# Patient Record
Sex: Female | Born: 1990 | Race: White | Hispanic: No | Marital: Married | Smoking: Never smoker
Health system: Southern US, Community
[De-identification: ages and names within clinical notes are randomized; demographics above are authoritative.]

## PROBLEM LIST (undated history)

## (undated) ENCOUNTER — Emergency Department (HOSPITAL_COMMUNITY): Admission: EM | Discharge status: Absent without leave | Payer: 59

## (undated) DIAGNOSIS — D649 Anemia, unspecified: Secondary | ICD-10-CM

## (undated) DIAGNOSIS — Z9889 Other specified postprocedural states: Secondary | ICD-10-CM

## (undated) DIAGNOSIS — R112 Nausea with vomiting, unspecified: Secondary | ICD-10-CM

## (undated) DIAGNOSIS — N809 Endometriosis, unspecified: Secondary | ICD-10-CM

## (undated) DIAGNOSIS — K219 Gastro-esophageal reflux disease without esophagitis: Secondary | ICD-10-CM

## (undated) DIAGNOSIS — I10 Essential (primary) hypertension: Secondary | ICD-10-CM

## (undated) DIAGNOSIS — R7303 Prediabetes: Secondary | ICD-10-CM

## (undated) DIAGNOSIS — E119 Type 2 diabetes mellitus without complications: Secondary | ICD-10-CM

## (undated) DIAGNOSIS — F419 Anxiety disorder, unspecified: Secondary | ICD-10-CM

## (undated) DIAGNOSIS — J189 Pneumonia, unspecified organism: Secondary | ICD-10-CM

## (undated) DIAGNOSIS — F909 Attention-deficit hyperactivity disorder, unspecified type: Secondary | ICD-10-CM

## (undated) DIAGNOSIS — R000 Tachycardia, unspecified: Secondary | ICD-10-CM

## (undated) DIAGNOSIS — T8859XA Other complications of anesthesia, initial encounter: Secondary | ICD-10-CM

## (undated) DIAGNOSIS — Z87442 Personal history of urinary calculi: Secondary | ICD-10-CM

## (undated) DIAGNOSIS — J45909 Unspecified asthma, uncomplicated: Secondary | ICD-10-CM

## (undated) HISTORY — PX: TYMPANOSTOMY TUBE PLACEMENT: SHX32

## (undated) HISTORY — DX: Essential (primary) hypertension: I10

## (undated) HISTORY — PX: CHOLECYSTECTOMY: SHX55

---
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
## 2011-08-09 DIAGNOSIS — F32A Depression, unspecified: Secondary | ICD-10-CM | POA: Insufficient documentation

## 2017-09-26 DIAGNOSIS — J45901 Unspecified asthma with (acute) exacerbation: Secondary | ICD-10-CM

## 2017-09-26 DIAGNOSIS — J9601 Acute respiratory failure with hypoxia: Secondary | ICD-10-CM

## 2017-09-27 DIAGNOSIS — F419 Anxiety disorder, unspecified: Secondary | ICD-10-CM

## 2017-09-27 DIAGNOSIS — J209 Acute bronchitis, unspecified: Secondary | ICD-10-CM

## 2018-01-10 ENCOUNTER — Emergency Department (HOSPITAL_COMMUNITY)
Admission: EM | Admit: 2018-01-10 | Discharge: 2018-01-10 | Disposition: A | Discharge status: Home / Self Care | Payer: Self-pay | Attending: Emergency Medicine | Admitting: Emergency Medicine

## 2018-01-10 ENCOUNTER — Other Ambulatory Visit: Payer: Self-pay

## 2018-01-10 ENCOUNTER — Encounter (HOSPITAL_COMMUNITY): Payer: Self-pay | Admitting: *Deleted

## 2018-01-10 ENCOUNTER — Emergency Department (HOSPITAL_COMMUNITY): Payer: Self-pay

## 2018-01-10 DIAGNOSIS — N200 Calculus of kidney: Secondary | ICD-10-CM | POA: Insufficient documentation

## 2018-01-10 DIAGNOSIS — R062 Wheezing: Secondary | ICD-10-CM | POA: Insufficient documentation

## 2018-01-10 HISTORY — DX: Unspecified asthma, uncomplicated: J45.909

## 2018-01-10 HISTORY — DX: Tachycardia, unspecified: R00.0

## 2018-01-10 HISTORY — DX: Endometriosis, unspecified: N80.9

## 2018-01-10 LAB — URINALYSIS, ROUTINE W REFLEX MICROSCOPIC
Bilirubin Urine: NEGATIVE
Glucose, UA: NEGATIVE mg/dL
Ketones, ur: NEGATIVE mg/dL
Nitrite: NEGATIVE
PH: 5 (ref 5.0–8.0)
Protein, ur: NEGATIVE mg/dL
SPECIFIC GRAVITY, URINE: 1.03 (ref 1.005–1.030)

## 2018-01-10 LAB — POC URINE PREG, ED: PREG TEST UR: NEGATIVE

## 2018-01-10 MED ORDER — PREDNISONE 20 MG PO TABS
60.0000 mg | ORAL_TABLET | Freq: Once | ORAL | Status: AC
  Administered 2018-01-10: 60 mg via ORAL
  Filled 2018-01-10: qty 3

## 2018-01-10 MED ORDER — ALBUTEROL SULFATE HFA 108 (90 BASE) MCG/ACT IN AERS
2.0000 | INHALATION_SPRAY | Freq: Once | RESPIRATORY_TRACT | Status: AC
  Administered 2018-01-10: 2 via RESPIRATORY_TRACT
  Filled 2018-01-10: qty 6.7

## 2018-01-10 MED ORDER — KETOROLAC TROMETHAMINE 30 MG/ML IJ SOLN
15.0000 mg | Freq: Once | INTRAMUSCULAR | Status: AC
  Administered 2018-01-10: 15 mg via INTRAMUSCULAR
  Filled 2018-01-10: qty 1

## 2018-01-10 MED ORDER — TRAMADOL HCL 50 MG PO TABS: 50.0000 mg | ORAL_TABLET | Freq: Four times a day (QID) | ORAL | 0 refills | Status: DC | PRN

## 2018-01-10 MED ORDER — PREDNISONE 20 MG PO TABS: 40.0000 mg | ORAL_TABLET | Freq: Every day | ORAL | 0 refills | Status: DC

## 2018-01-10 MED ORDER — ALBUTEROL SULFATE (2.5 MG/3ML) 0.083% IN NEBU
5.0000 mg | INHALATION_SOLUTION | Freq: Once | RESPIRATORY_TRACT | Status: AC
  Administered 2018-01-10: 5 mg via RESPIRATORY_TRACT
  Filled 2018-01-10: qty 6

## 2018-01-10 NOTE — ED Notes (Signed)
Pt verbalized understanding of d/c instructions and has no further questions. VSS, NAD. Pt removed all belongings.  

## 2018-01-10 NOTE — ED Provider Notes (Signed)
[facility] EMERGENCY DEPARTMENT Provider Note   CSN: [id number] Arrival date & time: 01/10/18  0709     History   Chief Complaint Chief Complaint  Patient presents with  . Back Pain  . Dysuria    HPI Tina Manning is a 27 y.o. female.  HPI Patient presents with concern of right flank pain. There is associated dysuria, nausea, vomiting. Onset was about 1-1/2 days ago, possibly 2. Prior to the onset of symptoms, the patient was in her usual state of health. She acknowledges multiple medical issues including asthma, endometriosis. Her menstrual cycle is irregular, regularly. She is unable to specify when her last period began and stopped. However, she states this is not unusual.  Prior to the onset of symptoms, the patient has not had similar flank pain in some time, though she does acknowledge a history of prior kidney stone and kidney infection, states that these symptoms are similar.  On review of systems, patient also describes mild dyspnea, states that she ran out of her albuterol inhaler. She denies fever, chest pain. She is here with her father who assists with the HPI. Past Medical History:  Diagnosis Date  . Asthma   . Endometriosis   . Tachycardia     There are no active problems to display for this patient.   Past Surgical History:  Procedure Laterality Date  . CHOLECYSTECTOMY      OB History    No data available       Home Medications    Prior to Admission medications   Not on File    Family History No family history on file.  Social History Social History   Tobacco Use  . Smoking status: Not on file  Substance Use Topics  . Alcohol use: Not on file  . Drug use: Not on file     Allergies   Penicillins   Review of Systems Review of Systems  Constitutional:       Per HPI, otherwise negative  HENT:       Per HPI, otherwise negative  Respiratory:       Per HPI, otherwise negative    Cardiovascular:       Per HPI, otherwise negative  Gastrointestinal: Positive for nausea and vomiting.  Endocrine:       Negative aside from HPI  Genitourinary:       Neg aside from HPI   Musculoskeletal:       Per HPI, otherwise negative  Skin: Negative.   Neurological: Negative for syncope.     Physical Exam Updated Vital Signs BP (!) 149/102 (BP Location: Right Arm)   Pulse (!) 122   Temp 98.7 F (37.1 C) (Oral)   Resp 18   LMP 01/08/2018 (Exact Date)   SpO2 98%   Physical Exam  Constitutional: She is oriented to person, place, and time. She appears well-developed and well-nourished. No distress.  Obese young female awake and alert sitting upright  HENT:  Head: Normocephalic and atraumatic.  Eyes: Conjunctivae and EOM are normal.  Cardiovascular: Regular rhythm. Tachycardia present.  Pulmonary/Chest: No stridor. She has decreased breath sounds. She has wheezes.  Abdominal: She exhibits no distension.  Right flank tenderness, otherwise no abdominal pain  Musculoskeletal: She exhibits no edema.  Neurological: She is alert and oriented to person, place, and time. No cranial nerve deficit.  Skin: Skin is warm and dry.  Psychiatric: She has a normal mood and affect.  Nursing note and vitals reviewed.  ED Treatments / Results  Labs (all labs ordered are listed, but only abnormal results are displayed) Labs Reviewed  URINALYSIS, ROUTINE W REFLEX MICROSCOPIC - Abnormal; Notable for the following components:      Result Value   APPearance HAZY (*)    Hgb urine dipstick LARGE (*)    Leukocytes, UA TRACE (*)    Bacteria, UA FEW (*)    Squamous Epithelial / LPF 0-5 (*)    All other components within normal limits  POC URINE PREG, ED    Radiology [place] Renal  Result Date: 01/10/2018 CLINICAL DATA:  Right flank pain. EXAM: RENAL / URINARY TRACT ULTRASOUND COMPLETE COMPARISON:  08/01/2011. FINDINGS: Right Kidney: Length: 12.2 cm. Echogenicity within normal limits. No  mass or hydronephrosis visualized. Left Kidney: Length: 12.1 cm. Echogenicity within normal limits. No mass or hydronephrosis visualized. Bladder: Appears normal for degree of bladder distention. Limited exam due to patient's body habitus. IMPRESSION: No acute or focal abnormality identified. Electronically Signed   By: Maisie Fushomas  Register   On: 01/10/2018 10:59    Procedures Procedures (including critical care time)  Medications Ordered in ED Medications  ketorolac (TORADOL) 30 MG/ML injection 15 mg (15 mg Intramuscular Given 01/10/18 0840)  albuterol (PROVENTIL) (2.5 MG/3ML) 0.083% nebulizer solution 5 mg (5 mg Nebulization Given 01/10/18 0840)  albuterol (PROVENTIL HFA;VENTOLIN HFA) 108 (90 Base) MCG/ACT inhaler 2 puff (2 puffs Inhalation Given 01/10/18 0840)  predniSONE (DELTASONE) tablet 60 mg (60 mg Oral Given 01/10/18 0839)     Initial Impression / Assessment and Plan / ED Course  I have reviewed the triage vital signs and the nursing notes.  Pertinent labs & imaging results that were available during my care of the patient were reviewed by me and considered in my medical decision making (see chart for details).    11:25 AM Patient awake alert, in no distress per Patient with her about findings, including evidence for kidney stone, particularly given her history, but no evidence for obstruction or infection. Patient has diminished pain, no new complaints, no fever, and remains hemodynamically unremarkable aside from mild tachycardia. Patient will follow up with her primary care physician, voices understanding of return precautions.   Final Clinical Impressions(s) / ED Diagnoses  Kidney stone Asthma exacerbation   Gerhard MunchLockwood, Liylah Najarro, MD 01/10/18 1126

## 2018-01-10 NOTE — ED Triage Notes (Signed)
Pt is here with right lower back pain for 1.5days and reports pain with urination.  Pt reports nausea and vomiting X1 last night.  LMP 2 days ago. Pt states pain with movement

## 2018-01-10 NOTE — Discharge Instructions (Signed)
As discussed, your evaluation today has been largely reassuring.  But, it is important that you monitor your condition carefully, and do not hesitate to return to the ED if you develop new, or concerning changes in your condition. ? ?Otherwise, please follow-up with your physician for appropriate ongoing care. ? ?

## 2018-02-27 ENCOUNTER — Emergency Department (HOSPITAL_COMMUNITY)
Admission: EM | Admit: 2018-02-27 | Discharge: 2018-02-27 | Disposition: A | Discharge status: Home / Self Care | Payer: Medicaid Other | Attending: Emergency Medicine | Admitting: Emergency Medicine

## 2018-02-27 ENCOUNTER — Emergency Department (HOSPITAL_COMMUNITY): Payer: Medicaid Other

## 2018-02-27 ENCOUNTER — Encounter (HOSPITAL_COMMUNITY): Payer: Self-pay | Admitting: Emergency Medicine

## 2018-02-27 ENCOUNTER — Other Ambulatory Visit: Payer: Self-pay

## 2018-02-27 DIAGNOSIS — H6642 Suppurative otitis media, unspecified, left ear: Secondary | ICD-10-CM | POA: Insufficient documentation

## 2018-02-27 DIAGNOSIS — H66002 Acute suppurative otitis media without spontaneous rupture of ear drum, left ear: Secondary | ICD-10-CM

## 2018-02-27 DIAGNOSIS — J45901 Unspecified asthma with (acute) exacerbation: Secondary | ICD-10-CM

## 2018-02-27 LAB — CBC WITH DIFFERENTIAL/PLATELET
BASOS PCT: 0 %
Basophils Absolute: 0 10*3/uL (ref 0.0–0.1)
EOS ABS: 0.4 10*3/uL (ref 0.0–0.7)
EOS PCT: 3 %
HCT: 39.8 % (ref 36.0–46.0)
HEMOGLOBIN: 12.7 g/dL (ref 12.0–15.0)
LYMPHS ABS: 2.5 10*3/uL (ref 0.7–4.0)
Lymphocytes Relative: 22 %
MCH: 27.7 pg (ref 26.0–34.0)
MCHC: 31.9 g/dL (ref 30.0–36.0)
MCV: 86.9 fL (ref 78.0–100.0)
Monocytes Absolute: 0.6 10*3/uL (ref 0.1–1.0)
Monocytes Relative: 5 %
Neutro Abs: 7.6 10*3/uL (ref 1.7–7.7)
Neutrophils Relative %: 70 %
PLATELETS: 376 10*3/uL (ref 150–400)
RBC: 4.58 MIL/uL (ref 3.87–5.11)
RDW: 14.6 % (ref 11.5–15.5)
WBC: 11.1 10*3/uL — AB (ref 4.0–10.5)

## 2018-02-27 LAB — BASIC METABOLIC PANEL
Anion gap: 12 (ref 5–15)
BUN: 13 mg/dL (ref 6–20)
CO2: 23 mmol/L (ref 22–32)
CREATININE: 0.74 mg/dL (ref 0.44–1.00)
Calcium: 9 mg/dL (ref 8.9–10.3)
Chloride: 107 mmol/L (ref 101–111)
Glucose, Bld: 103 mg/dL — ABNORMAL HIGH (ref 65–99)
Potassium: 3.6 mmol/L (ref 3.5–5.1)
SODIUM: 142 mmol/L (ref 135–145)

## 2018-02-27 LAB — I-STAT CG4 LACTIC ACID, ED: LACTIC ACID, VENOUS: 1.14 mmol/L (ref 0.5–1.9)

## 2018-02-27 MED ORDER — ALBUTEROL SULFATE (2.5 MG/3ML) 0.083% IN NEBU
5.0000 mg | INHALATION_SOLUTION | Freq: Once | RESPIRATORY_TRACT | Status: AC
  Administered 2018-02-27: 5 mg via RESPIRATORY_TRACT
  Filled 2018-02-27: qty 6

## 2018-02-27 MED ORDER — PREDNISONE 20 MG PO TABS
60.0000 mg | ORAL_TABLET | Freq: Once | ORAL | Status: AC
  Administered 2018-02-27: 60 mg via ORAL
  Filled 2018-02-27: qty 3

## 2018-02-27 MED ORDER — AZITHROMYCIN 250 MG PO TABS: 250.0000 mg | ORAL_TABLET | Freq: Every day | ORAL | 0 refills | Status: DC

## 2018-02-27 MED ORDER — PREDNISONE 20 MG PO TABS: 60.0000 mg | ORAL_TABLET | Freq: Every day | ORAL | 0 refills | Status: DC

## 2018-02-27 MED ORDER — ALBUTEROL SULFATE HFA 108 (90 BASE) MCG/ACT IN AERS: 1.0000 | INHALATION_SPRAY | Freq: Four times a day (QID) | RESPIRATORY_TRACT | 0 refills | Status: DC | PRN

## 2018-02-27 MED ORDER — ALBUTEROL SULFATE (2.5 MG/3ML) 0.083% IN NEBU
2.5000 mg | INHALATION_SOLUTION | Freq: Once | RESPIRATORY_TRACT | Status: AC
  Administered 2018-02-27: 2.5 mg via RESPIRATORY_TRACT
  Filled 2018-02-27: qty 3

## 2018-02-27 MED ORDER — CETIRIZINE-PSEUDOEPHEDRINE ER 5-120 MG PO TB12: 1.0000 | ORAL_TABLET | Freq: Every day | ORAL | 0 refills | Status: DC

## 2018-02-27 MED ORDER — BENZONATATE 100 MG PO CAPS: 100.0000 mg | ORAL_CAPSULE | Freq: Three times a day (TID) | ORAL | 0 refills | Status: DC

## 2018-02-27 NOTE — ED Notes (Signed)
Walked patient around the nursing station patient did ok oxygen level stayed at 100 then went down to 91 as she walked

## 2018-02-27 NOTE — ED Provider Notes (Signed)
[facility] EMERGENCY DEPARTMENT Provider Note   CSN: [id number] Arrival date & time: 02/27/18  0701     History   Chief Complaint Chief Complaint  Patient presents with  . Influenza    HPI Tina Manning is a 27 y.o. female with history of asthma who presents with a 3-day history of cough, sore throat, left ear pain, and shortness of breath with exertion.  Patient has had wheezing and shortness of breath unresolved with her inhaler at home.  She has been taking over-the-counter cough medications without significant relief.  She denies any abdominal pain, nausea, vomiting.  She reports she has felt hot and cold for the past few days, but no documented fever at home.  HPI  Past Medical History:  Diagnosis Date  . Asthma   . Endometriosis   . Tachycardia     There are no active problems to display for this patient.   Past Surgical History:  Procedure Laterality Date  . CHOLECYSTECTOMY      OB History    No data available       Home Medications    Prior to Admission medications   Medication Sig Start Date End Date Taking? Authorizing Provider  albuterol (PROVENTIL HFA;VENTOLIN HFA) 108 (90 Base) MCG/ACT inhaler Inhale 1-2 puffs into the lungs every 6 (six) hours as needed for wheezing or shortness of breath. 02/27/18   Devian Bartolomei, Waylan Boga, PA-C  azithromycin (ZITHROMAX) 250 MG tablet Take 1 tablet (250 mg total) by mouth daily. Take first 2 tablets together, then 1 every day until finished. 02/27/18   Skip Litke, Waylan Boga, PA-C  benzonatate (TESSALON) 100 MG capsule Take 1 capsule (100 mg total) by mouth every 8 (eight) hours. 02/27/18   Lord Lancour, Waylan Boga, PA-C  cetirizine-pseudoephedrine (ZYRTEC-D) 5-120 MG tablet Take 1 tablet by mouth daily. 02/27/18   Audyn Dimercurio, Waylan Boga, PA-C  predniSONE (DELTASONE) 20 MG tablet Take 3 tablets (60 mg total) by mouth daily. 02/27/18   Tyler Robidoux, Waylan Boga, PA-C  traMADol (ULTRAM) 50 MG tablet Take 1 tablet (50 mg total) by mouth  every 6 (six) hours as needed. 01/10/18   Gerhard Munch, MD    Family History History reviewed. No pertinent family history.  Social History Social History   Tobacco Use  . Smoking status: Never Smoker  . Smokeless tobacco: Never Used  Substance Use Topics  . Alcohol use: No    Frequency: Never  . Drug use: No     Allergies   Penicillins   Review of Systems Review of Systems  Constitutional: Positive for chills. Negative for fever.  HENT: Positive for ear pain and sore throat. Negative for facial swelling.   Respiratory: Positive for cough, shortness of breath and wheezing.   Cardiovascular: Negative for chest pain.  Gastrointestinal: Negative for abdominal pain, nausea and vomiting.  Genitourinary: Negative for dysuria.  Musculoskeletal: Negative for back pain.  Skin: Negative for rash and wound.  Neurological: Negative for headaches.  Psychiatric/Behavioral: The patient is not nervous/anxious.      Physical Exam Updated Vital Signs BP (!) 143/81   Pulse 100   Temp 98.1 F (36.7 C) (Oral)   Resp 14   Ht 5\' 6"  (1.676 m)   Wt (!) 137 kg (302 lb)   LMP 02/26/2018   SpO2 98%   BMI 48.74 kg/m   Physical Exam  Constitutional: She appears well-developed and well-nourished. No distress.  HENT:  Head: Normocephalic and atraumatic.  Right Ear: Tympanic membrane normal.  No mastoid tenderness.  Left Ear: No mastoid tenderness. Tympanic membrane is injected, erythematous and bulging (mild).  Mouth/Throat: Mucous membranes are normal. No trismus in the jaw. Posterior oropharyngeal edema and posterior oropharyngeal erythema present. No oropharyngeal exudate or tonsillar abscesses. Tonsils are 1+ on the right. Tonsils are 1+ on the left. No tonsillar exudate.  Eyes: Conjunctivae are normal. Pupils are equal, round, and reactive to light. Right eye exhibits no discharge. Left eye exhibits no discharge. No scleral icterus.  Neck: Normal range of motion. Neck supple. No  thyromegaly present.  Cardiovascular: Normal rate, regular rhythm, normal heart sounds and intact distal pulses. Exam reveals no gallop and no friction rub.  No murmur heard. Pulmonary/Chest: Effort normal. No stridor. No respiratory distress. She has wheezes. She has no rales.  Inspiratory and expiratory wheezes bilaterally  Abdominal: Soft. Bowel sounds are normal. She exhibits no distension. There is no tenderness. There is no rebound and no guarding.  Musculoskeletal: She exhibits no edema.  Lymphadenopathy:    She has cervical adenopathy (L, moble < 1 cm).  Neurological: She is alert. Coordination normal.  Skin: Skin is warm and dry. No rash noted. She is not diaphoretic. No pallor.  Psychiatric: She has a normal mood and affect.  Nursing note and vitals reviewed.    ED Treatments / Results  Labs (all labs ordered are listed, but only abnormal results are displayed) Labs Reviewed  CBC WITH DIFFERENTIAL/PLATELET - Abnormal; Notable for the following components:      Result Value   WBC 11.1 (*)    All other components within normal limits  BASIC METABOLIC PANEL - Abnormal; Notable for the following components:   Glucose, Bld 103 (*)    All other components within normal limits  I-STAT CG4 LACTIC ACID, ED    EKG  EKG Interpretation None       Radiology Dg Chest 2 View  Result Date: 02/27/2018 CLINICAL DATA:  Flu like symptoms, shortness of breath, sore throat, and ear pain for the past week. History of asthma. EXAM: CHEST - 2 VIEW COMPARISON:  Chest x-ray of September 21, 2017 and chest CT scan of September 26, 2017. FINDINGS: The lungs are mildly hypoinflated. There is no focal infiltrate. There is no pleural effusion. The heart and pulmonary vascularity are normal. The mediastinum is normal in width. The bony thorax exhibits no acute abnormality. IMPRESSION: There is no active cardiopulmonary disease. Electronically Signed   By: David  [place] M.D.   On: 02/27/2018 07:59     Procedures Procedures (including critical care time)  Medications Ordered in ED Medications  albuterol (PROVENTIL) (2.5 MG/3ML) 0.083% nebulizer solution 2.5 mg (2.5 mg Nebulization Given 02/27/18 0715)  albuterol (PROVENTIL) (2.5 MG/3ML) 0.083% nebulizer solution 5 mg (5 mg Nebulization Given 02/27/18 1012)  predniSONE (DELTASONE) tablet 60 mg (60 mg Oral Given 02/27/18 1012)     Initial Impression / Assessment and Plan / ED Course  I have reviewed the triage vital signs and the nursing notes.  Pertinent labs & imaging results that were available during my care of the patient were reviewed by me and considered in my medical decision making (see chart for details).     Patient with suspected viral URI with asthma exacerbation.  Patient also has left otitis media.  Mild leukocytosis, 11.1.  Otherwise, labs unremarkable.  Chest x-ray is negative.  Patient's wheezing is resolved after 2 albuterol nebulizer treatments in the ED.  She is ambulatory without significant shortness of breath and oxygen  saturations above 90%.   Will discharge home with 5-day prednisone burst, first dose given in the ED, as well as azithromycin for otitis media, as patient is allergic to penicillin.  Patient advised to follow-up with her PCP for recheck.  Return precautions discussed.  Patient understands and agrees with plan.  Patient vitals stable and discharged in satisfactory condition.  Final Clinical Impressions(s) / ED Diagnoses   Final diagnoses:  Exacerbation of asthma, unspecified asthma severity, unspecified whether persistent  Acute suppurative otitis media of left ear without spontaneous rupture of tympanic membrane, recurrence not specified    ED Discharge Orders        Ordered    predniSONE (DELTASONE) 20 MG tablet  Daily     02/27/18 1246    benzonatate (TESSALON) 100 MG capsule  Every 8 hours     02/27/18 1246    albuterol (PROVENTIL HFA;VENTOLIN HFA) 108 (90 Base) MCG/ACT inhaler  Every 6  hours PRN     02/27/18 1246    cetirizine-pseudoephedrine (ZYRTEC-D) 5-120 MG tablet  Daily     02/27/18 1246    azithromycin (ZITHROMAX) 250 MG tablet  Daily     02/27/18 1246       Emi HolesLaw, Saulo Anthis M, PA-C 02/27/18 1319    Cathren LaineSteinl, Kevin, MD 02/27/18 1354

## 2018-02-27 NOTE — ED Triage Notes (Signed)
Pt c/o flu like symptoms for the past 3 days with sore throat, ear pain and SOB getting worse today, pt using inhaler with no relief.

## 2018-02-27 NOTE — Discharge Instructions (Signed)
Medications: Prednisone, azithromycin, Zyrtec-D, Tessalon  Treatment: Take prednisone once daily for 4 days.  Take azithromycin once daily for 5 days. Take Zyrtec-D twice daily as needed for nasal and ear congestion. Take Tessalon every 8 hours as needed for cough.  Follow-up: Please follow-up with your PCP for recheck in 3-4 days.  Please return to the emergency department if you develop any new or worsening symptoms including significant worsening of shortness of breath, persistent fever over 100.4, severe chest pain, or any other new or concerning symptom.

## 2018-04-10 ENCOUNTER — Emergency Department (HOSPITAL_COMMUNITY): Payer: Self-pay

## 2018-04-10 ENCOUNTER — Emergency Department (HOSPITAL_COMMUNITY)
Admission: EM | Admit: 2018-04-10 | Discharge: 2018-04-10 | Disposition: A | Discharge status: Home / Self Care | Payer: Self-pay | Attending: Emergency Medicine | Admitting: Emergency Medicine

## 2018-04-10 ENCOUNTER — Encounter (HOSPITAL_COMMUNITY): Payer: Self-pay | Admitting: *Deleted

## 2018-04-10 ENCOUNTER — Other Ambulatory Visit: Payer: Self-pay

## 2018-04-10 DIAGNOSIS — Z5321 Procedure and treatment not carried out due to patient leaving prior to being seen by health care provider: Secondary | ICD-10-CM | POA: Insufficient documentation

## 2018-04-10 DIAGNOSIS — R062 Wheezing: Secondary | ICD-10-CM | POA: Insufficient documentation

## 2018-04-10 LAB — I-STAT BETA HCG BLOOD, ED (MC, WL, AP ONLY)

## 2018-04-10 NOTE — ED Triage Notes (Signed)
The pt is c/o  Loosing her inhaler  And she has chest tightness.  She is presently   Hyperventilating  lmp  now

## 2018-04-10 NOTE — ED Notes (Signed)
Unable to locate pt x 3

## 2018-04-10 NOTE — ED Notes (Signed)
No audible wheezes

## 2019-03-28 IMAGING — CR DG CHEST 2V
2 series · 2 of 2 positions shown · non-contrast
Comparison: Chest x-ray of September 21, 2017 and chest CT scan of
September 26, 2017.

CLINICAL DATA: Flu like symptoms, shortness of breath, sore throat,
and ear pain for the past week. History of asthma.

EXAM:
CHEST - 2 VIEW

[chest pa]
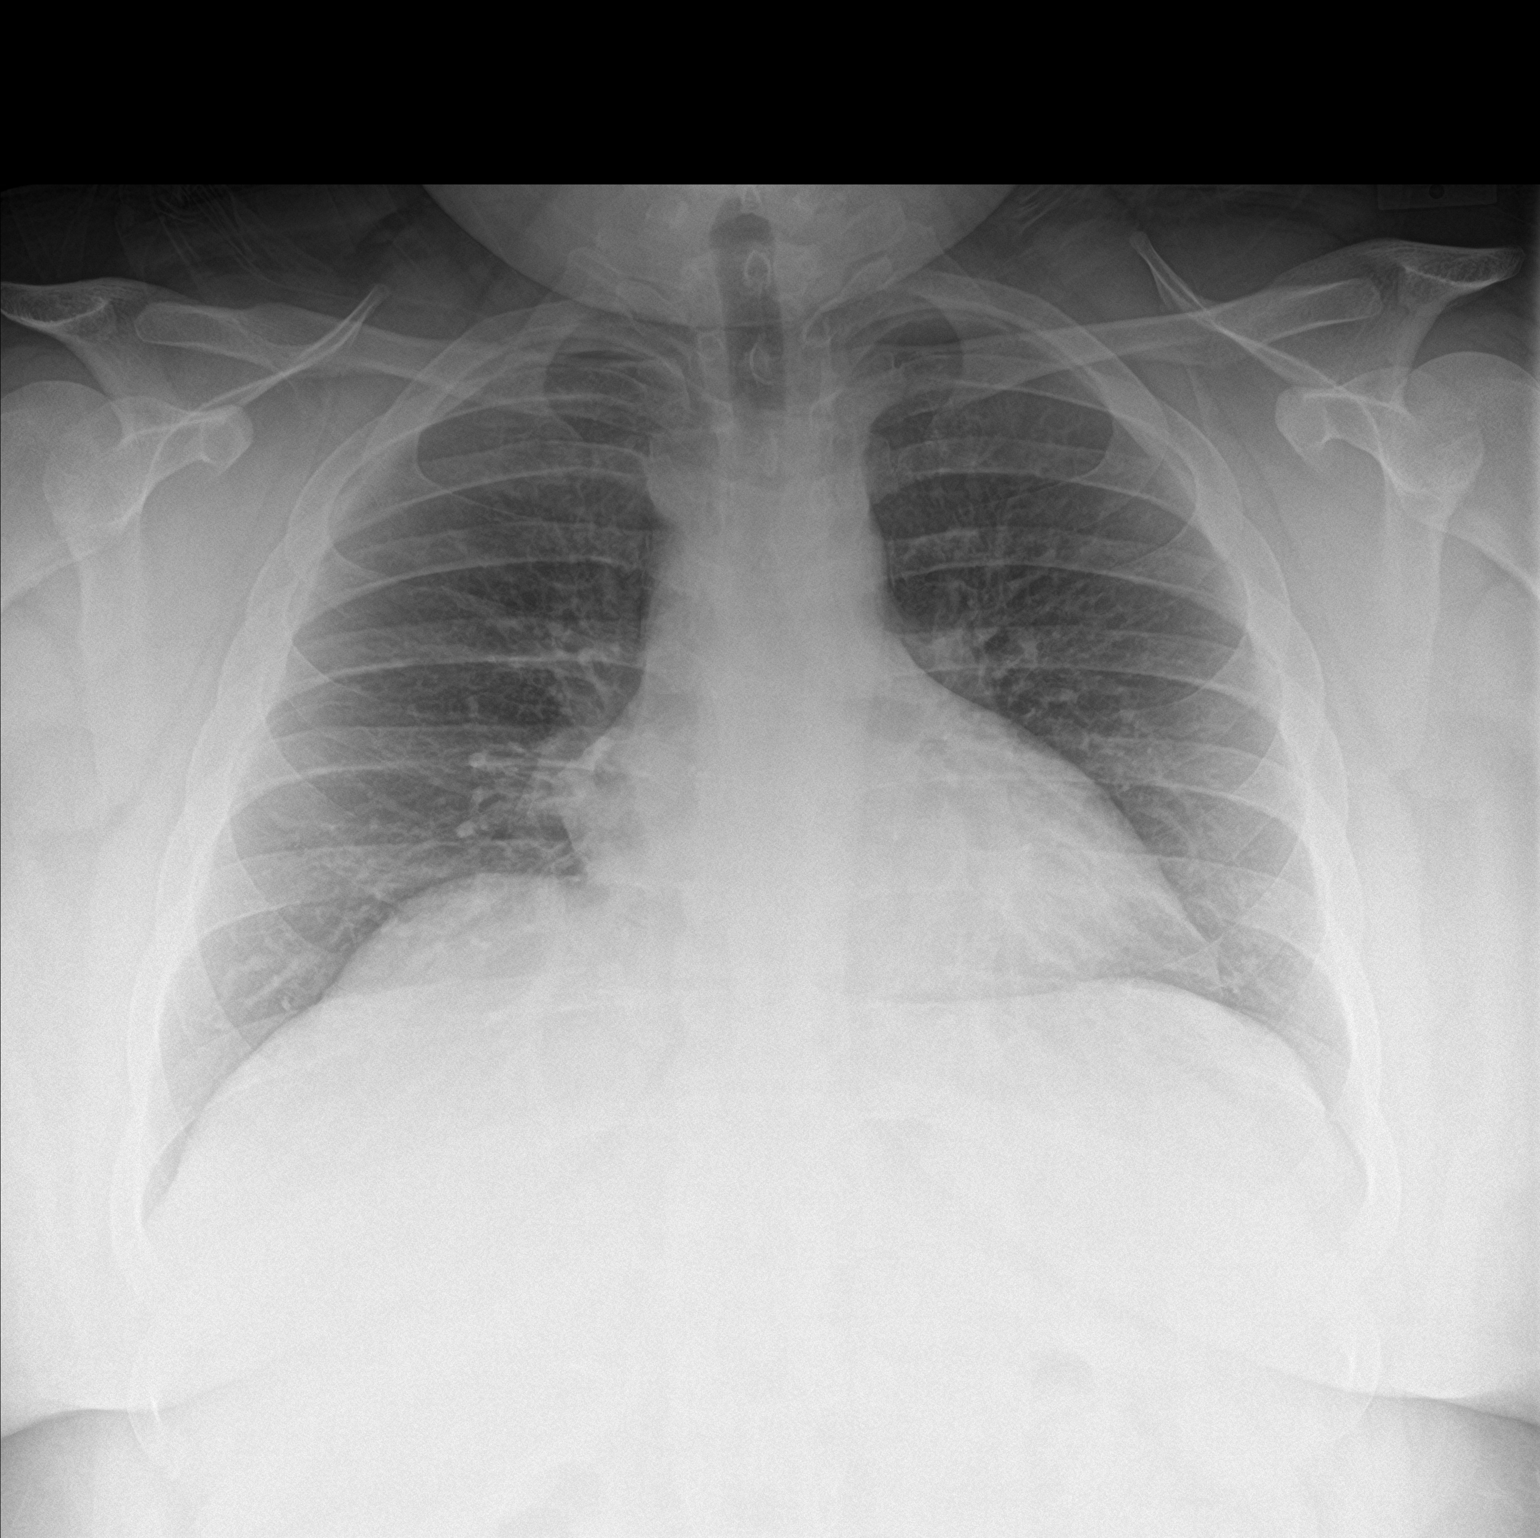

[chest lat]
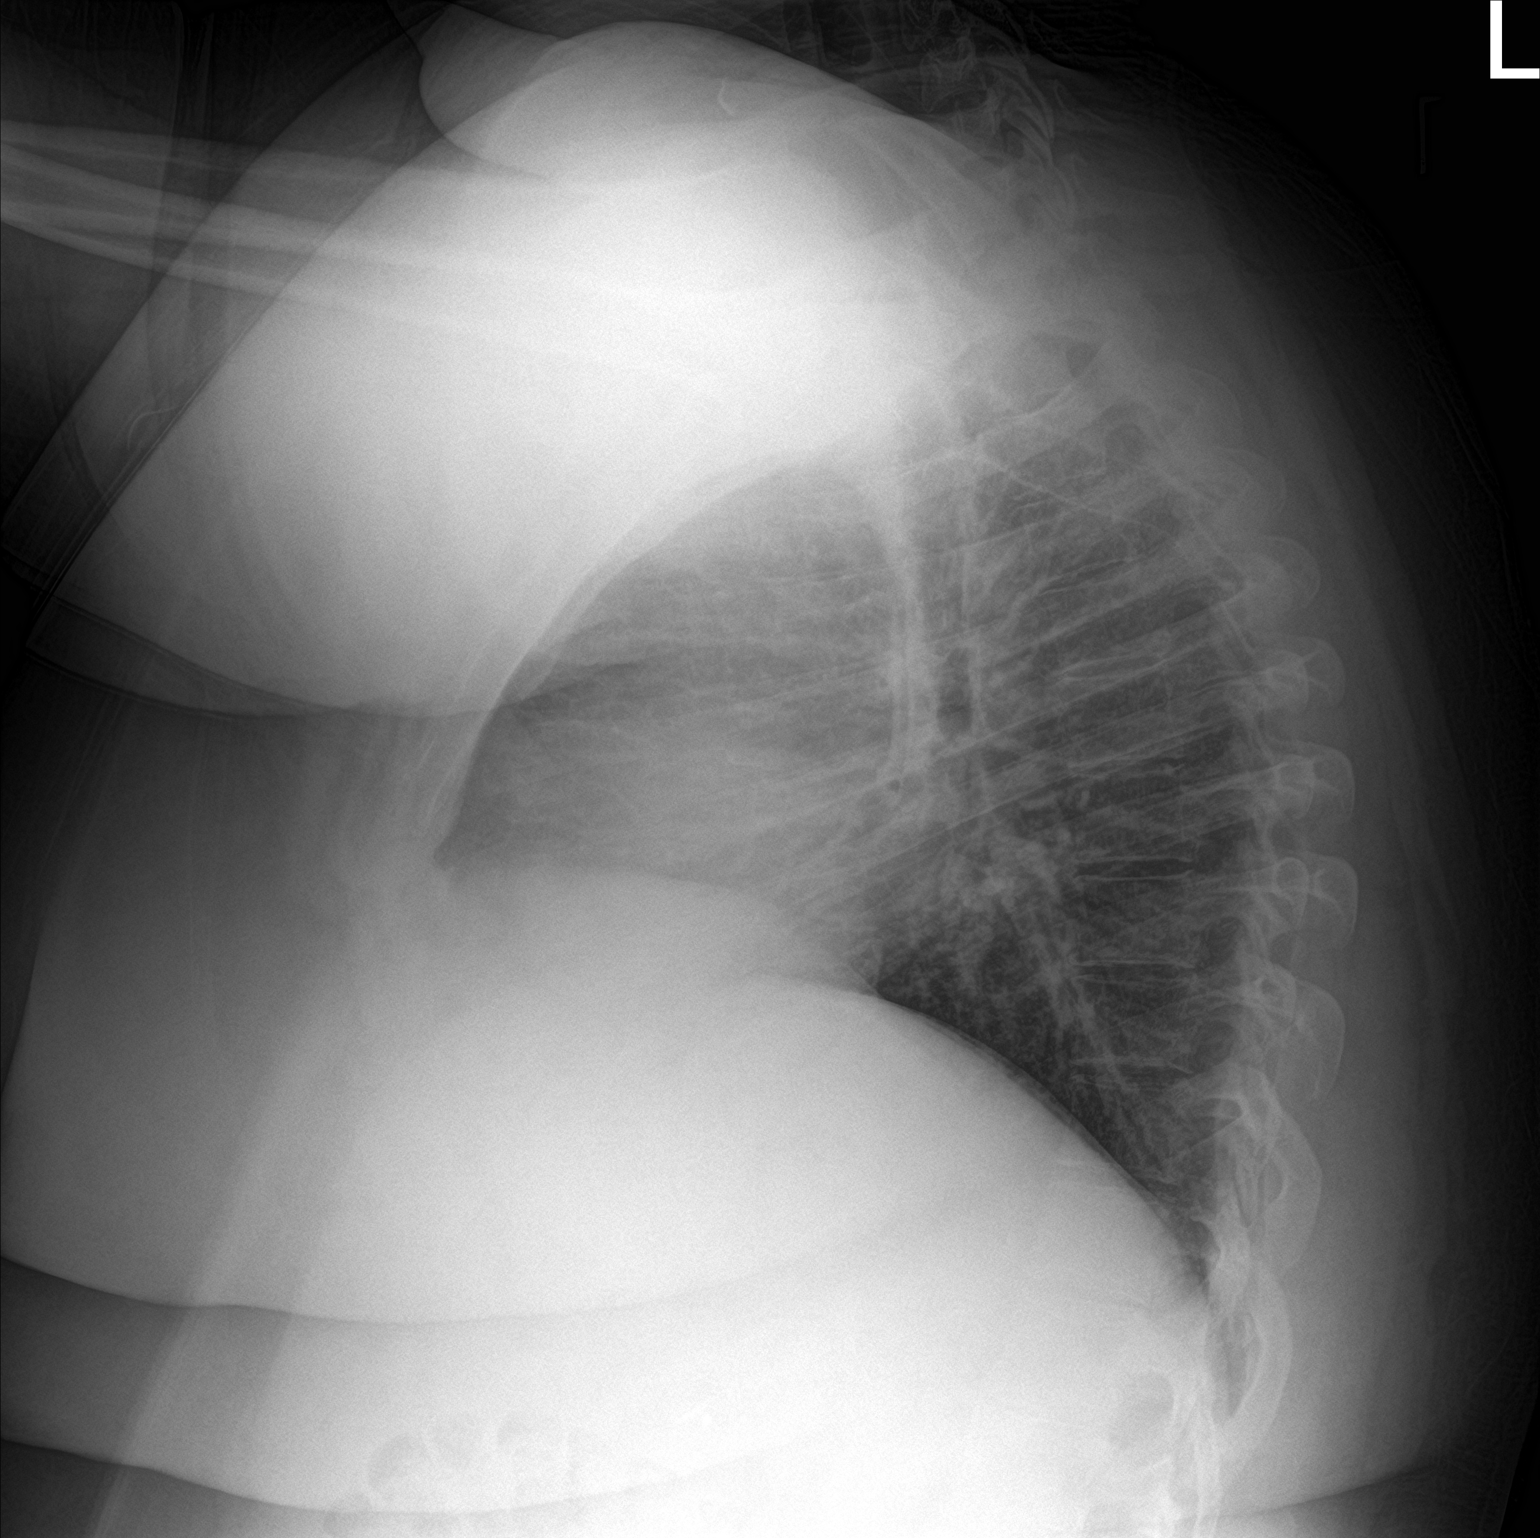

[2 of 2 positions shown; findings below may reference images not displayed]

FINDINGS: The lungs are mildly hypoinflated. There is no focal infiltrate.
There is no pleural effusion. The heart and pulmonary vascularity
are normal. The mediastinum is normal in width. The bony thorax
exhibits no acute abnormality.
IMPRESSION: There is no active cardiopulmonary disease.

## 2019-04-13 IMAGING — US US RENAL
1 series · 14 of 25 positions shown · non-contrast
Comparison: 08/01/2011.

CLINICAL DATA: Right flank pain.

EXAM:
RENAL / URINARY TRACT ULTRASOUND COMPLETE

[Series 1: us renal · 0.27mm/px · 14 of 27 slices shown]
[im 1/27]
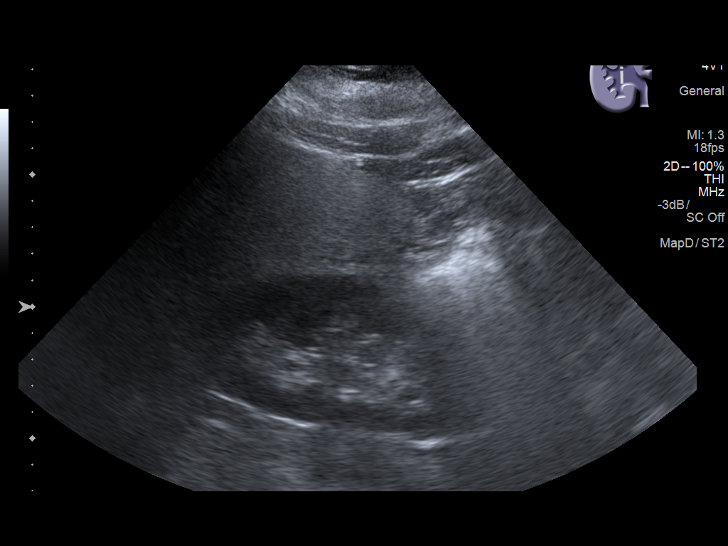
[im 3/27]
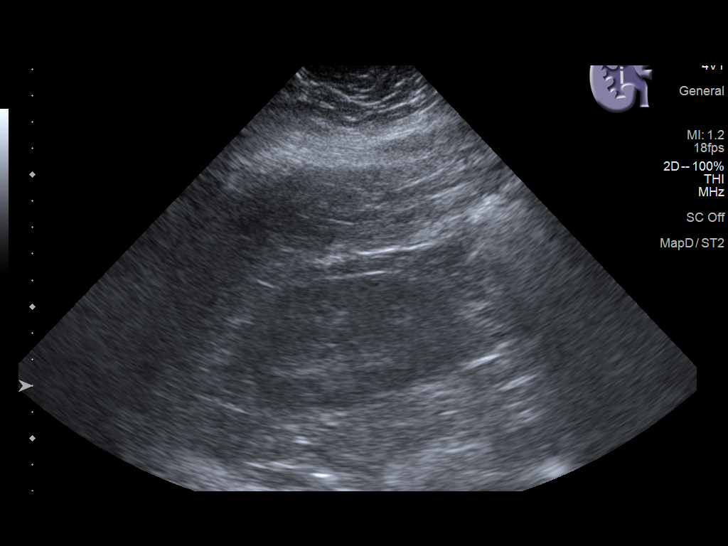
[im 5/27]
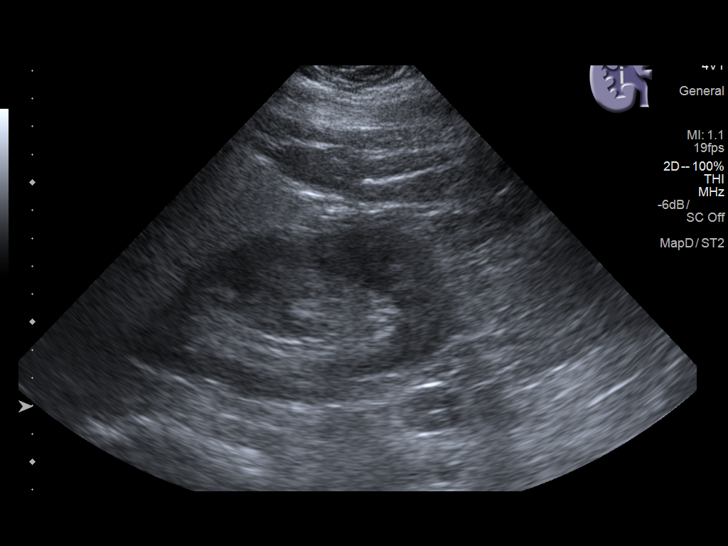
[im 7/27]
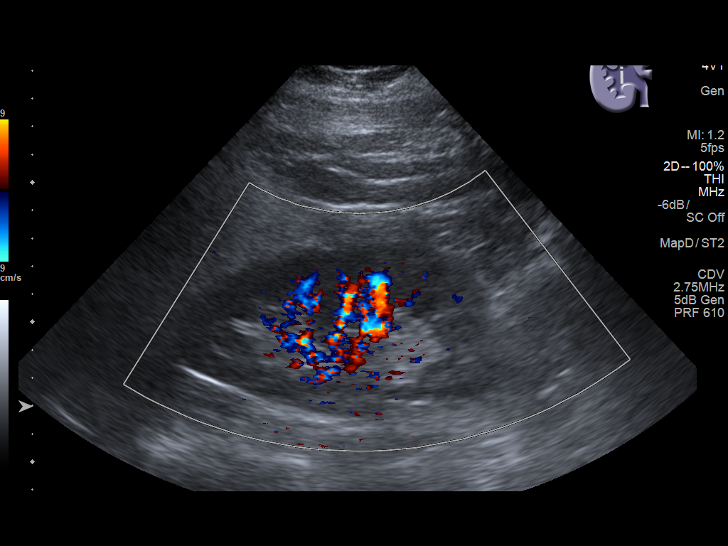
[im 9/27]
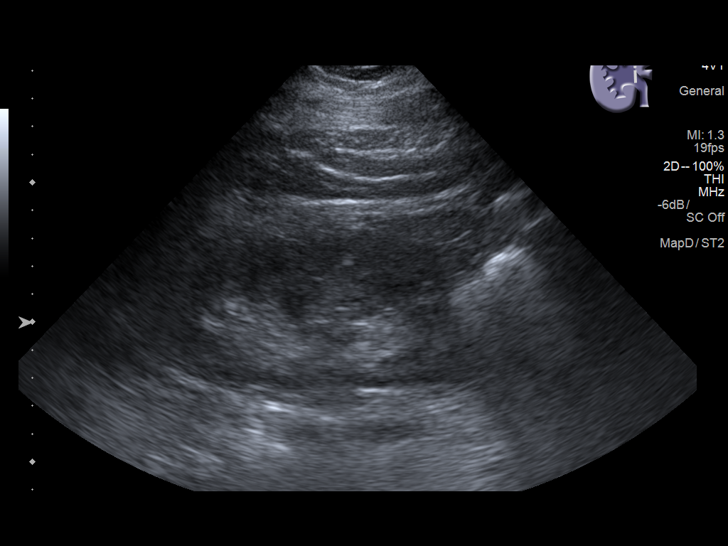
[im 10/27]
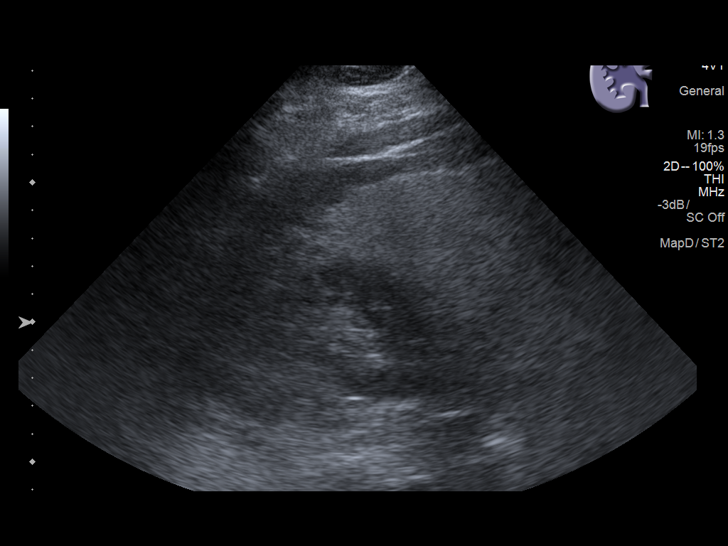
[im 12/27]
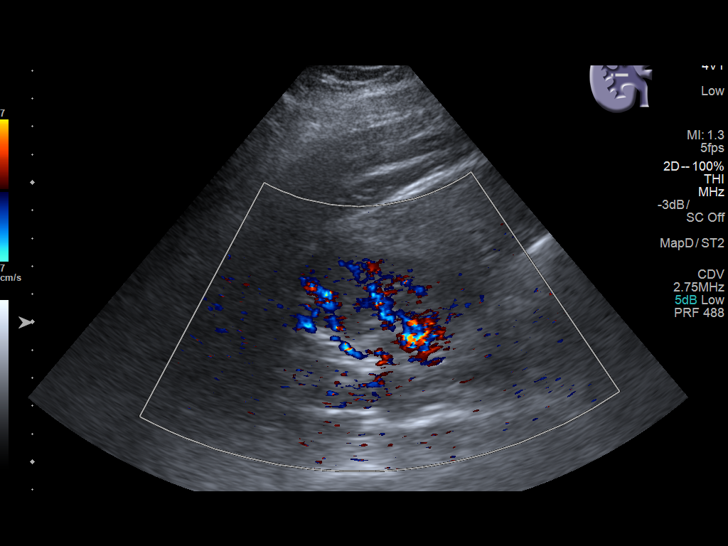
[im 15/27]
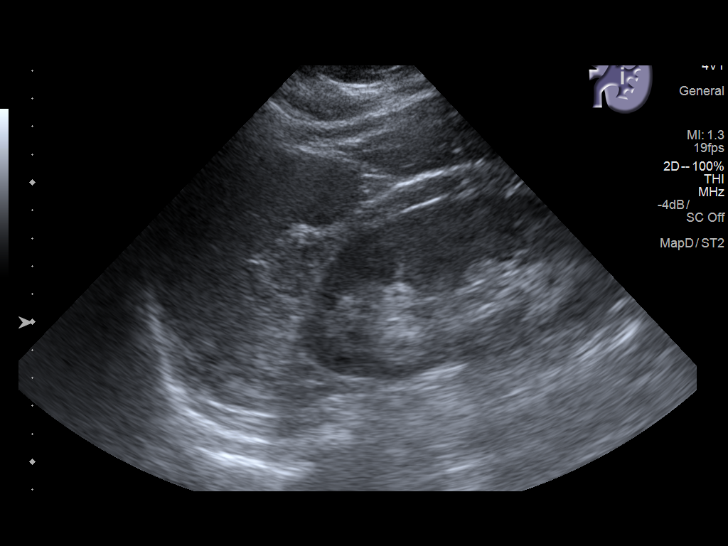
[im 17/27]
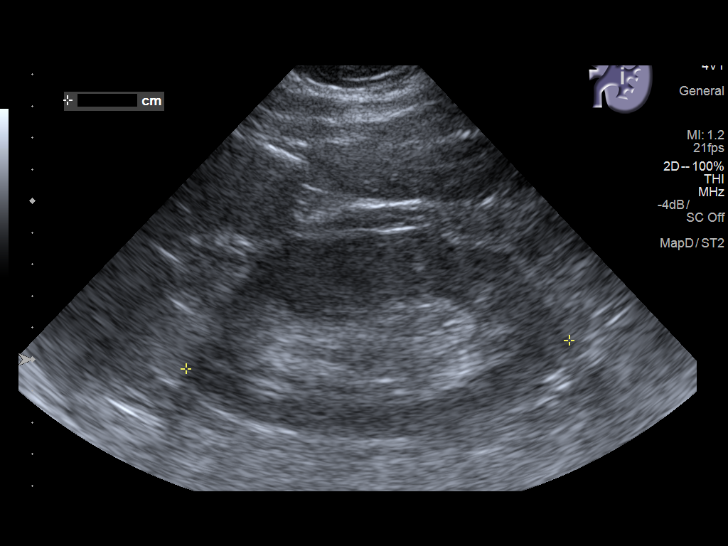
[im 18/27]
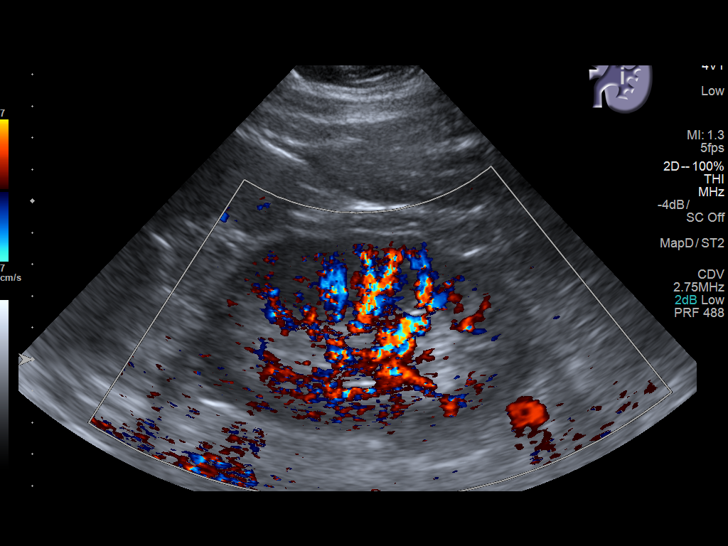
[im 20/27]
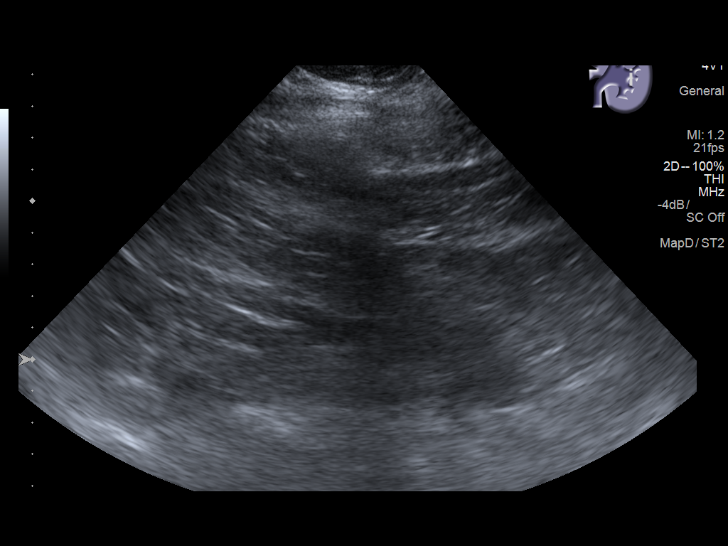
[im 22/27]
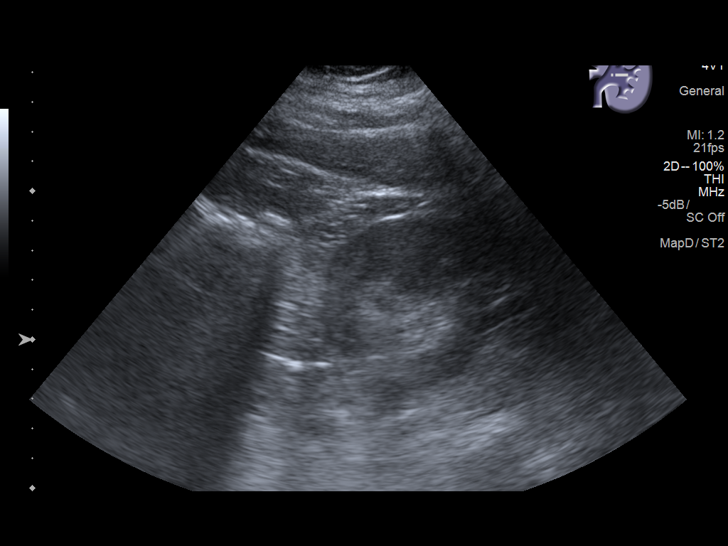
[im 24/27]
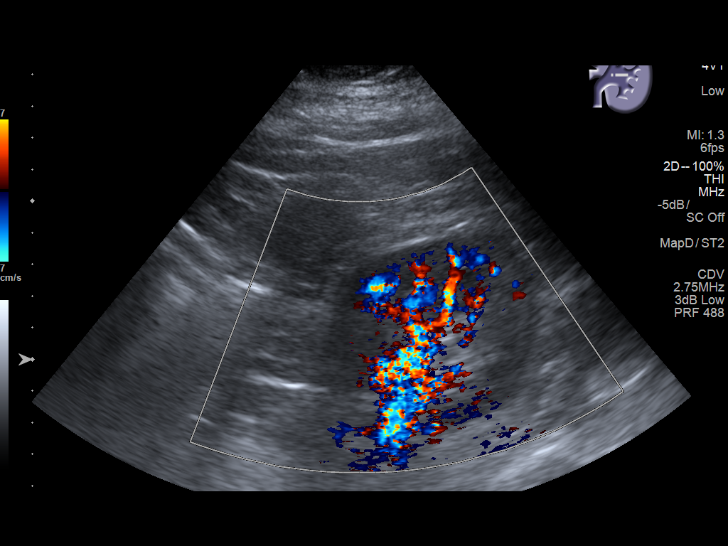
[im 27/27]
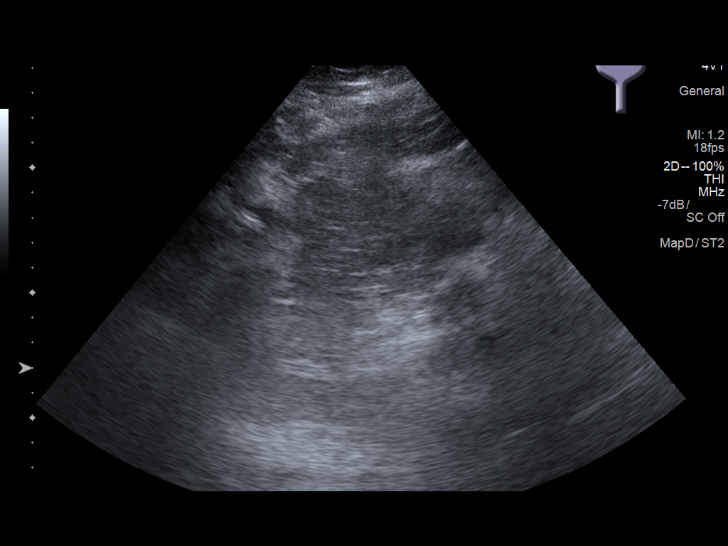

[14 of 25 positions shown; findings below may reference images not displayed]

FINDINGS: Right Kidney:

Length: 12.2 cm. Echogenicity within normal limits. No mass or
hydronephrosis visualized.

Left Kidney:

Length: 12.1 cm. Echogenicity within normal limits. No mass or
hydronephrosis visualized.

Bladder:

Appears normal for degree of bladder distention. Limited exam due to
patient's body habitus.
IMPRESSION: No acute or focal abnormality identified.

## 2019-05-09 IMAGING — DX DG CHEST 2V
2 series · 2 of 2 positions shown · non-contrast
Comparison: Chest x-ray dated February 27, 2018.

CLINICAL DATA: Chest tightness with cough and shortness of breath.
Asthma attack.

EXAM:
CHEST - 2 VIEW

[chest pa]
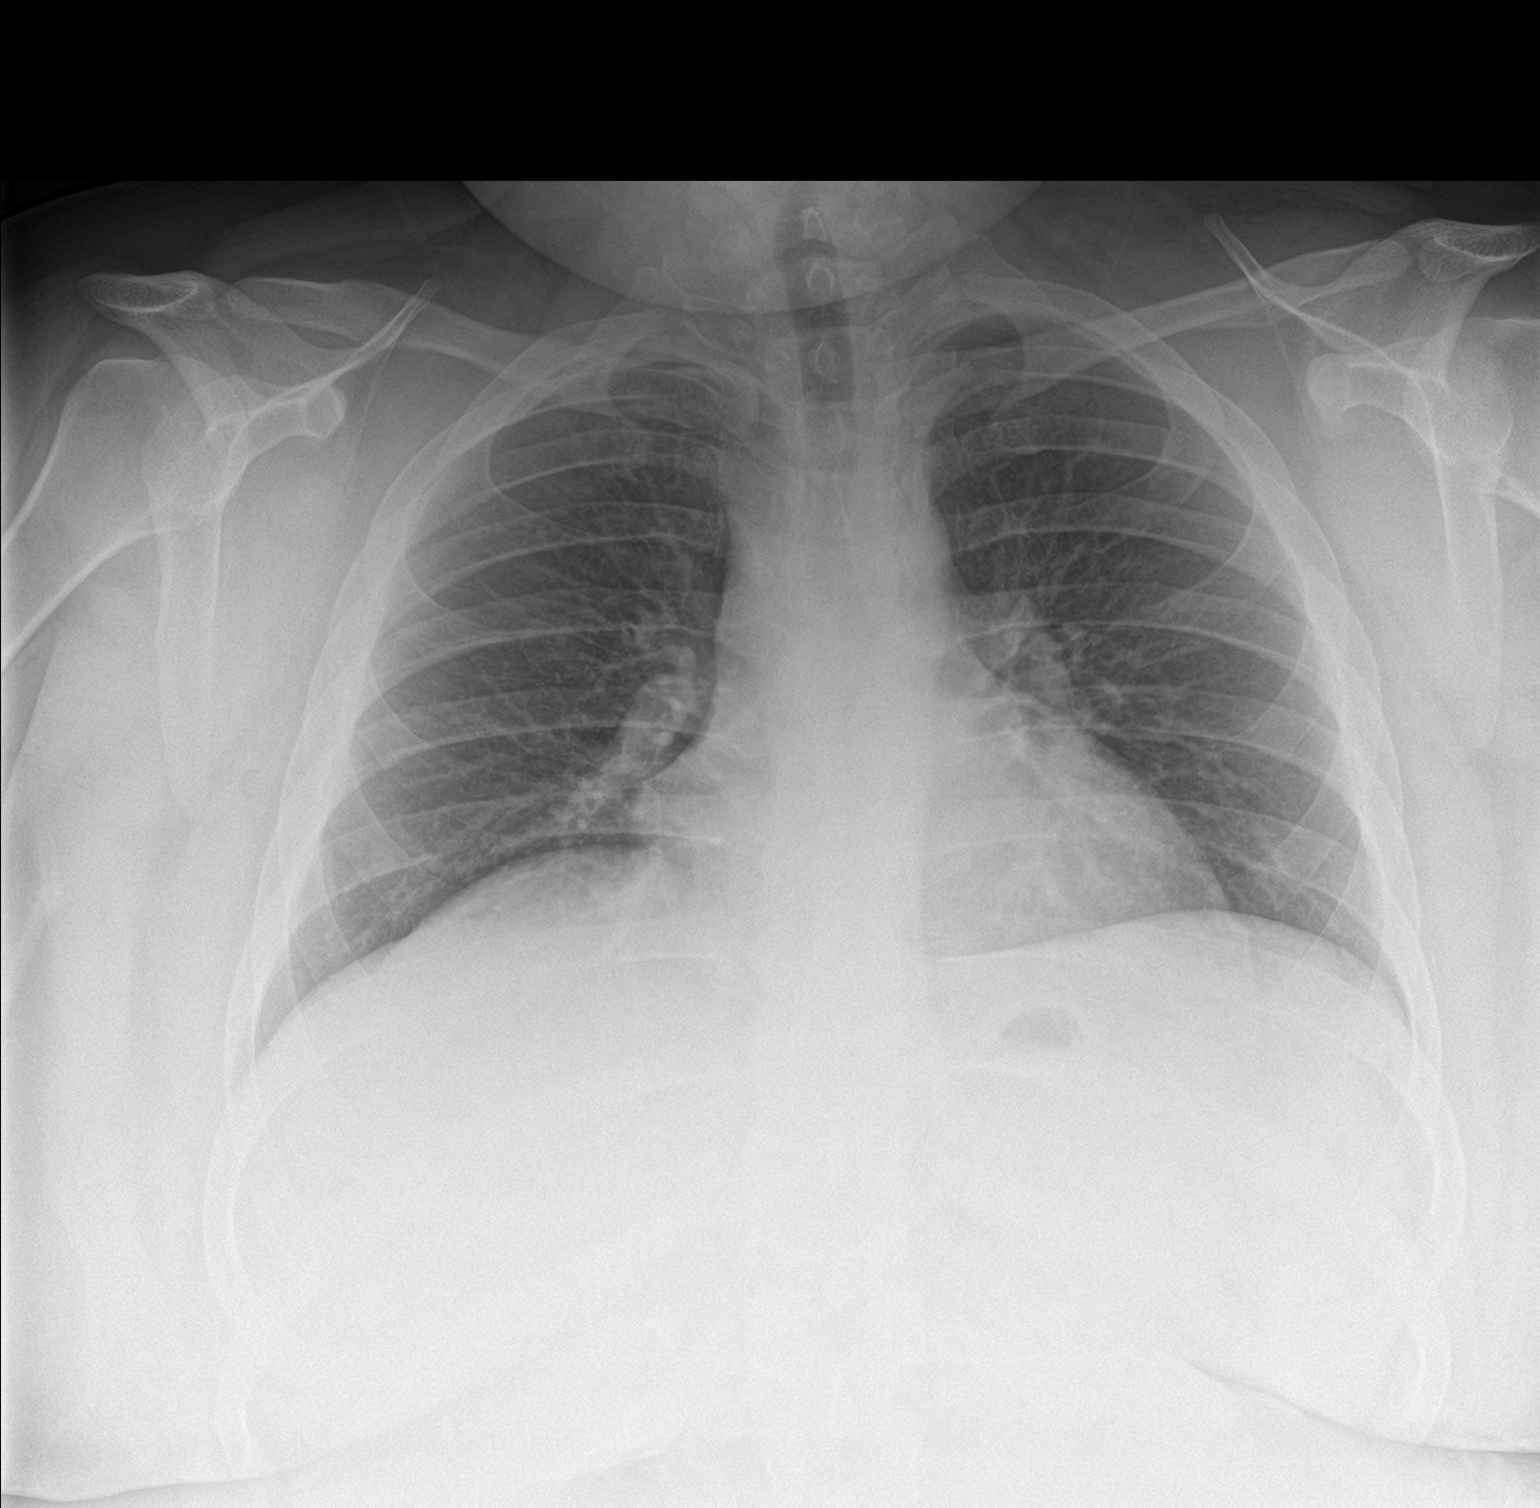

[chest lat]
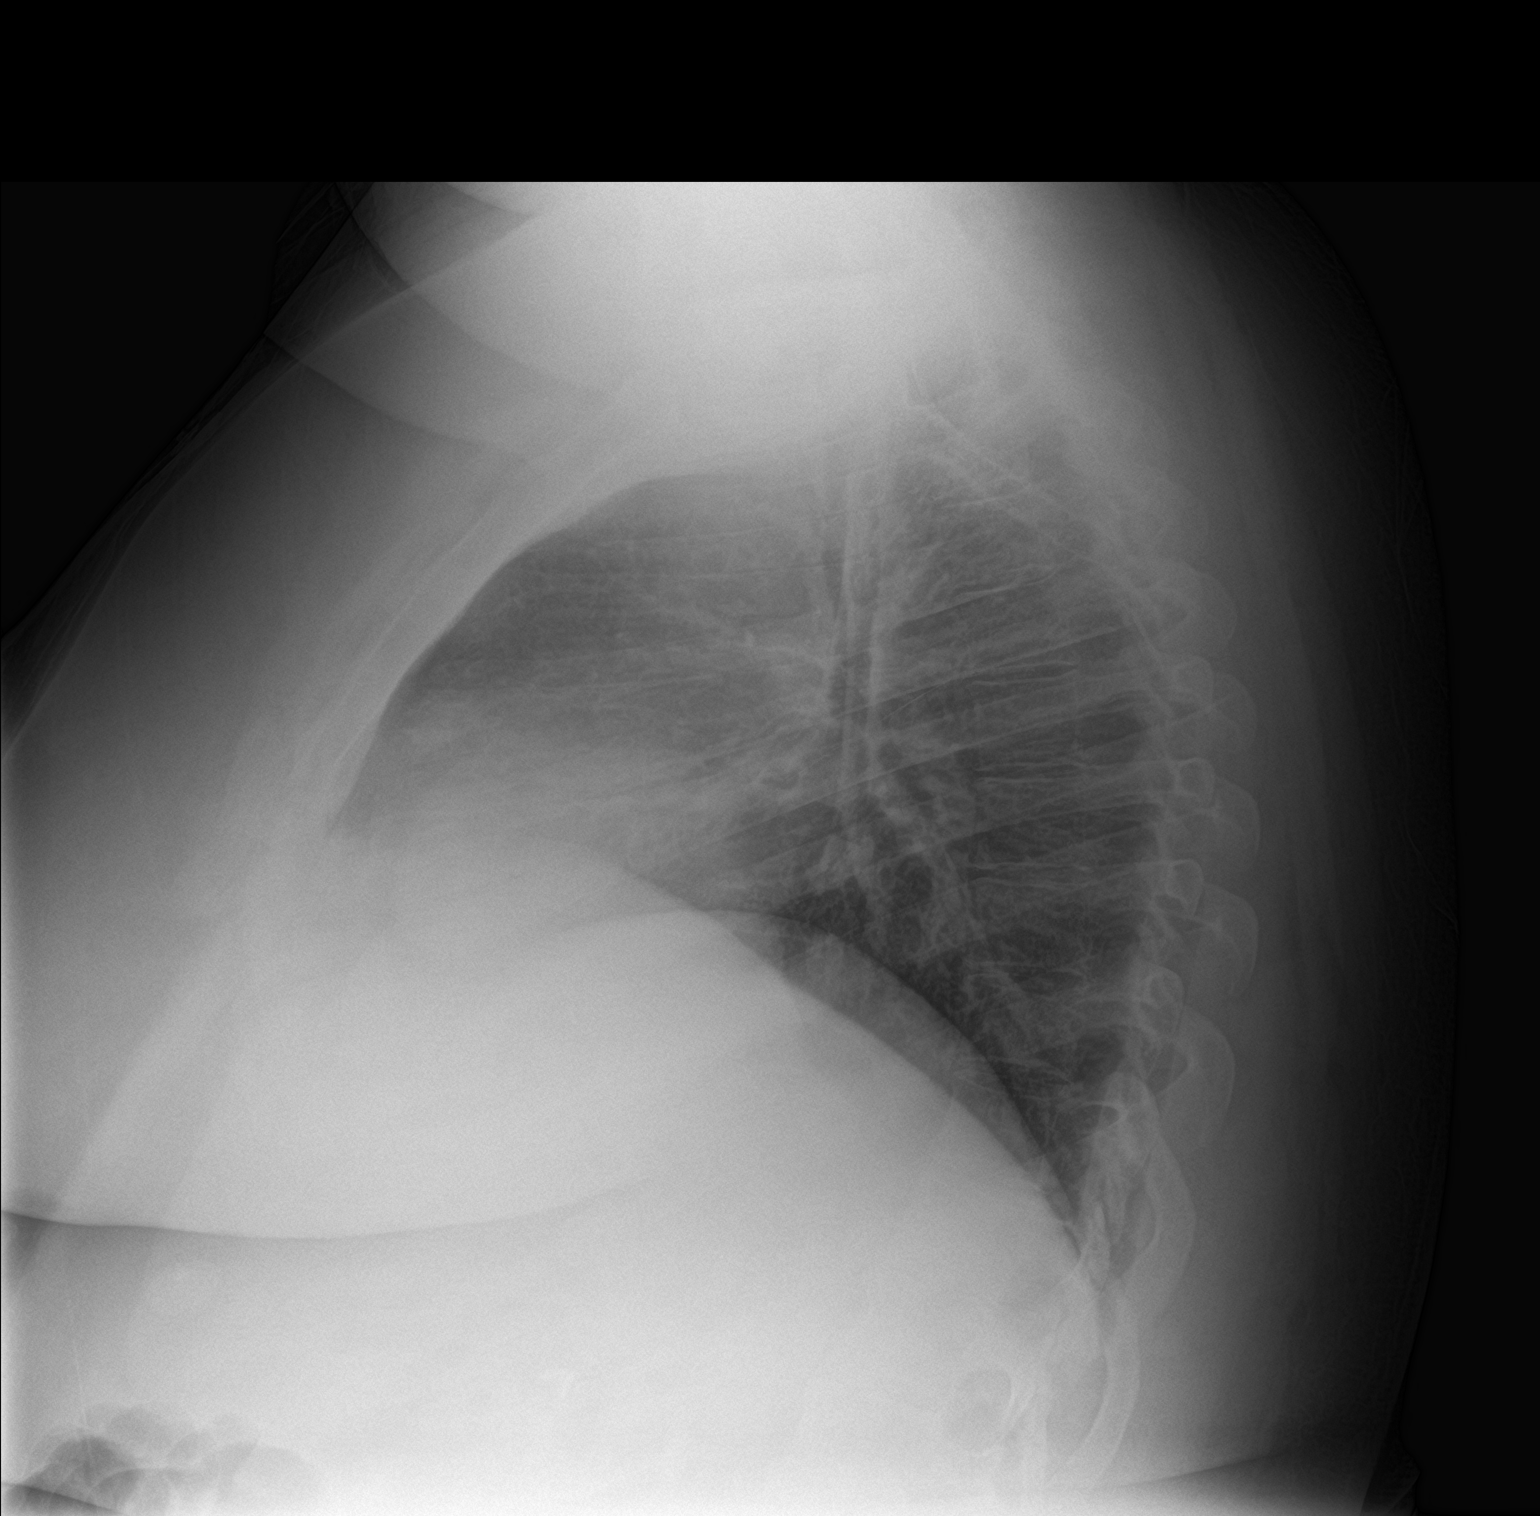

[2 of 2 positions shown; findings below may reference images not displayed]

FINDINGS: The heart size and mediastinal contours are within normal limits.
Both lungs are clear. The visualized skeletal structures are
unremarkable.
IMPRESSION: No active cardiopulmonary disease.

## 2021-05-12 ENCOUNTER — Ambulatory Visit (INDEPENDENT_AMBULATORY_CARE_PROVIDER_SITE_OTHER): Payer: 59

## 2021-05-12 ENCOUNTER — Encounter (HOSPITAL_COMMUNITY): Payer: Self-pay | Admitting: Orthopedic Surgery

## 2021-05-12 ENCOUNTER — Ambulatory Visit (INDEPENDENT_AMBULATORY_CARE_PROVIDER_SITE_OTHER): Payer: 59 | Admitting: Surgical

## 2021-05-12 ENCOUNTER — Other Ambulatory Visit: Payer: Self-pay

## 2021-05-12 DIAGNOSIS — M25531 Pain in right wrist: Secondary | ICD-10-CM

## 2021-05-12 DIAGNOSIS — S52551A Other extraarticular fracture of lower end of right radius, initial encounter for closed fracture: Secondary | ICD-10-CM | POA: Diagnosis not present

## 2021-05-12 MED ORDER — HYDROCODONE-ACETAMINOPHEN 5-325 MG PO TABS: 1.0000 | ORAL_TABLET | Freq: Three times a day (TID) | ORAL | 0 refills | Status: DC | PRN

## 2021-05-12 NOTE — Progress Notes (Signed)
EKG: 04/10/18 CXR: 04/10/18 ECHO: denies Stress Test: denies Cardiac Cath: denies  Fasting Blood Sugar-  na Checks Blood Sugar_na__ times a day  OSA/CPAP: No  ASA/Blood Thinners: No  Covid test not needed  Anesthesia Review: No  Patient denies shortness of breath, fever, cough, and chest pain at PAT appointment.  Patient verbalized understanding of instructions provided today at the PAT appointment.  Patient asked to review instructions at home and day of surgery.

## 2021-05-14 ENCOUNTER — Encounter: Payer: Self-pay | Admitting: Surgical
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
## 2021-05-15 ENCOUNTER — Other Ambulatory Visit: Payer: Self-pay

## 2021-05-15 ENCOUNTER — Ambulatory Visit (HOSPITAL_COMMUNITY)
Admission: RE | Admit: 2021-05-15 | Discharge: 2021-05-15 | Disposition: A | Discharge status: Home / Self Care | Payer: 59 | Attending: Orthopedic Surgery | Admitting: Orthopedic Surgery

## 2021-05-15 ENCOUNTER — Ambulatory Visit (HOSPITAL_COMMUNITY): Payer: 59

## 2021-05-15 ENCOUNTER — Ambulatory Visit (HOSPITAL_COMMUNITY): Payer: 59 | Admitting: Anesthesiology

## 2021-05-15 ENCOUNTER — Encounter (HOSPITAL_COMMUNITY): Payer: Self-pay | Admitting: Orthopedic Surgery

## 2021-05-15 ENCOUNTER — Encounter (HOSPITAL_COMMUNITY)
Admission: RE | Disposition: A | Discharge status: Home / Self Care | Payer: Self-pay | Source: Home / Self Care | Attending: Orthopedic Surgery

## 2021-05-15 DIAGNOSIS — Z880 Allergy status to penicillin: Secondary | ICD-10-CM | POA: Diagnosis not present

## 2021-05-15 DIAGNOSIS — Z791 Long term (current) use of non-steroidal anti-inflammatories (NSAID): Secondary | ICD-10-CM | POA: Insufficient documentation

## 2021-05-15 DIAGNOSIS — Z91030 Bee allergy status: Secondary | ICD-10-CM | POA: Insufficient documentation

## 2021-05-15 DIAGNOSIS — E119 Type 2 diabetes mellitus without complications: Secondary | ICD-10-CM | POA: Diagnosis not present

## 2021-05-15 DIAGNOSIS — S52501A Unspecified fracture of the lower end of right radius, initial encounter for closed fracture: Secondary | ICD-10-CM | POA: Diagnosis present

## 2021-05-15 DIAGNOSIS — J45909 Unspecified asthma, uncomplicated: Secondary | ICD-10-CM | POA: Insufficient documentation

## 2021-05-15 DIAGNOSIS — Z7951 Long term (current) use of inhaled steroids: Secondary | ICD-10-CM | POA: Diagnosis not present

## 2021-05-15 DIAGNOSIS — S52571D Other intraarticular fracture of lower end of right radius, subsequent encounter for closed fracture with routine healing: Secondary | ICD-10-CM

## 2021-05-15 DIAGNOSIS — Y939 Activity, unspecified: Secondary | ICD-10-CM | POA: Diagnosis not present

## 2021-05-15 DIAGNOSIS — S52501D Unspecified fracture of the lower end of right radius, subsequent encounter for closed fracture with routine healing: Secondary | ICD-10-CM

## 2021-05-15 DIAGNOSIS — Z79899 Other long term (current) drug therapy: Secondary | ICD-10-CM | POA: Insufficient documentation

## 2021-05-15 HISTORY — DX: Other specified postprocedural states: Z98.890

## 2021-05-15 HISTORY — DX: Nausea with vomiting, unspecified: R11.2

## 2021-05-15 HISTORY — DX: Other complications of anesthesia, initial encounter: T88.59XA

## 2021-05-15 HISTORY — PX: OPEN REDUCTION INTERNAL FIXATION (ORIF) DISTAL RADIAL FRACTURE: SHX5989

## 2021-05-15 HISTORY — DX: Anemia, unspecified: D64.9

## 2021-05-15 HISTORY — DX: Type 2 diabetes mellitus without complications: E11.9

## 2021-05-15 LAB — GLUCOSE, CAPILLARY
Glucose-Capillary: 95 mg/dL (ref 70–99)
Glucose-Capillary: 80 mg/dL (ref 70–99)
Glucose-Capillary: 86 mg/dL (ref 70–99)
Glucose-Capillary: 76 mg/dL (ref 70–99)
Glucose-Capillary: 102 mg/dL — ABNORMAL HIGH (ref 70–99)

## 2021-05-15 LAB — CBC
HCT: 41.8 % (ref 36.0–46.0)
Hemoglobin: 13.1 g/dL (ref 12.0–15.0)
MCH: 25.5 pg — ABNORMAL LOW (ref 26.0–34.0)
MCHC: 31.3 g/dL (ref 30.0–36.0)
MCV: 81.5 fL (ref 80.0–100.0)
Platelets: 464 10*3/uL — ABNORMAL HIGH (ref 150–400)
RBC: 5.13 MIL/uL — ABNORMAL HIGH (ref 3.87–5.11)
RDW: 15.6 % — ABNORMAL HIGH (ref 11.5–15.5)
WBC: 16.1 10*3/uL — ABNORMAL HIGH (ref 4.0–10.5)
nRBC: 0 % (ref 0.0–0.2)

## 2021-05-15 LAB — BASIC METABOLIC PANEL
Anion gap: 11 (ref 5–15)
BUN: 11 mg/dL (ref 6–20)
CO2: 22 mmol/L (ref 22–32)
Calcium: 9 mg/dL (ref 8.9–10.3)
Chloride: 103 mmol/L (ref 98–111)
Creatinine, Ser: 0.73 mg/dL (ref 0.44–1.00)
GFR, Estimated: >60 mL/min (ref >60)
Glucose, Bld: 81 mg/dL (ref 70–99)
Potassium: 4 mmol/L (ref 3.5–5.1)
Sodium: 136 mmol/L (ref 135–145)

## 2021-05-15 LAB — POCT PREGNANCY, URINE: Preg Test, Ur: NEGATIVE

## 2021-05-15 SURGERY — OPEN REDUCTION INTERNAL FIXATION (ORIF) DISTAL RADIUS FRACTURE
Anesthesia: General | Site: Arm Lower | Laterality: Right

## 2021-05-15 MED ORDER — FENTANYL CITRATE (PF) 250 MCG/5ML IJ SOLN
INTRAMUSCULAR | Status: AC
  Filled 2021-05-15: qty 5

## 2021-05-15 MED ORDER — OXYCODONE-ACETAMINOPHEN 5-325 MG PO TABS: 1.0000 | ORAL_TABLET | ORAL | 0 refills | Status: DC | PRN

## 2021-05-15 MED ORDER — SODIUM CHLORIDE 0.9 % IR SOLN
Status: DC | PRN
  Administered 2021-05-15: 1000 mL

## 2021-05-15 MED ORDER — PHENYLEPHRINE 40 MCG/ML (10ML) SYRINGE FOR IV PUSH (FOR BLOOD PRESSURE SUPPORT)
PREFILLED_SYRINGE | INTRAVENOUS | Status: DC | PRN
Start: 2021-05-15 — End: 2021-05-15
  Administered 2021-05-15 (×2): 80 ug via INTRAVENOUS

## 2021-05-15 MED ORDER — VANCOMYCIN HCL 500 MG IV SOLR
INTRAVENOUS | Status: AC
  Filled 2021-05-15: qty 500

## 2021-05-15 MED ORDER — KETOROLAC TROMETHAMINE 30 MG/ML IJ SOLN
INTRAMUSCULAR | Status: AC
  Filled 2021-05-15: qty 1

## 2021-05-15 MED ORDER — POVIDONE-IODINE 10 % EX SWAB
2.0000 "application " | Freq: Once | CUTANEOUS | Status: AC
  Administered 2021-05-15: 2 via TOPICAL

## 2021-05-15 MED ORDER — ONDANSETRON HCL 4 MG/2ML IJ SOLN: 4.0000 mg | Freq: Once | INTRAMUSCULAR | Status: AC

## 2021-05-15 MED ORDER — PROPOFOL 10 MG/ML IV BOLUS
INTRAVENOUS | Status: AC
  Filled 2021-05-15: qty 20

## 2021-05-15 MED ORDER — KETOROLAC TROMETHAMINE 10 MG PO TABS
10.0000 mg | ORAL_TABLET | Freq: Three times a day (TID) | ORAL | 0 refills | Status: DC | PRN
Start: 2021-05-15 — End: 2021-05-16

## 2021-05-15 MED ORDER — FENTANYL CITRATE (PF) 100 MCG/2ML IJ SOLN
25.0000 ug | INTRAMUSCULAR | Status: DC | PRN
  Administered 2021-05-15 (×2): 25 ug via INTRAVENOUS
  Administered 2021-05-15 (×2): 50 ug via INTRAVENOUS

## 2021-05-15 MED ORDER — LIDOCAINE 2% (20 MG/ML) 5 ML SYRINGE
INTRAMUSCULAR | Status: AC
  Filled 2021-05-15: qty 5

## 2021-05-15 MED ORDER — HYDROMORPHONE HCL 1 MG/ML IJ SOLN
0.5000 mg | INTRAMUSCULAR | Status: DC | PRN
Start: 2021-05-15 — End: 2021-05-16
  Administered 2021-05-15 (×2): 0.5 mg via INTRAVENOUS

## 2021-05-15 MED ORDER — OXYCODONE HCL 5 MG PO TABS
ORAL_TABLET | ORAL | Status: AC
  Filled 2021-05-15: qty 1

## 2021-05-15 MED ORDER — CHLORHEXIDINE GLUCONATE 0.12 % MT SOLN
15.0000 mL | Freq: Once | OROMUCOSAL | Status: AC
  Administered 2021-05-15: 15 mL via OROMUCOSAL
  Filled 2021-05-15: qty 15

## 2021-05-15 MED ORDER — METOPROLOL SUCCINATE ER 25 MG PO TB24
ORAL_TABLET | ORAL | Status: AC
  Administered 2021-05-15: 25 mg
  Filled 2021-05-15: qty 1

## 2021-05-15 MED ORDER — OXYCODONE HCL 5 MG/5ML PO SOLN: 5.0000 mg | Freq: Once | ORAL | Status: AC | PRN

## 2021-05-15 MED ORDER — LIDOCAINE 2% (20 MG/ML) 5 ML SYRINGE
INTRAMUSCULAR | Status: DC | PRN
  Administered 2021-05-15: 20 mg via INTRAVENOUS

## 2021-05-15 MED ORDER — HYDROMORPHONE HCL 1 MG/ML IJ SOLN
INTRAMUSCULAR | Status: AC
  Filled 2021-05-15: qty 1

## 2021-05-15 MED ORDER — FENTANYL CITRATE (PF) 100 MCG/2ML IJ SOLN
INTRAMUSCULAR | Status: AC
  Filled 2021-05-15: qty 2

## 2021-05-15 MED ORDER — ONDANSETRON HCL 4 MG/2ML IJ SOLN
INTRAMUSCULAR | Status: AC
  Filled 2021-05-15: qty 2

## 2021-05-15 MED ORDER — KETOROLAC TROMETHAMINE 30 MG/ML IJ SOLN
30.0000 mg | Freq: Once | INTRAMUSCULAR | Status: AC
  Administered 2021-05-15: 30 mg via INTRAVENOUS

## 2021-05-15 MED ORDER — MIDAZOLAM HCL 2 MG/2ML IJ SOLN
INTRAMUSCULAR | Status: AC
  Administered 2021-05-15: 2 mg
  Filled 2021-05-15: qty 2

## 2021-05-15 MED ORDER — PROPOFOL 10 MG/ML IV BOLUS
INTRAVENOUS | Status: DC | PRN
  Administered 2021-05-15: 100 mg via INTRAVENOUS
  Administered 2021-05-15: 300 mg via INTRAVENOUS

## 2021-05-15 MED ORDER — ORAL CARE MOUTH RINSE: 15.0000 mL | Freq: Once | OROMUCOSAL | Status: AC

## 2021-05-15 MED ORDER — ONDANSETRON HCL 4 MG/2ML IJ SOLN
4.0000 mg | Freq: Once | INTRAMUSCULAR | Status: AC | PRN
  Administered 2021-05-15: 4 mg via INTRAVENOUS

## 2021-05-15 MED ORDER — PROPOFOL 10 MG/ML IV BOLUS
INTRAVENOUS | Status: AC
  Filled 2021-05-15: qty 40

## 2021-05-15 MED ORDER — FENTANYL CITRATE (PF) 100 MCG/2ML IJ SOLN
INTRAMUSCULAR | Status: AC
  Administered 2021-05-15: 100 ug
  Filled 2021-05-15: qty 2

## 2021-05-15 MED ORDER — ONDANSETRON HCL 4 MG/2ML IJ SOLN
INTRAMUSCULAR | Status: AC
  Administered 2021-05-15: 4 mg via INTRAVENOUS
  Filled 2021-05-15: qty 2

## 2021-05-15 MED ORDER — DEXAMETHASONE SODIUM PHOSPHATE 10 MG/ML IJ SOLN
INTRAMUSCULAR | Status: AC
  Filled 2021-05-15: qty 1

## 2021-05-15 MED ORDER — POVIDONE-IODINE 7.5 % EX SOLN: Freq: Once | CUTANEOUS | Status: DC

## 2021-05-15 MED ORDER — ACETAMINOPHEN 10 MG/ML IV SOLN
INTRAVENOUS | Status: AC
  Filled 2021-05-15: qty 100

## 2021-05-15 MED ORDER — LACTATED RINGERS IV SOLN: INTRAVENOUS | Status: DC

## 2021-05-15 MED ORDER — FENTANYL CITRATE (PF) 250 MCG/5ML IJ SOLN
INTRAMUSCULAR | Status: DC | PRN
  Administered 2021-05-15 (×5): 25 ug via INTRAVENOUS

## 2021-05-15 MED ORDER — OXYCODONE HCL 5 MG PO TABS
5.0000 mg | ORAL_TABLET | Freq: Once | ORAL | Status: AC | PRN
  Administered 2021-05-15: 5 mg via ORAL

## 2021-05-15 MED ORDER — ONDANSETRON HCL 4 MG/2ML IJ SOLN
INTRAMUSCULAR | Status: DC | PRN
  Administered 2021-05-15: 4 mg via INTRAVENOUS

## 2021-05-15 MED ORDER — METOPROLOL SUCCINATE ER 25 MG PO TB24: 25.0000 mg | ORAL_TABLET | Freq: Every day | ORAL | Status: DC

## 2021-05-15 MED ORDER — VANCOMYCIN HCL IN DEXTROSE 1-5 GM/200ML-% IV SOLN
1000.0000 mg | INTRAVENOUS | Status: AC
  Administered 2021-05-15: 1000 mg via INTRAVENOUS
  Filled 2021-05-15: qty 200

## 2021-05-15 MED ORDER — VANCOMYCIN HCL 500 MG IV SOLR
INTRAVENOUS | Status: DC | PRN
  Administered 2021-05-15: 500 mg via TOPICAL

## 2021-05-15 MED ORDER — AMISULPRIDE (ANTIEMETIC) 5 MG/2ML IV SOLN
10.0000 mg | Freq: Once | INTRAVENOUS | Status: AC
  Administered 2021-05-15: 10 mg via INTRAVENOUS

## 2021-05-15 MED ORDER — ACETAMINOPHEN 10 MG/ML IV SOLN
INTRAVENOUS | Status: DC | PRN
  Administered 2021-05-15: 1000 mg via INTRAVENOUS

## 2021-05-15 MED ORDER — DEXAMETHASONE SODIUM PHOSPHATE 10 MG/ML IJ SOLN
INTRAMUSCULAR | Status: DC | PRN
  Administered 2021-05-15: 10 mg via INTRAVENOUS

## 2021-05-15 MED ORDER — AMISULPRIDE (ANTIEMETIC) 5 MG/2ML IV SOLN
INTRAVENOUS | Status: AC
  Filled 2021-05-15: qty 2

## 2021-05-15 MED ORDER — MIDAZOLAM HCL 2 MG/2ML IJ SOLN
INTRAMUSCULAR | Status: AC
  Filled 2021-05-15: qty 2

## 2021-05-15 MED ORDER — PROPOFOL 500 MG/50ML IV EMUL
INTRAVENOUS | Status: DC | PRN
  Administered 2021-05-15: 75 ug/kg/min via INTRAVENOUS

## 2021-05-15 SURGICAL SUPPLY — 81 items
BIT DRILL 2.2 SS TIBIAL (BIT) ×2 IMPLANT
BLADE SURG 10 STRL SS (BLADE) ×2 IMPLANT
BNDG ELASTIC 4X5.8 VLCR STR LF (GAUZE/BANDAGES/DRESSINGS) ×2 IMPLANT
BNDG ESMARK 4X9 LF (GAUZE/BANDAGES/DRESSINGS) ×2 IMPLANT
BNDG GAUZE ELAST 4 BULKY (GAUZE/BANDAGES/DRESSINGS) ×2 IMPLANT
CORD BIPOLAR FORCEPS 12FT (ELECTRODE) ×2 IMPLANT
COVER SURGICAL LIGHT HANDLE (MISCELLANEOUS) ×2 IMPLANT
COVER WAND RF STERILE (DRAPES) IMPLANT
CUFF TOURN SGL QUICK 18X4 (TOURNIQUET CUFF) ×2 IMPLANT
CUFF TOURN SGL QUICK 24 (TOURNIQUET CUFF)
CUFF TRNQT CYL 24X4X16.5-23 (TOURNIQUET CUFF) IMPLANT
DRAIN TLS ROUND 10FR (DRAIN) IMPLANT
DRAPE INCISE IOBAN 66X45 STRL (DRAPES) IMPLANT
DRAPE OEC MINIVIEW 54X84 (DRAPES) ×2 IMPLANT
DRAPE U-SHAPE 47X51 STRL (DRAPES) ×2 IMPLANT
DRIVER BIT SQUARE 1.7/2.2 (TRAUMA) ×2 IMPLANT
DRSG PAD ABDOMINAL 8X10 ST (GAUZE/BANDAGES/DRESSINGS) IMPLANT
DRSG XEROFORM 1X8 (GAUZE/BANDAGES/DRESSINGS) ×2 IMPLANT
DURAPREP 26ML APPLICATOR (WOUND CARE) ×2 IMPLANT
ELECT REM PT RETURN 9FT ADLT (ELECTROSURGICAL) ×2
ELECTRODE REM PT RTRN 9FT ADLT (ELECTROSURGICAL) ×1 IMPLANT
FACESHIELD WRAPAROUND (MASK) ×2 IMPLANT
GAUZE SPONGE 4X4 12PLY STRL (GAUZE/BANDAGES/DRESSINGS) ×2 IMPLANT
GAUZE XEROFORM 1X8 LF (GAUZE/BANDAGES/DRESSINGS) IMPLANT
GLOVE BIO SURGEON ST LM GN SZ9 (GLOVE) ×2 IMPLANT
GLOVE ECLIPSE 8.0 STRL XLNG CF (GLOVE) ×2 IMPLANT
GLOVE SRG 8 PF TXTR STRL LF DI (GLOVE) ×1 IMPLANT
GLOVE SURG UNDER POLY LF SZ8 (GLOVE) ×1
GOWN STRL REUS W/ TWL LRG LVL3 (GOWN DISPOSABLE) ×2 IMPLANT
GOWN STRL REUS W/ TWL XL LVL3 (GOWN DISPOSABLE) ×1 IMPLANT
GOWN STRL REUS W/TWL LRG LVL3 (GOWN DISPOSABLE) ×2
GOWN STRL REUS W/TWL XL LVL3 (GOWN DISPOSABLE) ×1
K-WIRE 1.6 (WIRE) ×2
K-WIRE FX5X1.6XNS BN SS (WIRE) ×2
KIT BASIN OR (CUSTOM PROCEDURE TRAY) ×2 IMPLANT
KIT TURNOVER KIT B (KITS) ×2 IMPLANT
KWIRE FX5X1.6XNS BN SS (WIRE) ×2 IMPLANT
MANIFOLD NEPTUNE II (INSTRUMENTS) ×2 IMPLANT
NEEDLE 22X1 1/2 (OR ONLY) (NEEDLE) IMPLANT
NS IRRIG 1000ML POUR BTL (IV SOLUTION) ×2 IMPLANT
PACK ORTHO EXTREMITY (CUSTOM PROCEDURE TRAY) ×2 IMPLANT
PAD ARMBOARD 7.5X6 YLW CONV (MISCELLANEOUS) ×4 IMPLANT
PAD CAST 3X4 CTTN HI CHSV (CAST SUPPLIES) IMPLANT
PAD CAST 4YDX4 CTTN HI CHSV (CAST SUPPLIES) ×1 IMPLANT
PADDING CAST COTTON 3X4 STRL (CAST SUPPLIES)
PADDING CAST COTTON 4X4 STRL (CAST SUPPLIES) ×1
PLATE DVR CROSSLOCK STD RT (Plate) ×2 IMPLANT
SCREW  LP NL 2.7X15MM (Screw) ×1 IMPLANT
SCREW  LP NL 2.7X16MM (Screw) ×1 IMPLANT
SCREW 2.7X14MM (Screw) ×1 IMPLANT
SCREW BN 14X2.7XNONLOCK 3 LD (Screw) ×1 IMPLANT
SCREW LOCK 16X2.7X 3 LD TPR (Screw) ×1 IMPLANT
SCREW LOCK 18X2.7X 3 LD TPR (Screw) ×1 IMPLANT
SCREW LOCK 20X2.7X 3 LD TPR (Screw) ×4 IMPLANT
SCREW LOCK 22X2.7X 3 LD TPR (Screw) ×2 IMPLANT
SCREW LOCKING 2.7X16 (Screw) ×1 IMPLANT
SCREW LOCKING 2.7X18 (Screw) ×1 IMPLANT
SCREW LOCKING 2.7X20MM (Screw) ×4 IMPLANT
SCREW LOCKING 2.7X22MM (Screw) ×2 IMPLANT
SCREW LP NL 2.7X15MM (Screw) ×1 IMPLANT
SCREW LP NL 2.7X16MM (Screw) ×1 IMPLANT
SCREW NONLOCK 2.7X18MM (Screw) ×4 IMPLANT
SLING ARM FOAM STRAP LRG (SOFTGOODS) ×2 IMPLANT
SPLINT PLASTER EXTRA FAST 3X15 (CAST SUPPLIES) ×1
SPLINT PLASTER GYPS XFAST 3X15 (CAST SUPPLIES) ×1 IMPLANT
SPONGE LAP 4X18 RFD (DISPOSABLE) ×4 IMPLANT
STAPLER VISISTAT 35W (STAPLE) IMPLANT
STRIP CLOSURE SKIN 1/2X4 (GAUZE/BANDAGES/DRESSINGS) IMPLANT
SUCTION FRAZIER HANDLE 10FR (MISCELLANEOUS) ×1
SUCTION TUBE FRAZIER 10FR DISP (MISCELLANEOUS) ×1 IMPLANT
SUT ETHILON 3 0 PS 1 (SUTURE) ×6 IMPLANT
SUT PROLENE 3 0 PS 1 (SUTURE) IMPLANT
SUT VIC AB 2-0 CTB1 (SUTURE) IMPLANT
SUT VIC AB 3-0 X1 27 (SUTURE) ×2 IMPLANT
SYR CONTROL 10ML LL (SYRINGE) IMPLANT
SYSTEM CHEST DRAIN TLS 7FR (DRAIN) IMPLANT
TOWEL GREEN STERILE (TOWEL DISPOSABLE) ×2 IMPLANT
TOWEL GREEN STERILE FF (TOWEL DISPOSABLE) ×2 IMPLANT
TUBE CONNECTING 12X1/4 (SUCTIONS) ×2 IMPLANT
WATER STERILE IRR 1000ML POUR (IV SOLUTION) IMPLANT
YANKAUER SUCT BULB TIP NO VENT (SUCTIONS) ×2 IMPLANT

## 2021-05-15 NOTE — Anesthesia Preprocedure Evaluation (Signed)
Anesthesia Evaluation  Patient identified by MRN, date of birth, ID band Patient awake    Reviewed: Allergy & Precautions, NPO status , Patient's Chart, lab work & pertinent test results  Airway Mallampati: III  TM Distance: >3 FB     Dental  (+) Teeth Intact, Dental Advisory Given   Pulmonary   Bilateral end-expiratory wheezes   + wheezing      Cardiovascular  Rhythm:Regular Rate:Tachycardia     Neuro/Psych    GI/Hepatic   Endo/Other  diabetes  Renal/GU      Musculoskeletal   Abdominal   Peds  Hematology   Anesthesia Other Findings   Reproductive/Obstetrics                             Anesthesia Physical Anesthesia Plan  ASA: III  Anesthesia Plan: MAC   Post-op Pain Management:  Regional for Post-op pain   Induction:   PONV Risk Score and Plan: Ondansetron and Dexamethasone  Airway Management Planned: Natural Airway and Simple Face Mask  Additional Equipment:   Intra-op Plan:   Post-operative Plan:   Informed Consent: I have reviewed the patients History and Physical, chart, labs and discussed the procedure including the risks, benefits and alternatives for the proposed anesthesia with the patient or authorized representative who has indicated his/her understanding and acceptance.     Dental advisory given  Plan Discussed with: CRNA and Anesthesiologist  Anesthesia Plan Comments: (Plan Axillary block and MAC vs. LMA)        Anesthesia Quick Evaluation

## 2021-05-15 NOTE — Anesthesia Procedure Notes (Addendum)
Anesthesia Regional Block: Axillary brachial plexus block   Pre-Anesthetic Checklist: ,, timeout performed,, Correct Site, Correct Laterality, Correct Procedure,, site marked, risks and benefits discussed, surgical consent, pre-op evaluation,  At surgeon's request and post-op pain management  Laterality: Right  Prep: chloraprep       Needles:   Needle Type: Echogenic Stimulator Needle      Needle Gauge: 22     Additional Needles:   Procedures:,,,, ultrasound used (permanent image in chart),,,,  Narrative:  Start time: 05/15/2021 5:20 PM End time: 05/15/2021 5:30 PM Injection made incrementally with aspirations every 5 mL.  Performed by: Personally   Additional Notes: 30 cc 0.5% Bupivacaine 1:200 Epi injected easily (22 cc around axillary artery, 8 cc in body of coracobrachialis muscle.

## 2021-05-15 NOTE — Brief Op Note (Signed)
   05/15/2021  8:10 PM  PATIENT:  Tina Manning  30 y.o. female  PRE-OPERATIVE DIAGNOSIS:  right distal radius fracture  POST-OPERATIVE DIAGNOSIS:  right distal radius fracture  PROCEDURE:  Procedure(s): OPEN REDUCTION INTERNAL FIXATION (ORIF)RIGHT  DISTAL RADIAL FRACTURE  SURGEON:  Surgeon(s): August Saucer, Corrie Mckusick, MD  ASSISTANT: Karenann Cai, PA  ANESTHESIA:   general  EBL: 15 ml    Total I/O In: 400 [I.V.:400] Out: -   BLOOD ADMINISTERED: none  DRAINS: none   LOCAL MEDICATIONS USED: Vancomycin  SPECIMEN:  No Specimen  COUNTS:  YES  TOURNIQUET:   Total Tourniquet Time Documented: Upper Arm (Right) - 73 minutes Total: Upper Arm (Right) - 73 minutes   DICTATION: .Other Dictation: Dictation Number [id number]  PLAN OF CARE: Discharge to home after PACU  PATIENT DISPOSITION:  PACU - hemodynamically stable

## 2021-05-15 NOTE — Transfer of Care (Signed)
Immediate Anesthesia Transfer of Care Note  Patient: Tina Manning  Procedure(s) Performed: OPEN REDUCTION INTERNAL FIXATION (ORIF)RIGHT  DISTAL RADIAL FRACTURE (Right Arm Lower)  Patient Location: PACU  Anesthesia Type:General  Level of Consciousness: awake, alert  and oriented  Airway & Oxygen Therapy: Patient Spontanous Breathing and Patient connected to face mask oxygen  Post-op Assessment: Report given to RN and Post -op Vital signs reviewed and stable  Post vital signs: Reviewed and stable  Last Vitals:  Vitals Value Taken Time  BP 126/87   Temp    Pulse 101   Resp    SpO2 96     Last Pain:  Vitals:   05/15/21 1255  TempSrc:   PainSc: 6       Patients Stated Pain Goal: 3 (05/15/21 1255)  Complications: No complications documented.

## 2021-05-15 NOTE — Anesthesia Procedure Notes (Signed)
Procedure Name: LMA Insertion Date/Time: 05/15/2021 6:10 PM Performed by: Jed Limerick, CRNA Pre-anesthesia Checklist: Patient identified, Emergency Drugs available, Suction available and Patient being monitored Patient Re-evaluated:Patient Re-evaluated prior to induction Oxygen Delivery Method: Circle System Utilized Preoxygenation: Pre-oxygenation with 100% oxygen Induction Type: IV induction Ventilation: Mask ventilation without difficulty LMA: LMA inserted LMA Size: 4.0 Number of attempts: 1 Placement Confirmation: positive ETCO2 Tube secured with: Tape Dental Injury: Teeth and Oropharynx as per pre-operative assessment
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
## 2021-05-15 NOTE — Anesthesia Postprocedure Evaluation (Signed)
Anesthesia Post Note  Patient: Chelsea Pedretti  Procedure(s) Performed: OPEN REDUCTION INTERNAL FIXATION (ORIF)RIGHT  DISTAL RADIAL FRACTURE (Right Arm Lower)     Patient location during evaluation: PACU Anesthesia Type: General Level of consciousness: awake and alert Pain management: pain level controlled Vital Signs Assessment: post-procedure vital signs reviewed and stable Respiratory status: spontaneous breathing, nonlabored ventilation, respiratory function stable and patient connected to nasal cannula oxygen Cardiovascular status: blood pressure returned to baseline and stable Postop Assessment: no apparent nausea or vomiting Anesthetic complications: no   No complications documented.  Last Vitals:  Vitals:   05/15/21 2200 05/15/21 2215  BP: 127/88 116/80  Pulse: 72 92  Resp: 17 19  Temp:  (!) 36.3 C  SpO2: 100% 100%    Last Pain:  Vitals:   05/15/21 2215  TempSrc:   PainSc: 3                  Kennieth Rad

## 2021-05-16 ENCOUNTER — Telehealth: Payer: Self-pay | Admitting: Surgical

## 2021-05-16 ENCOUNTER — Encounter (HOSPITAL_COMMUNITY): Payer: Self-pay | Admitting: Orthopedic Surgery

## 2021-05-16 ENCOUNTER — Other Ambulatory Visit: Payer: Self-pay | Admitting: Surgical

## 2021-05-16 MED ORDER — KETOROLAC TROMETHAMINE 10 MG PO TABS: 10.0000 mg | ORAL_TABLET | Freq: Three times a day (TID) | ORAL | 0 refills | Status: DC | PRN

## 2021-05-16 MED ORDER — OXYCODONE-ACETAMINOPHEN 5-325 MG PO TABS: 1.0000 | ORAL_TABLET | ORAL | 0 refills | Status: DC | PRN

## 2021-05-16 NOTE — Telephone Encounter (Signed)
Called and advised. Pt stated understanding to this

## 2021-05-16 NOTE — Telephone Encounter (Signed)
Patient called advised she do not have anything for pain. Patient said she uses the [facility] in [place] on [address]. The number to contact patient is [phone]

## 2021-05-16 NOTE — Telephone Encounter (Signed)
Please advise 

## 2021-05-16 NOTE — Op Note (Signed)
[name], Tina Manning MEDICAL RECORD NO: [id number] ACCOUNT NO: [id number] DATE OF BIRTH: [birth year] FACILITY: [facility] LOCATION: [facility] PHYSICIAN: Graylin Shiver. August Saucer, MD  Operative Report   DATE OF PROCEDURE: 05/15/2021  PREOPERATIVE DIAGNOSIS:  Right distal radius fracture.  POSTOPERATIVE DIAGNOSIS:  Right distal radius fracture.  PROCEDURE:  Open reduction and internal fixation of right distal radius fracture.  SURGEON ATTENDING:  Cammy Copa, MD  ASSISTANT:  Karenann Cai.  IMPLANTS UTILIZED:  Include [employer] distal radius plate with proximal and distal locking and nonlocking screws.  DESCRIPTION OF PROCEDURE:  The patient was brought to the operating room where general anesthetic was induced.  Preoperative antibiotics were administered.  Timeout was called.  Right arm was prescrubbed with alcohol and Betadine and allowed to air dry,  prepped with DuraPrep solution, and draped in a sterile manner.  [name] was used to cover the operative field.  The arm was elevated, exsanguinated with the Esmarch wrap.  Tourniquet was inflated for a total tourniquet time of 70 minutes at 250 mmHg.   Timeout was called.  Incision was made over the FCR tendon.  Skin and subcutaneous tissue were sharply divided.  Mobilization of the FCR tendon was radial along with the radial artery and veins.  Pronator quadratus was incised.  Fracture was visualized.   Care was taken to avoid injury to the median nerve.  Retraction was performed.  Fracture was reduced.  Plate was placed, which was a DVR plate from [employer].  Two K-wires were placed to secure optimal location on the distal radius.  One screw was placed  in the shaft and minute adjustments were made.  The fracture was then reduced restoring volar tilt, height, and inclination.  Locking screws were placed distally, nonlocking screws placed proximally.  Good reduction was achieved.  Confirmation was made  in the AP and lateral planes under fluoroscopy about  correct screw placement.  Next, the tourniquet was released. Bleeding points encountered were controlled using bipolar electrocautery.  Vancomycin powder was placed.  Skin was closed using 3-0 Vicryl  and 3-0 nylon.  Bulky dressing was applied.  A volar splint was also applied.  The patient tolerated the procedure well without immediate complications.  She was transferred to recovery room in stable condition.  [name]'s assistance was required at all times for retraction, mobilization of tissues, opening, and closing.  His assistance was medical necessity.  Two-part fracture reduced well.   ROH D: 05/15/2021 8:14:39 pm T: 05/15/2021 9:30:00 pm  JOB: [id number]/ [id number]

## 2021-05-16 NOTE — Progress Notes (Signed)
Wasted of fentanyl with Clydene Pugh RN

## 2021-05-16 NOTE — Telephone Encounter (Signed)
I sent the RX to the [facility] on [address] yesterday as her normal pharmacy was not listed as a 24 hr pharmacy.  I will resubmit to her pharmacy, please call and explain, thanks

## 2021-05-17 ENCOUNTER — Telehealth: Payer: Self-pay

## 2021-05-17 ENCOUNTER — Other Ambulatory Visit: Payer: Self-pay | Admitting: Surgical

## 2021-05-17 ENCOUNTER — Telehealth: Payer: Self-pay | Admitting: Orthopedic Surgery

## 2021-05-17 MED ORDER — METHOCARBAMOL 500 MG PO TABS: 500.0000 mg | ORAL_TABLET | Freq: Three times a day (TID) | ORAL | 0 refills | Status: DC | PRN

## 2021-05-17 MED ORDER — GABAPENTIN 300 MG PO CAPS: 300.0000 mg | ORAL_CAPSULE | Freq: Three times a day (TID) | ORAL | 0 refills | Status: DC

## 2021-05-17 NOTE — Telephone Encounter (Signed)
noted 

## 2021-05-17 NOTE — Telephone Encounter (Signed)
Patient called she stated she is having a great amount of pain in her wrist, patient stated the nerve block has completely worn off and she can feel everything now patient stated she has tried ice/elevating ,taking ibuprofen and taking oxycodone and neither seem to be helping or touching the pain she is requesting a call [phone]

## 2021-05-17 NOTE — Telephone Encounter (Signed)
Please advise 

## 2021-05-17 NOTE — Telephone Encounter (Signed)
Duplicate note in chart. [name] calling patient.

## 2021-05-17 NOTE — Telephone Encounter (Signed)
Advised, she will call back tomorrow with any concerns if pain still uncontrolled

## 2021-05-17 NOTE — Telephone Encounter (Signed)
Patient called back requesting a call about about updates to do about her pains. Please call this patient as soon as possible at [phone].

## 2021-05-18 ENCOUNTER — Other Ambulatory Visit: Payer: Self-pay | Admitting: Surgical

## 2021-05-18 ENCOUNTER — Telehealth: Payer: Self-pay | Admitting: Orthopedic Surgery

## 2021-05-18 MED ORDER — OXYCODONE HCL 5 MG PO CAPS: 5.0000 mg | ORAL_CAPSULE | ORAL | 0 refills | Status: DC | PRN

## 2021-05-18 NOTE — Telephone Encounter (Signed)
Sent in RX for refill over the weekend, called pt

## 2021-05-18 NOTE — Telephone Encounter (Signed)
Pt called stating she was told to CB in regards to her medication and running out before she is due back to see Dr.Dean. Pt would like a CB  [phone]

## 2021-05-24 ENCOUNTER — Ambulatory Visit (INDEPENDENT_AMBULATORY_CARE_PROVIDER_SITE_OTHER): Payer: 59

## 2021-05-24 ENCOUNTER — Other Ambulatory Visit: Payer: Self-pay

## 2021-05-24 ENCOUNTER — Ambulatory Visit (INDEPENDENT_AMBULATORY_CARE_PROVIDER_SITE_OTHER): Payer: 59 | Admitting: Orthopedic Surgery

## 2021-05-24 DIAGNOSIS — S52551A Other extraarticular fracture of lower end of right radius, initial encounter for closed fracture: Secondary | ICD-10-CM

## 2021-05-24 NOTE — Telephone Encounter (Signed)
Pls advise.  

## 2021-05-25 ENCOUNTER — Other Ambulatory Visit: Payer: Self-pay | Admitting: Surgical

## 2021-05-25 ENCOUNTER — Telehealth: Payer: Self-pay | Admitting: Surgical

## 2021-05-25 ENCOUNTER — Other Ambulatory Visit: Payer: Self-pay

## 2021-05-25 MED ORDER — OXYCODONE HCL 5 MG PO CAPS: 5.0000 mg | ORAL_CAPSULE | Freq: Four times a day (QID) | ORAL | 0 refills | Status: DC | PRN

## 2021-05-25 MED ORDER — METHOCARBAMOL 500 MG PO TABS: 500.0000 mg | ORAL_TABLET | Freq: Three times a day (TID) | ORAL | 0 refills | Status: DC | PRN

## 2021-05-25 NOTE — Telephone Encounter (Signed)
IC advised to cover with dressing.  Dr August Saucer wanted to see patient in office tomorrow but she was unable to come in for an appt.  Per Dr August Saucer we have asked patient to send photo today and tomorrow of incision.

## 2021-05-25 NOTE — Telephone Encounter (Signed)
Pt called and states she had some stitches taken out yesterday and she started bleeding last night and this morning and wondering if that is normal? Also can she cover it with something or does it need to breathe?

## 2021-05-25 NOTE — Telephone Encounter (Signed)
Looks ok elevate as much as possible

## 2021-05-26 ENCOUNTER — Other Ambulatory Visit: Payer: Self-pay | Admitting: Surgical

## 2021-05-26 MED ORDER — OXYCODONE HCL 5 MG PO TABS: 5.0000 mg | ORAL_TABLET | Freq: Four times a day (QID) | ORAL | 0 refills | Status: DC | PRN

## 2021-05-27 DIAGNOSIS — S52501D Unspecified fracture of the lower end of right radius, subsequent encounter for closed fracture with routine healing: Secondary | ICD-10-CM

## 2021-05-28 ENCOUNTER — Encounter: Payer: Self-pay | Admitting: Orthopedic Surgery

## 2021-05-28 NOTE — Progress Notes (Signed)
   Post-Op Visit Note   Patient: Tina Manning           Date of Birth: Mar 10, 1991           MRN: [id number] Visit Date: 05/24/2021 PCP: Patient, No Pcp Per (Inactive)   Assessment & Plan:  Chief Complaint:  Chief Complaint  Patient presents with  . Right Wrist - Routine Post Op   Visit Diagnoses:  1. Other closed extra-articular fracture of distal end of right radius, initial encounter     Plan: Patient is a 30 year old female who presents s/p right distal radius ORIF on 05/15/2021.  She is doing okay but notes a lot of pain.  She had difficulty taking Toradol due to her asthma so she has stopped this.  She is taking oxycodone about twice per day.  Plan to continue weaning off of the oxycodone.  Radiographs taken today of the right wrist show hardware in excellent position with no change since operative radiographs.  No callus formation noted at this time but it is early in her recovery from surgery.  She does have well-healing incision and about every other suture was removed but other sutures were allowed to remain in place until this following Monday where she will have those removed as well.  No evidence of dehiscence on exam today and no evidence of infection.  1+ radial pulse of the right upper extremity noted.  Full motor function is intact.  She does have complaint of some numbness through the proximal palm on the volar side that is likely due to retraction at time of surgery.  Anticipate this will improve.  Wrist splint removed and patient changed to removable wrist brace.  She will avoid any heavy lifting but okay to start gentle range of motion in order to work on avoiding stiffness of the right wrist.  Plan to follow-up on Monday for suture removal.  Follow-Up Instructions: No follow-ups on file.   Orders:  Orders Placed This Encounter  Procedures  . XR Wrist 2 Views Right   No orders of the defined types were placed in this encounter.   Imaging: No results  found.  PMFS History: Patient Active Problem List   Diagnosis Date Noted  . Closed fracture of lower end of right radius with routine healing    Past Medical History:  Diagnosis Date  . Anemia   . Asthma   . Complication of anesthesia   . Diabetes mellitus without complication (HCC)   . Endometriosis   . PONV (postoperative nausea and vomiting)   . Tachycardia     No family history on file.  Past Surgical History:  Procedure Laterality Date  . CHOLECYSTECTOMY    . OPEN REDUCTION INTERNAL FIXATION (ORIF) DISTAL RADIAL FRACTURE Right 05/15/2021   Procedure: OPEN REDUCTION INTERNAL FIXATION (ORIF)RIGHT  DISTAL RADIAL FRACTURE;  Surgeon: Cammy Copa, MD;  Location: [facility];  Service: Orthopedics;  Laterality: Right;   Social History   Occupational History  . Not on file  Tobacco Use  . Smoking status: Never Smoker  . Smokeless tobacco: Never Used  Vaping Use  . Vaping Use: Never used  Substance and Sexual Activity  . Alcohol use: No  . Drug use: No  . Sexual activity: Not on file

## 2021-05-29 ENCOUNTER — Ambulatory Visit (INDEPENDENT_AMBULATORY_CARE_PROVIDER_SITE_OTHER): Payer: 59 | Admitting: Orthopedic Surgery

## 2021-05-29 DIAGNOSIS — S52551A Other extraarticular fracture of lower end of right radius, initial encounter for closed fracture: Secondary | ICD-10-CM

## 2021-06-01 ENCOUNTER — Encounter: Payer: Self-pay | Admitting: Orthopedic Surgery

## 2021-06-01 NOTE — Progress Notes (Signed)
   Post-Op Visit Note   Patient: Tina Manning           Date of Birth: 11/15/1991           MRN: [id number] Visit Date: 05/29/2021 PCP: Patient, No Pcp Per (Inactive)   Assessment & Plan:  Chief Complaint:  Chief Complaint  Patient presents with   Right Wrist - Routine Post Op, Pain   Visit Diagnoses:  1. Other closed extra-articular fracture of distal end of right radius, initial encounter     Plan: Patient is a 30 year old female who presents s/p right distal radius ORIF on 05/15/2021.  She is here today for suture removal and incision recheck.  Incision is healing well with no evidence of infection or dehiscence.  Sutures were all removed today and replaced with Steri-Strips.  She will remain in her wrist brace and continue her light range of motion exercises.  She has continued numbness of the base of her palm is likely related to retraction of time of surgery.  Intact EPL, finger abduction, adduction.  1+ radial pulse.  Taking pain medicine about twice per day.  Follow-up in 2 weeks for clinical recheck.  Consider outpatient occupational therapy in the future if she is having trouble getting her range of motion back.  Follow-Up Instructions: No follow-ups on file.   Orders:  No orders of the defined types were placed in this encounter.  No orders of the defined types were placed in this encounter.   Imaging: No results found.  PMFS History: Patient Active Problem List   Diagnosis Date Noted   Closed fracture of lower end of right radius with routine healing    Past Medical History:  Diagnosis Date   Anemia    Asthma    Complication of anesthesia    Diabetes mellitus without complication (HCC)    Endometriosis    PONV (postoperative nausea and vomiting)    Tachycardia     No family history on file.  Past Surgical History:  Procedure Laterality Date   CHOLECYSTECTOMY     OPEN REDUCTION INTERNAL FIXATION (ORIF) DISTAL RADIAL FRACTURE Right 05/15/2021    Procedure: OPEN REDUCTION INTERNAL FIXATION (ORIF)RIGHT  DISTAL RADIAL FRACTURE;  Surgeon: Cammy Copa, MD;  Location: [facility] OR;  Service: Orthopedics;  Laterality: Right;   Social History   Occupational History   Not on file  Tobacco Use   Smoking status: Never   Smokeless tobacco: Never  Vaping Use   Vaping Use: Never used  Substance and Sexual Activity   Alcohol use: No   Drug use: No   Sexual activity: Not on file

## 2021-06-06 ENCOUNTER — Other Ambulatory Visit: Payer: Self-pay | Admitting: Surgical

## 2021-06-06 MED ORDER — ONDANSETRON HCL 4 MG PO TABS: 4.0000 mg | ORAL_TABLET | Freq: Three times a day (TID) | ORAL | 0 refills | Status: DC | PRN

## 2021-06-12 ENCOUNTER — Ambulatory Visit (INDEPENDENT_AMBULATORY_CARE_PROVIDER_SITE_OTHER): Payer: 59 | Admitting: Orthopedic Surgery

## 2021-06-12 ENCOUNTER — Ambulatory Visit: Payer: Self-pay

## 2021-06-12 DIAGNOSIS — S52551A Other extraarticular fracture of lower end of right radius, initial encounter for closed fracture: Secondary | ICD-10-CM

## 2021-06-13 ENCOUNTER — Other Ambulatory Visit: Payer: Self-pay

## 2021-06-15 MED ORDER — OXYCODONE HCL 5 MG PO TABS: 5.0000 mg | ORAL_TABLET | Freq: Two times a day (BID) | ORAL | 0 refills | Status: DC | PRN

## 2021-06-15 MED ORDER — GABAPENTIN 300 MG PO CAPS: 300.0000 mg | ORAL_CAPSULE | Freq: Three times a day (TID) | ORAL | 0 refills | Status: DC

## 2021-06-15 MED ORDER — METHOCARBAMOL 500 MG PO TABS: 500.0000 mg | ORAL_TABLET | Freq: Three times a day (TID) | ORAL | 0 refills | Status: DC | PRN

## 2021-06-15 MED ORDER — ONDANSETRON HCL 4 MG PO TABS: 4.0000 mg | ORAL_TABLET | Freq: Three times a day (TID) | ORAL | 0 refills | Status: DC | PRN

## 2021-06-18 ENCOUNTER — Encounter: Payer: Self-pay | Admitting: Orthopedic Surgery

## 2021-06-18 NOTE — Progress Notes (Signed)
   Post-Op Visit Note   Patient: Tina Manning           Date of Birth: August 30, 1991           MRN: [id number] Visit Date: 06/12/2021 PCP: Patient, No Pcp Per (Inactive)   Assessment & Plan:  Chief Complaint:  Chief Complaint  Patient presents with   Right Wrist - Pain   Visit Diagnoses:  1. Other closed extra-articular fracture of distal end of right radius, initial encounter     Plan: Mahala is a patient is now a month out right wrist open reduction internal fixation.  She is doing well.  On exam she has 70 degrees of extension and 60 of flexion still a little bit of palmar numbness which is improving.  Pronation is full.  Lacking about 10 degrees of full supination.  Radiographs look good.  Grip strength improving.  Okay to return to work Wednesday.  No lifting greater than 2 pounds for 4 weeks with the right hand then activity as tolerated.  Follow-up as needed.  Follow-Up Instructions: Return if symptoms worsen or fail to improve.   Orders:  Orders Placed This Encounter  Procedures   XR Wrist 2 Views Right   No orders of the defined types were placed in this encounter.   Imaging: No results found.  PMFS History: Patient Active Problem List   Diagnosis Date Noted   Closed fracture of lower end of right radius with routine healing    Past Medical History:  Diagnosis Date   Anemia    Asthma    Complication of anesthesia    Diabetes mellitus without complication (HCC)    Endometriosis    PONV (postoperative nausea and vomiting)    Tachycardia     No family history on file.  Past Surgical History:  Procedure Laterality Date   CHOLECYSTECTOMY     OPEN REDUCTION INTERNAL FIXATION (ORIF) DISTAL RADIAL FRACTURE Right 05/15/2021   Procedure: OPEN REDUCTION INTERNAL FIXATION (ORIF)RIGHT  DISTAL RADIAL FRACTURE;  Surgeon: Cammy Copa, MD;  Location: [facility] OR;  Service: Orthopedics;  Laterality: Right;   Social History   Occupational History   Not on file   Tobacco Use   Smoking status: Never   Smokeless tobacco: Never  Vaping Use   Vaping Use: Never used  Substance and Sexual Activity   Alcohol use: No   Drug use: No   Sexual activity: Not on file

## 2021-06-23 ENCOUNTER — Telehealth (INDEPENDENT_AMBULATORY_CARE_PROVIDER_SITE_OTHER): Payer: 59 | Admitting: Family

## 2021-06-23 ENCOUNTER — Other Ambulatory Visit: Payer: Self-pay

## 2021-06-23 DIAGNOSIS — J4540 Moderate persistent asthma, uncomplicated: Secondary | ICD-10-CM | POA: Diagnosis not present

## 2021-06-23 DIAGNOSIS — Z7689 Persons encountering health services in other specified circumstances: Secondary | ICD-10-CM

## 2021-06-23 NOTE — Progress Notes (Signed)
Pt presents to establish care. Pt needs refills on both Symbicort and albuterol inhaler

## 2021-06-23 NOTE — Progress Notes (Signed)
Virtual Visit via Telephone Note  I connected with Tina Manning, on 06/23/2021 at 5:01 PM by telephone due to the COVID-19 pandemic and verified that I am speaking with the correct person using two identifiers.  Due to current restrictions/limitations of in-office visits due to the COVID-19 pandemic, this scheduled clinical appointment was converted to a telehealth visit.   Consent: I discussed the limitations, risks, security and privacy concerns of performing an evaluation and management service by telephone and the availability of in person appointments. I also discussed with the patient that there may be a patient responsible charge related to this service. The patient expressed understanding and agreed to proceed.   Location of Patient: Home  Location of Provider: [facility] at [facility]  Persons participating in Telemedicine visit: Mateya Torti Ricky Stabs, NP Margorie John, CMA   History of Present Illness: Reports 3 weeks ago admitted to hospital with asthma attack. Reports significant asthma attack last year which required intubation for 1 week. Requesting referral to Pulmonology.  Past Medical History:  Diagnosis Date   Anemia    Asthma    Complication of anesthesia    Diabetes mellitus without complication (HCC)    Endometriosis    PONV (postoperative nausea and vomiting)    Tachycardia    Allergies  Allergen Reactions   Bee Venom Anaphylaxis   Penicillins     Current Outpatient Medications on File Prior to Visit  Medication Sig Dispense Refill   albuterol (PROVENTIL HFA;VENTOLIN HFA) 108 (90 Base) MCG/ACT inhaler Inhale 1-2 puffs into the lungs every 6 (six) hours as needed for wheezing or shortness of breath. 1 Inhaler 0   budesonide-formoterol (SYMBICORT) 160-4.5 MCG/ACT inhaler Inhale 2 puffs into the lungs 2 (two) times daily.     ipratropium-albuterol (DUONEB) 0.5-2.5 (3) MG/3ML SOLN Take 3 mLs by nebulization in the  morning, at noon, in the evening, and at bedtime.     methocarbamol (ROBAXIN) 500 MG tablet Take 1 tablet (500 mg total) by mouth every 8 (eight) hours as needed. 30 tablet 0   metoprolol succinate (TOPROL-XL) 25 MG 24 hr tablet Take 25 mg by mouth daily.     gabapentin (NEURONTIN) 300 MG capsule Take 1 capsule (300 mg total) by mouth 3 (three) times daily. (Patient not taking: Reported on 06/23/2021) 30 capsule 0   ondansetron (ZOFRAN) 4 MG tablet Take 1 tablet (4 mg total) by mouth every 8 (eight) hours as needed for nausea or vomiting. 20 tablet 0   oxyCODONE (ROXICODONE) 5 MG immediate release tablet Take 1 tablet (5 mg total) by mouth every 12 (twelve) hours as needed. 20 tablet 0   No current facility-administered medications on file prior to visit.    Observations/Objective: Alert and oriented x 3. Not in acute distress. Physical examination not completed as this is a telemedicine visit.  Assessment and Plan: 1. Encounter to establish care: - Patient presents today to establish care.  - Return for annual physical examination, labs, and health maintenance. Arrive fasting meaning having no food for at least 8 hours prior to appointment. You may have only water or black coffee. Please take scheduled medications as normal.  2. Moderate persistent asthma, unspecified whether complicated: - Continue Albuterol inhaler and Budesonide-Formoterol inhaler as prescribed.  - Per patient request referral to Pulmonology for further evaluation and management.  - Follow-up with primary provider as scheduled.  - albuterol (VENTOLIN HFA) 108 (90 Base) MCG/ACT inhaler; Inhale 1-2 puffs into the lungs every  6 (six) hours as needed for wheezing or shortness of breath.  Dispense: 6.7 g; Refill: 0 - budesonide-formoterol (SYMBICORT) 160-4.5 MCG/ACT inhaler; Inhale 2 puffs into the lungs 2 (two) times daily.  Dispense: 6 g; Refill: 2 - Ambulatory referral to Pulmonology  Follow Up Instructions: Return for  annual physical exam.   Patient was given clear instructions to go to Emergency Department or return to medical center if symptoms don't improve, worsen, or new problems develop.The patient verbalized understanding.  I discussed the assessment and treatment plan with the patient. The patient was provided an opportunity to ask questions and all were answered. The patient agreed with the plan and demonstrated an understanding of the instructions.   The patient was advised to call back or seek an in-person evaluation if the symptoms worsen or if the condition fails to improve as anticipated.    I provided 10 minutes total of non-face-to-face time during this encounter.   Rema Fendt, NP  [facility] [place], [place] [phone] 06/23/2021, 5:01 PM

## 2021-06-24 ENCOUNTER — Encounter: Payer: Self-pay | Admitting: Family

## 2021-06-25 ENCOUNTER — Encounter: Payer: Self-pay | Admitting: Family

## 2021-06-25 MED ORDER — ALBUTEROL SULFATE HFA 108 (90 BASE) MCG/ACT IN AERS: 1.0000 | INHALATION_SPRAY | Freq: Four times a day (QID) | RESPIRATORY_TRACT | 0 refills | Status: DC | PRN

## 2021-06-25 MED ORDER — BUDESONIDE-FORMOTEROL FUMARATE 160-4.5 MCG/ACT IN AERO: 2.0000 | INHALATION_SPRAY | Freq: Two times a day (BID) | RESPIRATORY_TRACT | 2 refills | Status: DC

## 2021-06-27 ENCOUNTER — Other Ambulatory Visit: Payer: Self-pay

## 2021-06-27 DIAGNOSIS — J4540 Moderate persistent asthma, uncomplicated: Secondary | ICD-10-CM

## 2021-06-27 MED ORDER — ALBUTEROL SULFATE HFA 108 (90 BASE) MCG/ACT IN AERS: 1.0000 | INHALATION_SPRAY | Freq: Four times a day (QID) | RESPIRATORY_TRACT | 1 refills | Status: DC | PRN

## 2021-06-27 NOTE — Progress Notes (Signed)
Albuterol inhaler reordered first rx was printed

## 2021-06-28 NOTE — Telephone Encounter (Signed)
Try neurontin 100 po bid # 30 thx

## 2021-06-29 MED ORDER — GABAPENTIN 100 MG PO CAPS: 100.0000 mg | ORAL_CAPSULE | Freq: Two times a day (BID) | ORAL | 0 refills | Status: DC

## 2021-06-30 ENCOUNTER — Telehealth: Payer: Self-pay | Admitting: Orthopedic Surgery

## 2021-06-30 NOTE — Telephone Encounter (Signed)
Received vm from Tina Manning, [place], checking status of request for records. IC,lmvm advised reccords faxed 6/13 to [phone]. Ph [phone]

## 2021-07-05 ENCOUNTER — Telehealth: Payer: Medicaid Other | Admitting: Family

## 2021-07-10 ENCOUNTER — Ambulatory Visit (INDEPENDENT_AMBULATORY_CARE_PROVIDER_SITE_OTHER): Payer: 59 | Admitting: Orthopedic Surgery

## 2021-07-10 DIAGNOSIS — S52551A Other extraarticular fracture of lower end of right radius, initial encounter for closed fracture: Secondary | ICD-10-CM

## 2021-07-10 MED ORDER — TRAMADOL HCL 50 MG PO TABS: ORAL_TABLET | ORAL | 0 refills | Status: DC

## 2021-07-11 ENCOUNTER — Encounter: Payer: Self-pay | Admitting: Orthopedic Surgery

## 2021-07-11 NOTE — Progress Notes (Signed)
   Post-Op Visit Note   Patient: Tina Manning           Date of Birth: [birth year]           MRN: [id number] Visit Date: 07/10/2021 PCP: Rema Fendt, NP   Assessment & Plan:  Chief Complaint:  Chief Complaint  Patient presents with   Right Wrist - Routine Post Op   Visit Diagnoses:  1. Other closed extra-articular fracture of distal end of right radius, initial encounter     Plan: Tina Manning is a patient underwent right wrist open reduction internal fixation 05/16/2019.  Having some night pain.  Taking Neurontin plus muscle relaxer.  She has been back to work.  Having some palmar numbness which may be related to irritation of that palmar cutaneous branch of the median nerve.  On exam she actually has pretty good range of motion lacking only about 10 degrees of full pronation and she does have full supination.  Wrist flexion extension also only about 10 degrees off compared to the left side.  Grip strength is improving but not there yet.  Plan is tramadol at night for pain.  Continue 2 pound lifting limit for 6 more weeks.  Follow-up for clinical recheck in 6 weeks.  Follow-Up Instructions: Return in about 6 weeks (around 08/21/2021).   Orders:  No orders of the defined types were placed in this encounter.  Meds ordered this encounter  Medications   traMADol (ULTRAM) 50 MG tablet    Sig: 1 po q hs prn pain    Dispense:  30 tablet    Refill:  0    Imaging: No results found.  PMFS History: Patient Active Problem List   Diagnosis Date Noted   Closed fracture of lower end of right radius with routine healing    Anxiety 08/09/2011   Endometriosis 03/27/2009   Tachycardia 11/27/1998   Asthma 04/24/1994   Past Medical History:  Diagnosis Date   Anemia    Asthma    Complication of anesthesia    Diabetes mellitus without complication (HCC)    Endometriosis    PONV (postoperative nausea and vomiting)    Tachycardia     No family history on file.  Past Surgical  History:  Procedure Laterality Date   CHOLECYSTECTOMY     OPEN REDUCTION INTERNAL FIXATION (ORIF) DISTAL RADIAL FRACTURE Right 05/15/2021   Procedure: OPEN REDUCTION INTERNAL FIXATION (ORIF)RIGHT  DISTAL RADIAL FRACTURE;  Surgeon: Cammy Copa, MD;  Location: [facility] OR;  Service: Orthopedics;  Laterality: Right;   Social History   Occupational History   Not on file  Tobacco Use   Smoking status: Never   Smokeless tobacco: Never  Vaping Use   Vaping Use: Never used  Substance and Sexual Activity   Alcohol use: No   Drug use: No   Sexual activity: Not on file

## 2021-07-12 ENCOUNTER — Encounter: Payer: Self-pay | Admitting: Orthopedic Surgery

## 2021-07-13 ENCOUNTER — Other Ambulatory Visit: Payer: Self-pay | Admitting: Surgical

## 2021-07-13 MED ORDER — ONDANSETRON HCL 4 MG PO TABS: 4.0000 mg | ORAL_TABLET | Freq: Three times a day (TID) | ORAL | 0 refills | Status: DC | PRN

## 2021-07-14 ENCOUNTER — Other Ambulatory Visit: Payer: Self-pay | Admitting: Surgical

## 2021-07-14 ENCOUNTER — Telehealth: Payer: Self-pay | Admitting: Orthopedic Surgery

## 2021-07-14 NOTE — Telephone Encounter (Signed)
Received call from atty Elgie Congo [phone] checking status of request for records from 6/6. I faxed 06/05/21, I refaxed [phone]. Ph [phone], Mitzi Davenport

## 2021-08-02 ENCOUNTER — Encounter: Payer: Self-pay | Admitting: Family

## 2021-08-02 ENCOUNTER — Other Ambulatory Visit: Payer: Self-pay

## 2021-08-02 DIAGNOSIS — R000 Tachycardia, unspecified: Secondary | ICD-10-CM

## 2021-08-02 MED ORDER — METOPROLOL SUCCINATE ER 25 MG PO TB24: 25.0000 mg | ORAL_TABLET | Freq: Every day | ORAL | 0 refills | Status: DC

## 2021-08-11 NOTE — Progress Notes (Signed)
Patient ID: Tina Manning, female    DOB: [birth year]  MRN: [id number]  CC: Annual Physical Exam   Subjective: Tina Manning is a 30 y.o. female who presents for annual physical exam.   Her concerns today include:  Recently noticed a spot on left breast. Has gotten larger in size and hurts to touch. Mother diagnosed with breast cancer at least 10 to 14 years ago.   Periods are heavier than normal. Lasting 2.5 weeks. Tried birth control and not helping. Endorses pain and feeling faint. Her mother had similar experience and which resulted in hysterectomy and D&C. Mother also with history of endometriosis. Was told from last Gynecologist that she should lose 60 pounds and it would cure everything.   Concern for diabetes as both maternal and paternal sides of family have this.  Doing ok on Albuterol. Appointment scheduled tomorrow with Pulmonology.   Still taking Metoprolol for tachycardia. Would like referral back to Cardiology.    Patient Active Problem List   Diagnosis Date Noted   Hyperlipidemia 08/15/2021   Prediabetes 08/15/2021   Morbid obesity due to excess calories (HCC) 08/15/2021   Closed fracture of lower end of right radius with routine healing    Anxiety 08/09/2011   Endometriosis 03/27/2009   Tachycardia 11/27/1998   Asthma 04/24/1994     Current Outpatient Medications on File Prior to Visit  Medication Sig Dispense Refill   albuterol (VENTOLIN HFA) 108 (90 Base) MCG/ACT inhaler Inhale 1-2 puffs into the lungs every 6 (six) hours as needed for wheezing or shortness of breath. 6.7 g 1   gabapentin (NEURONTIN) 300 MG capsule TAKE 1 CAPSULE(300 MG) BY MOUTH THREE TIMES DAILY 30 capsule 0   ipratropium-albuterol (DUONEB) 0.5-2.5 (3) MG/3ML SOLN Take 3 mLs by nebulization in the morning, at noon, in the evening, and at bedtime.     methocarbamol (ROBAXIN) 500 MG tablet TAKE 1 TABLET(500 MG) BY MOUTH EVERY 8 HOURS AS NEEDED 30 tablet 0   metoprolol succinate  (TOPROL-XL) 25 MG 24 hr tablet Take 1 tablet (25 mg total) by mouth daily. 90 tablet 0   traMADol (ULTRAM) 50 MG tablet 1 po q hs prn pain 30 tablet 0   No current facility-administered medications on file prior to visit.    Allergies  Allergen Reactions   Bee Venom Anaphylaxis   Ketorolac Tromethamine Palpitations and Shortness Of Breath   Penicillins Other (See Comments)    Intolerance     Social History   Socioeconomic History   Marital status: Married    Spouse name: Not on file   Number of children: Not on file   Years of education: Not on file   Highest education level: Not on file  Occupational History   Not on file  Tobacco Use   Smoking status: Never   Smokeless tobacco: Never  Vaping Use   Vaping Use: Never used  Substance and Sexual Activity   Alcohol use: No   Drug use: No   Sexual activity: Not on file  Other Topics Concern   Not on file  Social History Narrative   Not on file   Social Determinants of Health   Financial Resource Strain: Not on file  Food Insecurity: Not on file  Transportation Needs: Not on file  Physical Activity: Not on file  Stress: Not on file  Social Connections: Not on file  Intimate Partner Violence: Not on file    History reviewed. No pertinent family history.  Past Surgical History:  Procedure Laterality Date   CHOLECYSTECTOMY     OPEN REDUCTION INTERNAL FIXATION (ORIF) DISTAL RADIAL FRACTURE Right 05/15/2021   Procedure: OPEN REDUCTION INTERNAL FIXATION (ORIF)RIGHT  DISTAL RADIAL FRACTURE;  Surgeon: Cammy Copa, MD;  Location: [facility];  Service: Orthopedics;  Laterality: Right;    ROS: Review of Systems Negative except as stated above  PHYSICAL EXAM: BP 139/68 (BP Location: Left Arm, Patient Position: Sitting, Cuff Size: Large)   Pulse (!) 117   Temp 98.2 F (36.8 C)   Resp 18   Ht 5' 4.96" (1.65 m)   Wt (!) 306 lb 3.2 oz (138.9 kg)   LMP 08/08/2021   SpO2 96%   BMI 51.02 kg/m   Physical  Exam Exam conducted with a chaperone present.  HENT:     Head: Normocephalic and atraumatic.     Right Ear: Tympanic membrane, ear canal and external ear normal.     Left Ear: Tympanic membrane, ear canal and external ear normal.  Eyes:     Extraocular Movements: Extraocular movements intact.     Conjunctiva/sclera: Conjunctivae normal.     Pupils: Pupils are equal, round, and reactive to light.  Cardiovascular:     Rate and Rhythm: Tachycardia present.     Pulses: Normal pulses.     Heart sounds: Normal heart sounds.  Pulmonary:     Effort: Pulmonary effort is normal.     Breath sounds: Normal breath sounds.  Chest:  Breasts:    Right: Normal.     Left: Normal.     Comments: Margorie John, CMA present during exam. Evidence of hyperpigmented skin tag located lower left breast. No evidence of drainage. Tender to palpation.  Abdominal:     General: Bowel sounds are normal.     Palpations: Abdomen is soft.  Genitourinary:    Vagina: Normal.     Cervix: Normal.     Uterus: Normal.      Adnexa: Right adnexa normal and left adnexa normal.     Comments: Margorie John, CMA present during exam.  Musculoskeletal:        General: Normal range of motion.     Cervical back: Normal range of motion and neck supple.  Skin:    General: Skin is warm and dry.     Capillary Refill: Capillary refill takes less than 2 seconds.  Neurological:     General: No focal deficit present.     Mental Status: She is alert and oriented to person, place, and time.  Psychiatric:        Mood and Affect: Mood normal.        Behavior: Behavior normal.    ASSESSMENT AND PLAN: 1. Annual physical exam: - Counseled on 150 minutes of exercise per week as tolerated, healthy eating (including decreased daily intake of saturated fats, cholesterol, added sugars, sodium), STI prevention, and routine healthcare maintenance.  2. Screening for metabolic disorder: - BMP last obtained 05/15/2021 and kidney function  normal at that time.  - Hepatic Function Panel to check liver function.  - Hepatic Function Panel  3. Screening for deficiency anemia: - CBC to screen for anemia. - CBC  4. Diabetes mellitus screening: - Hemoglobin A1c to screen for pre-diabetes/diabetes. - Hemoglobin A1c  5. Screening cholesterol level: - Lipid panel to screen for high cholesterol.  - Lipid panel  6. Thyroid disorder screen: - TSH to check thyroid function.  - TSH  7. Need for hepatitis C screening test: - Hepatitis C antibody to  screen for hepatitis C.  - Hepatitis C Antibody  8. Encounter for screening for HIV: - HIV antibody to screen for human immunodeficiency virus.  - HIV antibody (with reflex)  9. Pap smear for cervical cancer screening: - Cytology - PAP for cervical cancer screening.  - Cytology - PAP([place])  10. Routine screening for STI (sexually transmitted infection): - Cervicovaginal self-swab to screen for chlamydia, gonorrhea, trichomonas, bacterial vaginitis, and candida vaginitis. - Cervicovaginal ancillary only  11. Menorrhagia with irregular cycle: - Referral to Gynecology for further evaluation and management.  - Ambulatory referral to Gynecology  12. Left breast lump: 13. Family history of breast cancer in mother: - Concern for possible left breast lump. - Ultrasound left breast for further evaluation and management.  - US BREAST COMPLETE UNI LEFT INC AXILLA; Future  14. Skin tag: - Located on left breast.  - Referral to Dermatology for further evaluation and management. - Ambulatory referral to Dermatology  15. Moderate persistent asthma, unspecified whether complicated: - Keep appointment scheduled 08/15/2021 with Sandrea Hughs, MD at Pulmonology.   16. Tachycardia: - Continue Metoprolol Succinate as prescribed.  - Referral to Cardiology for further evaluation and management.  - Ambulatory referral to Cardiology   Patient was given the opportunity to ask  questions.  Patient verbalized understanding of the plan and was able to repeat key elements of the plan. Patient was given clear instructions to go to Emergency Department or return to medical center if symptoms don't improve, worsen, or new problems develop.The patient verbalized understanding.   Orders Placed This Encounter  Procedures   US BREAST COMPLETE UNI LEFT INC AXILLA   Hepatic Function Panel   CBC   Lipid panel   TSH   Hemoglobin A1c   Hepatitis C Antibody   HIV antibody (with reflex)   Ambulatory referral to Gynecology   Ambulatory referral to Cardiology   Ambulatory referral to Dermatology    Follow-up with primary provider as scheduled.   Rema Fendt, NP

## 2021-08-14 ENCOUNTER — Encounter: Payer: Self-pay | Admitting: Family

## 2021-08-14 ENCOUNTER — Other Ambulatory Visit: Payer: Self-pay

## 2021-08-14 ENCOUNTER — Other Ambulatory Visit (HOSPITAL_COMMUNITY)
Admission: RE | Admit: 2021-08-14 | Discharge: 2021-08-14 | Disposition: A | Discharge status: Home / Self Care | Payer: 59 | Source: Ambulatory Visit | Attending: Family | Admitting: Family

## 2021-08-14 ENCOUNTER — Other Ambulatory Visit: Payer: Self-pay | Admitting: Orthopedic Surgery

## 2021-08-14 ENCOUNTER — Ambulatory Visit (INDEPENDENT_AMBULATORY_CARE_PROVIDER_SITE_OTHER): Payer: 59 | Admitting: Family

## 2021-08-14 VITALS — BP 139/68 | HR 117 | Temp 98.2°F | Resp 18 | Ht 64.96 in | Wt 306.2 lb

## 2021-08-14 DIAGNOSIS — M25531 Pain in right wrist: Secondary | ICD-10-CM

## 2021-08-14 DIAGNOSIS — Z124 Encounter for screening for malignant neoplasm of cervix: Secondary | ICD-10-CM | POA: Diagnosis present

## 2021-08-14 DIAGNOSIS — Z130 Encounter for screening for diseases of the blood and blood-forming organs and certain disorders involving the immune mechanism: Secondary | ICD-10-CM

## 2021-08-14 DIAGNOSIS — N6320 Unspecified lump in the left breast, unspecified quadrant: Secondary | ICD-10-CM

## 2021-08-14 DIAGNOSIS — N921 Excessive and frequent menstruation with irregular cycle: Secondary | ICD-10-CM

## 2021-08-14 DIAGNOSIS — J4540 Moderate persistent asthma, uncomplicated: Secondary | ICD-10-CM

## 2021-08-14 DIAGNOSIS — Z13228 Encounter for screening for other metabolic disorders: Secondary | ICD-10-CM

## 2021-08-14 DIAGNOSIS — Z1159 Encounter for screening for other viral diseases: Secondary | ICD-10-CM

## 2021-08-14 DIAGNOSIS — Z0000 Encounter for general adult medical examination without abnormal findings: Secondary | ICD-10-CM

## 2021-08-14 DIAGNOSIS — Z113 Encounter for screening for infections with a predominantly sexual mode of transmission: Secondary | ICD-10-CM | POA: Insufficient documentation

## 2021-08-14 DIAGNOSIS — Z131 Encounter for screening for diabetes mellitus: Secondary | ICD-10-CM | POA: Diagnosis not present

## 2021-08-14 DIAGNOSIS — S52551A Other extraarticular fracture of lower end of right radius, initial encounter for closed fracture: Secondary | ICD-10-CM

## 2021-08-14 DIAGNOSIS — R000 Tachycardia, unspecified: Secondary | ICD-10-CM

## 2021-08-14 DIAGNOSIS — Z13220 Encounter for screening for lipoid disorders: Secondary | ICD-10-CM

## 2021-08-14 DIAGNOSIS — Z1329 Encounter for screening for other suspected endocrine disorder: Secondary | ICD-10-CM

## 2021-08-14 DIAGNOSIS — L918 Other hypertrophic disorders of the skin: Secondary | ICD-10-CM

## 2021-08-14 DIAGNOSIS — Z23 Encounter for immunization: Secondary | ICD-10-CM

## 2021-08-14 DIAGNOSIS — Z114 Encounter for screening for human immunodeficiency virus [HIV]: Secondary | ICD-10-CM

## 2021-08-14 DIAGNOSIS — Z803 Family history of malignant neoplasm of breast: Secondary | ICD-10-CM

## 2021-08-14 NOTE — Progress Notes (Signed)
Pt presents for annual physical exam w/pap  Desires Tdap vaccine administered in left deltoid pt tolerated injection well

## 2021-08-15 ENCOUNTER — Encounter: Payer: Self-pay | Admitting: Family

## 2021-08-15 ENCOUNTER — Other Ambulatory Visit: Payer: Self-pay | Admitting: Family

## 2021-08-15 ENCOUNTER — Encounter: Payer: Self-pay | Admitting: Internal Medicine

## 2021-08-15 ENCOUNTER — Ambulatory Visit: Payer: 59

## 2021-08-15 ENCOUNTER — Ambulatory Visit (INDEPENDENT_AMBULATORY_CARE_PROVIDER_SITE_OTHER): Payer: 59 | Admitting: Internal Medicine

## 2021-08-15 ENCOUNTER — Ambulatory Visit (INDEPENDENT_AMBULATORY_CARE_PROVIDER_SITE_OTHER): Payer: 59

## 2021-08-15 DIAGNOSIS — J45909 Unspecified asthma, uncomplicated: Secondary | ICD-10-CM

## 2021-08-15 DIAGNOSIS — E785 Hyperlipidemia, unspecified: Secondary | ICD-10-CM

## 2021-08-15 DIAGNOSIS — R7303 Prediabetes: Secondary | ICD-10-CM | POA: Insufficient documentation

## 2021-08-15 LAB — HEPATIC FUNCTION PANEL
ALT: 24 IU/L (ref 0–32)
AST: 19 IU/L (ref 0–40)
Albumin: 4.5 g/dL (ref 3.9–5.0)
Alkaline Phosphatase: 96 IU/L (ref 44–121)
Bilirubin Total: <0.2 mg/dL (ref 0.0–1.2)
Bilirubin, Direct: <0.1 mg/dL (ref 0.00–0.40)
Total Protein: 6.4 g/dL (ref 6.0–8.5)

## 2021-08-15 LAB — CERVICOVAGINAL ANCILLARY ONLY
Bacterial Vaginitis (gardnerella): NEGATIVE
Candida Glabrata: NEGATIVE
Candida Vaginitis: NEGATIVE
Chlamydia: NEGATIVE
Comment: NEGATIVE
Comment: NEGATIVE
Comment: NEGATIVE
Comment: NEGATIVE
Comment: NEGATIVE
Comment: NORMAL
Neisseria Gonorrhea: NEGATIVE
Trichomonas: NEGATIVE

## 2021-08-15 LAB — TSH: TSH: 2.29 u[IU]/mL (ref 0.450–4.500)

## 2021-08-15 LAB — CBC
Hematocrit: 41.5 % (ref 34.0–46.6)
Hemoglobin: 13.3 g/dL (ref 11.1–15.9)
MCH: 25.7 pg — ABNORMAL LOW (ref 26.6–33.0)
MCHC: 32 g/dL (ref 31.5–35.7)
MCV: 80 fL (ref 79–97)
Platelets: 438 10*3/uL (ref 150–450)
RBC: 5.17 x10E6/uL (ref 3.77–5.28)
RDW: 14.7 % (ref 11.7–15.4)
WBC: 10.9 10*3/uL — ABNORMAL HIGH (ref 3.4–10.8)

## 2021-08-15 LAB — LIPID PANEL
Chol/HDL Ratio: 5.6 ratio — ABNORMAL HIGH (ref 0.0–4.4)
Cholesterol, Total: 211 mg/dL — ABNORMAL HIGH (ref 100–199)
HDL: 38 mg/dL — ABNORMAL LOW (ref >39)
LDL Chol Calc (NIH): 139 mg/dL — ABNORMAL HIGH (ref 0–99)
Triglycerides: 188 mg/dL — ABNORMAL HIGH (ref 0–149)
VLDL Cholesterol Cal: 34 mg/dL (ref 5–40)

## 2021-08-15 LAB — HEMOGLOBIN A1C
Est. average glucose Bld gHb Est-mCnc: 123 mg/dL
Hgb A1c MFr Bld: 5.9 % — ABNORMAL HIGH (ref 4.8–5.6)

## 2021-08-15 LAB — HEPATITIS C ANTIBODY: Hep C Virus Ab: <0.1 s/co ratio (ref 0.0–0.9)

## 2021-08-15 LAB — HIV ANTIBODY (ROUTINE TESTING W REFLEX): HIV Screen 4th Generation wRfx: NONREACTIVE

## 2021-08-15 MED ORDER — ATORVASTATIN CALCIUM 40 MG PO TABS: 40.0000 mg | ORAL_TABLET | Freq: Every day | ORAL | 0 refills | Status: DC

## 2021-08-15 MED ORDER — MOMETASONE FURO-FORMOTEROL FUM 200-5 MCG/ACT IN AERO: INHALATION_SPRAY | RESPIRATORY_TRACT | 11 refills | Status: DC

## 2021-08-15 MED ORDER — METHYLPREDNISOLONE ACETATE 80 MG/ML IJ SUSP
120.0000 mg | Freq: Once | INTRAMUSCULAR | Status: AC
  Administered 2021-08-15: 120 mg via INTRAMUSCULAR

## 2021-08-15 MED ORDER — BREZTRI AEROSPHERE 160-9-4.8 MCG/ACT IN AERO: 2.0000 | INHALATION_SPRAY | Freq: Two times a day (BID) | RESPIRATORY_TRACT | 0 refills | Status: DC

## 2021-08-15 NOTE — Progress Notes (Signed)
Tina Manning, female    DOB: 06/22/1991,    MRN: [id number]   Brief patient profile:  30 yowf never smoker with childhood asthma typically needing saba only with PE while living in [place] where became more of a chronic issue around 2020 but still only on saba as "maint" and required  intubation x 10 days  Thanksgiving 2021 at [facility] in [place] referred to pulmonary clinic 08/15/2021 by Ricky Stabs, NP for asthma.  Prior to the chronic asthma problem in 2020 was maintaining  wt  around 230    History of Present Illness  08/15/2021  Pulmonary/ 1st office eval/Brently Voorhis no maint rx  since Feb 2022 arrived in [place] Chief Complaint  Patient presents with   Consult    Patient reports increased shortness of breath in last 2 months at rest and with exertion.   Dyspnea:  can finish [employer] grocery store s stopping  Cough: none Sleep: 45 degrees x sev years SABA use: saba hfa avg 4-5 x per day/finished pred x 5 days prior to OV  - last dose 3.5 h prior to OV    No obvious day to day or daytime variability or assoc excess/ purulent sputum or mucus plugs or hemoptysis or cp or chest tightness, subjective wheeze or overt sinus or hb symptoms.   Sleeping  without nocturnal  or early am exacerbation  of respiratory  c/o's or need for noct saba. Also denies any obvious fluctuation of symptoms with weather or environmental changes or other aggravating or alleviating factors except as outlined above   No unusual exposure hx or h/o childhood pna/ asthma or knowledge of premature birth.  Current Allergies, Complete Past Medical History, Past Surgical History, Family History, and Social History were reviewed in [employer] record.  ROS  The following are not active complaints unless bolded Hoarseness, sore throat, dysphagia, dental problems, itching, sneezing,  nasal congestion or discharge of excess mucus or purulent secretions, ear ache,   fever, chills, sweats, unintended  wt loss or wt gain, classically pleuritic or exertional cp,  orthopnea pnd or arm/hand swelling  or leg swelling, presyncope, palpitations, abdominal pain, anorexia, nausea, vomiting, diarrhea  or change in bowel habits or change in bladder habits, change in stools or change in urine, dysuria, hematuria,  rash, arthralgias, visual complaints, headache, numbness, weakness or ataxia or problems with walking or coordination,  change in mood or  memory.           Past Medical History:  Diagnosis Date   Anemia    Asthma    Complication of anesthesia    Diabetes mellitus without complication (HCC)    Endometriosis    PONV (postoperative nausea and vomiting)    Tachycardia     Outpatient Medications Prior to Visit  Medication Sig Dispense Refill   albuterol (VENTOLIN HFA) 108 (90 Base) MCG/ACT inhaler Inhale 1-2 puffs into the lungs every 6 (six) hours as needed for wheezing or shortness of breath. 6.7 g 1   atorvastatin (LIPITOR) 40 MG tablet Take 1 tablet (40 mg total) by mouth daily. 90 tablet 0   gabapentin (NEURONTIN) 300 MG capsule TAKE 1 CAPSULE(300 MG) BY MOUTH THREE TIMES DAILY 30 capsule 0   ipratropium-albuterol (DUONEB) 0.5-2.5 (3) MG/3ML SOLN Take 3 mLs by nebulization in the morning, at noon, in the evening, and at bedtime.     methocarbamol (ROBAXIN) 500 MG tablet TAKE 1 TABLET(500 MG) BY MOUTH EVERY 8 HOURS AS NEEDED 30 tablet  0   metoprolol succinate (TOPROL-XL) 25 MG 24 hr tablet Take 1 tablet (25 mg total) by mouth daily. 90 tablet 0   traMADol (ULTRAM) 50 MG tablet 1 po q hs prn pain 30 tablet 0   ondansetron (ZOFRAN) 4 MG tablet Take 1 tablet (4 mg total) by mouth every 8 (eight) hours as needed for nausea or vomiting. (Patient not taking: Reported on 08/15/2021) 20 tablet 0   No facility-administered medications prior to visit.     Objective:     BP 130/80 (BP Location: Left Arm, Patient Position: Sitting, Cuff Size: Large)   Pulse (!) 125   Temp 97.8 F (36.6 C)  (Oral)   Ht 5\' 5"  (1.651 m)   Wt (!) 311 lb (141.1 kg)   LMP 08/08/2021   SpO2 96% Comment: ra  BMI 51.75 kg/m   SpO2: 96 % (ra)  Obese pleasant amb wf nad    HEENT : pt wearing mask not removed for exam due to covid -19 concerns.    NECK :  without JVD/Nodes/TM/ nl carotid upstrokes bilaterally   LUNGS: no acc muscle use,  Nl contour chest which is clear to A and P bilaterally without cough on insp or exp maneuvers   CV:  RRR  no s3 or murmur or increase in P2, and no edema   ABD:  soft and nontender with nl inspiratory excursion in the supine position. No bruits or organomegaly appreciated, bowel sounds nl  MS:  Nl gait/ ext warm without deformities, calf tenderness, cyanosis or clubbing No obvious joint restrictions   SKIN: warm and dry without lesions    NEURO:  alert, approp, nl sensorium with  no motor or cerebellar deficits apparent.   CXR PA and Lateral:   08/15/2021 :    I personally reviewed images and agree with radiology impression as follows:      Labs  reviewed:      Chemistry      Component Value Date/Time   NA 136 05/15/2021 1410   K 4.0 05/15/2021 1410   CL 103 05/15/2021 1410   CO2 22 05/15/2021 1410   BUN 11 05/15/2021 1410   CREATININE 0.73 05/15/2021 1410      Component Value Date/Time   CALCIUM 9.0 05/15/2021 1410   ALKPHOS 96 08/14/2021 1724   AST 19 08/14/2021 1724   ALT 24 08/14/2021 1724   BILITOT <0.2 08/14/2021 1724        Lab Results  Component Value Date   WBC 10.9 (H) 08/14/2021   HGB 13.3 08/14/2021   HCT 41.5 08/14/2021   MCV 80 08/14/2021   PLT 438 08/14/2021     No results found for: DDIMER    Lab Results  Component Value Date   TSH 2.290 08/14/2021        CXR PA and Lateral:   08/15/2021 :    I personally reviewed images and agree with radiology impression as follows:    Low lung volumes without evidence of acute cardiopulmonary disease     Assessment   No problem-specific Assessment & Plan notes  found for this encounter.     08/17/2021, MD 08/15/2021

## 2021-08-15 NOTE — Progress Notes (Signed)
Liver function normal.   Thyroid function normal.   Hepatitis C negative.   HIV negative.   No anemia.   White blood cells, sometimes called infection fighters, improved and closer to normal since 3 months ago.   Cholesterol higher than expected. High cholesterol may increase risk of heart attack and/or stroke. Consider eating more fruits, vegetables, and lean baked meats such as chicken or fish. Moderate intensity exercise at least 150 minutes as tolerated per week may help as well.   Atorvastatin (Lipitor) prescribed for high cholesterol. Encouraged to recheck cholesterol in 4 to 6 weeks at lab only visit.   Hemoglobin A1c is consistent with pre-diabetes. Practice healthy eating habits of fresh fruit and vegetables, lean baked meats such as chicken, fish, and [place]; limit breads, rice, pastas, and desserts; practice regular aerobic exercise (at least 150 minutes a week as tolerated). No medication needed at the moment. Encouraged to recheck in 6 months.

## 2021-08-15 NOTE — Assessment & Plan Note (Signed)
Wt around 230 prior to chronic asthma 2020   Body mass index is 51.75 kg/m. (311 lb)  -  trending up  Lab Results  Component Value Date   TSH 2.290 08/14/2021      Contributing to doe and risk of GERD >>>   reviewed the need and the process to achieve and maintain neg calorie balance > defer f/u primary care including intermittently monitoring thyroid status            Each maintenance medication was reviewed in detail including emphasizing most importantly the difference between maintenance and prns and under what circumstances the prns are to be triggered using an action plan format where appropriate.  Total time for H and P, chart review, counseling, reviewing hfa  device(s) and generating customized AVS unique to this office visit / same day charting = 49 min

## 2021-08-15 NOTE — Telephone Encounter (Signed)
Last filled 07/10/2021 Qty: 30 Refills: 0

## 2021-08-15 NOTE — Patient Instructions (Addendum)
Plan A = Automatic = Always=  Dulera 200 (or Breztri) Take 2 puffs first thing in am and then another 2 puffs about 12 hours later.  And take Pantoprazole (protonix) 40 mg   Take  30-60 min before first meal of the day and Pepcid (famotidine)  20 mg after supper until return to office - this is the best way to tell whether stomach acid is contributing to your problem.     Work on inhaler technique:  relax and gently blow all the way out then take a nice smooth full deep breath back in, triggering the inhaler at same time you start breathing in.  Hold for up to 5 seconds if you can. Blow out thru nose. Rinse and gargle with water when done.  If mouth or throat bother you at all,  try brushing teeth/gums/tongue with arm and hammer toothpaste/ make a slurry and gargle and spit out.      Plan B = Backup (to supplement plan A, not to replace it) Only use your albuterol inhaler as a rescue medication to be used if you can't catch your breath by resting or doing a relaxed purse lip breathing pattern.  - The less you use it, the better it will work when you need it. - Ok to use the inhaler up to 2 puffs  every 4 hours if you must but call for appointment if use goes up over your usual need - Don't leave home without it !!  (think of it like the spare tire for your car)   Plan C = Crisis (instead of Plan B but only if Plan B stops working) - only use your albuterol nebulizer if you first try Plan B and it fails to help > ok to use the nebulizer up to every 4 hours but if start needing it regularly call for immediate appointment  Also ok to Try albuterol 15 min before an activity (on alternating days)  that you know would make you short of breath and see if it makes any difference and if makes none then don't take albuterol after activity unless you can't catch your breath as this means it's the resting that helps, not the albuterol.      Plan D = Doctor - call me if B and C not adequate  Plan E = ER -  go to ER or call 911 if all else fails    Depomedrol 120 mg IM    GERD (REFLUX)  is an extremely common cause of respiratory symptoms just like yours , many times with no obvious heartburn at all.    It can be treated with medication, but also with lifestyle changes including elevation of the head of your bed (ideally with 6 -8inch blocks under the headboard of your bed),  Smoking cessation, avoidance of late meals, excessive alcohol, and avoid fatty foods, chocolate, peppermint, colas, red wine, and acidic juices such as orange juice.  NO MINT OR MENTHOL PRODUCTS SO NO COUGH DROPS  USE SUGARLESS CANDY INSTEAD (Jolley ranchers or Stover's or Life Savers) or even ice chips will also do - the key is to swallow to prevent all throat clearing. NO OIL BASED VITAMINS - use powdered substitutes.  Avoid fish oil when coughing.   Sign for discharge summary from [facility]  [place]  Please schedule a follow up office visit in 4 weeks, sooner if needed

## 2021-08-15 NOTE — Telephone Encounter (Signed)
Gabapentin Last filled 07/14/2021 Qty: 30 capsule Refills:0 Patient is taking one capsule by mouth three times daily.   Methocarbamol Last filled 07/14/2021 Qty: 30 Tablets Refills: 0 Patient is taking one tablet by mouth every 8 hours as needed.

## 2021-08-15 NOTE — Telephone Encounter (Signed)
Dr. Wert, please see mychart message sent by pt and advise. ?

## 2021-08-15 NOTE — Assessment & Plan Note (Addendum)
Onset age 30 - intubation [place] in Nov 2021 - 08/15/2021  After extensive coaching inhaler device,  effectiveness =   75% > try dulera 200 (breztri sample x one)   DDX of  difficult airways management almost all start with A and  include Adherence, Ace Inhibitors, Acid Reflux, Active Sinus Disease, Alpha 1 Antitripsin deficiency, Anxiety masquerading as Airways dz,  ABPA,  Allergy(esp in young), Aspiration (esp in elderly), Adverse effects of meds,  Active smoking or vaping, A bunch of PE's (a small clot burden can't cause this syndrome unless there is already severe underlying pulm or vascular dz with poor reserve) plus two Bs  = Bronchiectasis and Beta blocker use..and one C= CHF  Adherence is always the initial "prime suspect" and is a multilayered concern that requires a "trust but verify" approach in every patient - starting with knowing how to use medications, especially inhalers, correctly, keeping up with refills and understanding the fundamental difference between maintenance and prns vs those medications only taken for a very short course and then stopped and not refilled.  - see hfa teaching - return with all meds in hand using a trust but verify approach to confirm accurate Medication  Reconciliation The principal here is that until we are certain that the  patients are doing what we've asked, it makes no sense to ask them to do more.   ? Acid (or non-acid) GERD > always difficult to exclude as up to 75% of pts in some series report no assoc GI/ Heartburn symptoms> rec max (24h)  acid suppression and diet restrictions/ reviewed and instructions given in writing.   ? Allergy > depomedrol 120 mg IM and high dose ics, do profile when returns off steroids for  > 1 week  ? Anxiety/depression/deconditioning  > usually at the bottom of this list of usual suspects but   may interfere with adherence and also interpretation of response or lack thereof to symptom management which can be quite  subjective.  - instructed on difference between doe from deconditioning / obesity vs asthma exac by activity but most likely the problme is the former > latter   ? BB effects > unlikely at such low doses  ? A bunch of PEs > always a risk in the [place] but no acute changes recently  >>> f/u in 6 weeks, call sooner if needed and follow action plan in meantime as in  AVS

## 2021-08-15 NOTE — Progress Notes (Signed)
Gonorrhea, Chlamydia, Trichomonas, Bacterial Vaginitis, and Candida Vaginitis (sometimes called yeast infection) negative.

## 2021-08-15 NOTE — Telephone Encounter (Signed)
She'll need 2 more samples of breztri to make it last until the 8th

## 2021-08-16 ENCOUNTER — Telehealth: Payer: Self-pay | Admitting: *Deleted

## 2021-08-16 ENCOUNTER — Encounter: Payer: Self-pay | Admitting: *Deleted

## 2021-08-16 MED ORDER — TRAMADOL HCL 50 MG PO TABS
ORAL_TABLET | ORAL | 0 refills | Status: DC
Start: 2021-08-16 — End: 2021-09-12

## 2021-08-16 MED ORDER — GABAPENTIN 300 MG PO CAPS: 300.0000 mg | ORAL_CAPSULE | Freq: Three times a day (TID) | ORAL | 0 refills | Status: DC

## 2021-08-16 MED ORDER — METHOCARBAMOL 500 MG PO TABS: 500.0000 mg | ORAL_TABLET | Freq: Three times a day (TID) | ORAL | 0 refills | Status: DC | PRN

## 2021-08-16 NOTE — Telephone Encounter (Signed)
Primary Pulmonologist: Wert Last office visit and with whom: 08/15/2021 Wert What do we see them for (pulmonary problems): Asthma Last OV assessment/plan:   Assessment & Plan Note by Nyoka Cowden, MD at 08/15/2021 9:41 AM  Author: Nyoka Cowden, MD Author Type: Physician Filed: 08/15/2021  2:05 PM  Note Status: [place] Cater: Cosign Not Required Encounter Date: 08/15/2021  Problem: Asthma  Editor: Nyoka Cowden, MD (Physician)      Prior Versions: 1. Nyoka Cowden, MD (Physician) at 08/15/2021  9:45 AM - Written    Onset age 30 - intubation [place] in Nov 2021 - 08/15/2021  After extensive coaching inhaler device,  effectiveness =   75% > try dulera 200 (breztri sample x one)    DDX of  difficult airways management almost all start with A and  include Adherence, Ace Inhibitors, Acid Reflux, Active Sinus Disease, Alpha 1 Antitripsin deficiency, Anxiety masquerading as Airways dz,  ABPA,  Allergy(esp in young), Aspiration (esp in elderly), Adverse effects of meds,  Active smoking or vaping, A bunch of PE's (a small clot burden can't cause this syndrome unless there is already severe underlying pulm or vascular dz with poor reserve) plus two Bs  = Bronchiectasis and Beta blocker use..and one C= CHF   Adherence is always the initial "prime suspect" and is a multilayered concern that requires a "trust but verify" approach in every patient - starting with knowing how to use medications, especially inhalers, correctly, keeping up with refills and understanding the fundamental difference between maintenance and prns vs those medications only taken for a very short course and then stopped and not refilled.  - see hfa teaching - return with all meds in hand using a trust but verify approach to confirm accurate Medication  Reconciliation The principal here is that until we are certain that the  patients are doing what we've asked, it makes no sense to ask them to do more.    ? Acid (or non-acid) GERD  > always difficult to exclude as up to 75% of pts in some series report no assoc GI/ Heartburn symptoms> rec max (24h)  acid suppression and diet restrictions/ reviewed and instructions given in writing.    ? Allergy > depomedrol 120 mg IM and high dose ics, do profile when returns off steroids for  > 1 week   ? Anxiety/depression/deconditioning  > usually at the bottom of this list of usual suspects but   may interfere with adherence and also interpretation of response or lack thereof to symptom management which can be quite subjective.  - instructed on difference between doe from deconditioning / obesity vs asthma exac by activity but most likely the problme is the former > latter    ? BB effects > unlikely at such low doses   ? A bunch of PEs > always a risk in the MO but no acute changes recently   >>> f/u in 6 weeks, call sooner if needed and follow action plan in meantime as in  AVS            Patient Instructions by Nyoka Cowden, MD at 08/15/2021 9:00 AM  Author: Nyoka Cowden, MD Author Type: Physician Filed: 08/15/2021  9:27 AM  Note Status: Addendum Cosign: Cosign Not Required Encounter Date: 08/15/2021  Editor: Nyoka Cowden, MD (Physician)      Prior Versions: 1. Nyoka Cowden, MD (Physician) at 08/15/2021  9:24 AM - Addendum   2. Nyoka Cowden,  MD (Physician) at 08/15/2021  9:22 AM - Signed    Plan A = Automatic = Always=  Dulera 200 (or Breztri) Take 2 puffs first thing in am and then another 2 puffs about 12 hours later.  And take Pantoprazole (protonix) 40 mg   Take  30-60 min before first meal of the day and Pepcid (famotidine)  20 mg after supper until return to office - this is the best way to tell whether stomach acid is contributing to your problem.     Work on inhaler technique:  relax and gently blow all the way out then take a nice smooth full deep breath back in, triggering the inhaler at same time you start breathing in.  Hold for up to 5 seconds if you  can. Blow out thru nose. Rinse and gargle with water when done.  If mouth or throat bother you at all,  try brushing teeth/gums/tongue with arm and hammer toothpaste/ make a slurry and gargle and spit out.       Plan B = Backup (to supplement plan A, not to replace it) Only use your albuterol inhaler as a rescue medication to be used if you can't catch your breath by resting or doing a relaxed purse lip breathing pattern.  - The less you use it, the better it will work when you need it. - Ok to use the inhaler up to 2 puffs  every 4 hours if you must but call for appointment if use goes up over your usual need - Don't leave home without it !!  (think of it like the spare tire for your car)    Plan C = Crisis (instead of Plan B but only if Plan B stops working) - only use your albuterol nebulizer if you first try Plan B and it fails to help > ok to use the nebulizer up to every 4 hours but if start needing it regularly call for immediate appointment   Also ok to Try albuterol 15 min before an activity (on alternating days)  that you know would make you short of breath and see if it makes any difference and if makes none then don't take albuterol after activity unless you can't catch your breath as this means it's the resting that helps, not the albuterol.       Plan D = Doctor - call me if B and C not adequate   Plan E = ER - go to ER or call 911 if all else fails     Depomedrol 120 mg IM      GERD (REFLUX)  is an extremely common cause of respiratory symptoms just like yours , many times with no obvious heartburn at all.     It can be treated with medication, but also with lifestyle changes including elevation of the head of your bed (ideally with 6 -8inch blocks under the headboard of your bed),  Smoking cessation, avoidance of late meals, excessive alcohol, and avoid fatty foods, chocolate, peppermint, colas, red wine, and acidic juices such as orange juice.  NO MINT OR MENTHOL PRODUCTS  SO NO COUGH DROPS  USE SUGARLESS CANDY INSTEAD (Jolley ranchers or Stover's or Life Savers) or even ice chips will also do - the key is to swallow to prevent all throat clearing. NO OIL BASED VITAMINS - use powdered substitutes.  Avoid fish oil when coughing.    Sign for discharge summary from [facility]  [place]   Please schedule a  follow up office visit in 4 weeks, sooner if needed             Orthostatic Vitals Recorded in This Encounter   08/15/2021  0852     Patient Position: Sitting  BP Location: Left Arm  Cuff Size: Large   Instructions  Plan A = Automatic = Always=  Dulera 200 (or Breztri) Take 2 puffs first thing in am and then another 2 puffs about 12 hours later.  And take Pantoprazole (protonix) 40 mg   Take  30-60 min before first meal of the day and Pepcid (famotidine)  20 mg after supper until return to office - this is the best way to tell whether stomach acid is contributing to your problem.     Work on inhaler technique:  relax and gently blow all the way out then take a nice smooth full deep breath back in, triggering the inhaler at same time you start breathing in.  Hold for up to 5 seconds if you can. Blow out thru nose. Rinse and gargle with water when done.  If mouth or throat bother you at all,  try brushing teeth/gums/tongue with arm and hammer toothpaste/ make a slurry and gargle and spit out.       Plan B = Backup (to supplement plan A, not to replace it) Only use your albuterol inhaler as a rescue medication to be used if you can't catch your breath by resting or doing a relaxed purse lip breathing pattern.  - The less you use it, the better it will work when you need it. - Ok to use the inhaler up to 2 puffs  every 4 hours if you must but call for appointment if use goes up over your usual need - Don't leave home without it !!  (think of it like the spare tire for your car)    Plan C = Crisis (instead of Plan B but only if Plan B stops  working) - only use your albuterol nebulizer if you first try Plan B and it fails to help > ok to use the nebulizer up to every 4 hours but if start needing it regularly call for immediate appointment   Also ok to Try albuterol 15 min before an activity (on alternating days)  that you know would make you short of breath and see if it makes any difference and if makes none then don't take albuterol after activity unless you can't catch your breath as this means it's the resting that helps, not the albuterol.       Plan D = Doctor - call me if B and C not adequate   Plan E = ER - go to ER or call 911 if all else fails     Depomedrol 120 mg IM      GERD (REFLUX)  is an extremely common cause of respiratory symptoms just like yours , many times with no obvious heartburn at all.     It can be treated with medication, but also with lifestyle changes including elevation of the head of your bed (ideally with 6 -8inch blocks under the headboard of your bed),  Smoking cessation, avoidance of late meals, excessive alcohol, and avoid fatty foods, chocolate, peppermint, colas, red wine, and acidic juices such as orange juice.  NO MINT OR MENTHOL PRODUCTS SO NO COUGH DROPS  USE SUGARLESS CANDY INSTEAD (Jolley ranchers or Stover's or Life Savers) or even ice chips will also do - the key is  to swallow to prevent all throat clearing. NO OIL BASED VITAMINS - use powdered substitutes.  Avoid fish oil when coughing.    Sign for discharge summary from [facility] [place]  [place]   Please schedule a follow up office visit in 4 weeks, sooner if needed       Was appointment offered to patient (explain)?  No, she was seen on 08/15/21   Reason for call: Severe headache (top of head, around eyes and ears).  Used Breztri inhaler 2 puffs last night, pain began about 5 pm yesterday.  She used 2 puffs this am.  She took some ibuprofen and it took the edge off.  Says she has had worse headaches, but this is  pretty severe.  She did not get much sleep last night.  The inhaler helped her breathing and she has not had to use her albuterol inhaler.  She wants to know if this is normal after using the inhaler.  I reviewed the common side effects of the Breztri inhaler with her.  Advised I would get a message to Dr. Sherene SiresWert and once he responds we will call her back.  She verbalized understanding.  Dr. Sherene SiresWert, Please advise.  Thank you.  (examples of things to ask: : When did symptoms start? Fever? Cough? Productive? Color to sputum? More sputum than usual? Wheezing? Have you needed increased oxygen? Are you taking your respiratory medications? What over the counter measures have you tried?)  Allergies  Allergen Reactions   Bee Venom Anaphylaxis   Ketorolac Tromethamine Palpitations and Shortness Of Breath   Penicillins Other (See Comments)    Intolerance     Immunization History  Administered Date(s) Administered   PFIZER(Purple Top)SARS-COV-2 Vaccination 01/27/2020, 02/25/2020

## 2021-08-16 NOTE — Telephone Encounter (Signed)
Called and spoke with patient, provided recommendations per Dr. Sherene Sires.  Advised to call if recommendations does not improve headache.  She verbalized understanding.  Nothing further needed.

## 2021-08-16 NOTE — Telephone Encounter (Signed)
Mychart message: I hate to bug yall I know yall very busy but is a severe headache normal with the inhaler I've had one all night into this morning it feels like my whole top head around my eyes nose and ears is going to bust its hurting so bad the inhaler is working good tho I havnt had to use my albuterol inhaler I was just woundering if this is normal   ATC x1, no answer, VM full.

## 2021-08-16 NOTE — Telephone Encounter (Signed)
      Common side effects may include: throat pain or irritation; white patches in your mouth or throat; headache, joint pain, muscle spasms; pounding heartbeats, feeling anxious; high blood sugar levels; painful or difficult urination; nausea, diarrhea; cough, hoarse voice;

## 2021-08-16 NOTE — Telephone Encounter (Signed)
I haven't seen this before but try spreading out to one puff  4 x  a day instead of 2 every 12 or try 1 every 12 to see if better

## 2021-08-17 NOTE — Progress Notes (Signed)
Virtual Visit via Telephone Note  I connected with Tina Manning, on 08/21/2021 at 10:52 AM by telephone due to the COVID-19 pandemic and verified that I am speaking with the correct person using two identifiers.  Due to current restrictions/limitations of in-office visits due to the COVID-19 pandemic, this scheduled clinical appointment was converted to a telehealth visit.   Consent: I discussed the limitations, risks, security and privacy concerns of performing an evaluation and management service by telephone and the availability of in person appointments. I also discussed with the patient that there may be a patient responsible charge related to this service. The patient expressed understanding and agreed to proceed.   Location of Patient: Home  Location of Provider: [facility]   Persons participating in Telemedicine visit: Tina Addair Ricky Stabs, NP Margorie John, CMA   History of Present Illness: Tina Manning is a 30 year-old female who presents for anxiety depression.   ANXIETY: Reports panic attacks as a teenager. Returning since being intubated in 2021 related to asthma. Anxiety has increased since being intubated. Anxiety causing weakness, dizziness, and body aches. Noticed recently a rainstorm increased anxiety. Feels when out in stores there are a lot of people which causes her to become anxious and sweat. Feels that they are staring because she is sweating. Does not have anxiety daily. Difficulty sleeping. Has tried Melatonin and Benadryl without relief. Most nights getting 3 to 4 hours of sleep. Her husband is supportive during anxiety or panic attacks. Tried counseling in the past and was helping, would like to begin again.   Anxious mood: yes  Excessive worrying: yes Irritability: no  Sweating: yes Nausea: no Palpitations: sometimes  history of tachycardia  Hyperventilation: no Panic attacks: yes, last time  3 to 4 weeks ago  Depressed mood: no Insomnia: yes  Fatigue/loss of energy: yes Feelings of worthlessness: no Feelings of guilt: no Impaired concentration/indecisiveness: no Suicidal ideations, homicidal ideations, self-harm: no  Crying spells: sometimes  Recent Stressors/Life Changes: yes   Relationship problems: no   Family stress: yes     Financial stress: no    Job stress: yes    Recent death/loss: no  Depression screen [facility] 2/9 08/14/2021 06/23/2021  Decreased Interest 1 0  Down, Depressed, Hopeless 0 0  PHQ - 2 Score 1 0  Altered sleeping 3 0  Tired, decreased energy 3 0  Change in appetite 2 0  Feeling bad or failure about yourself  1 0  Trouble concentrating 1 0  Moving slowly or fidgety/restless 0 0  Suicidal thoughts 0 0  PHQ-9 Score 11 0  Difficult doing work/chores Somewhat difficult Not difficult at all     Past Medical History:  Diagnosis Date   Anemia    Asthma    Complication of anesthesia    Diabetes mellitus without complication (HCC)    Endometriosis    PONV (postoperative nausea and vomiting)    Tachycardia    Allergies  Allergen Reactions   Bee Venom Anaphylaxis   Ketorolac Tromethamine Palpitations and Shortness Of Breath   Penicillins Other (See Comments)    Intolerance     Current Outpatient Medications on File Prior to Visit  Medication Sig Dispense Refill   albuterol (VENTOLIN HFA) 108 (90 Base) MCG/ACT inhaler Inhale 1-2 puffs into the lungs every 6 (six) hours as needed for wheezing or shortness of breath. 6.7 g 1   atorvastatin (LIPITOR) 40 MG tablet Take 1 tablet (40 mg  total) by mouth daily. 90 tablet 0   Budeson-Glycopyrrol-Formoterol (BREZTRI AEROSPHERE) 160-9-4.8 MCG/ACT AERO Inhale 2 puffs into the lungs in the morning and at bedtime. 5.9 g 0   gabapentin (NEURONTIN) 300 MG capsule Take 1 capsule (300 mg total) by mouth 3 (three) times daily. 30 capsule 0   ipratropium-albuterol (DUONEB) 0.5-2.5 (3) MG/3ML SOLN Take 3 mLs by  nebulization in the morning, at noon, in the evening, and at bedtime.     methocarbamol (ROBAXIN) 500 MG tablet Take 1 tablet (500 mg total) by mouth every 8 (eight) hours as needed for muscle spasms. 30 tablet 0   metoprolol succinate (TOPROL-XL) 25 MG 24 hr tablet Take 1 tablet (25 mg total) by mouth daily. 90 tablet 0   mometasone-formoterol (DULERA) 200-5 MCG/ACT AERO Take 2 puffs first thing in am and then another 2 puffs about 12 hours later. 1 each 11   traMADol (ULTRAM) 50 MG tablet 1 po q hs prn pain 30 tablet 0   No current facility-administered medications on file prior to visit.    Observations/Objective: Alert and oriented x 3. Not in acute distress. Physical examination not completed as this is a telemedicine visit.  Assessment and Plan: 1. Generalized anxiety disorder: - Patient denies thoughts of self-harm, suicidal ideations, and homicidal ideations. - Begin Hydroxyzine as prescribed.  Do not drink alcohol or use illicit substances with with this medication.  Avoid driving or hazardous activity until you know how this medication will affect you. Your reactions could be impaired. Dizziness or fainting can cause falls, accidents, or severe injuries. Common side effects include drowsiness, nausea, constipation, loss of appetite, dry mouth, increased sweating. Call your provider if you have pounding heartbeats or fluttering in your chest, a light-headed feeling like you may pass out, easy bruising/unusal bleeding, vision change, difficult or painful urination, impotence/sexual problems, liver problems (right-sided upper stomach pain, itching, dark urine, yellowing of skin or eyes/jaundice, low levels of sodium in the body (headache, confusion, slurred speech, severe weakness, vomiting, loss of coordination, feeling unsteady), or manic episodes (racing thoughts, increased energy, decreased need for sleep, risk-taking behavior, being agitated, talkative) Seek medical attention  immediately if you have symptoms of serotonin syndrome such as agitation, hallucinations, fever, sweating, shivering, fast heart rate, muscle stiffness, twitching, loss of coordination, nausea, vomiting, or diarrhea Report any new or worsening symptoms to your provider, such as but not limited to: mood or behavior changes, anxiety, panic attacks, trouble sleeping, or if you feel impulsive, irritable, agitated, hostile, aggressive, restless, hyperactive (mentally or physically), more depressed, or have thoughts about suicide or hurting yourself - Referral to Norton Blizzard, LCSW for counseling services.  - Follow-up with primary provider in 4 weeks or sooner if needed.  - hydrOXYzine (VISTARIL) 25 MG capsule; Take 1 capsule (25 mg total) by mouth every 8 (eight) hours as needed.  Dispense: 30 capsule; Refill: 1  2. Insomnia, unspecified type: - Begin Trazodone as prescribed. May cause drowsiness. Counseled patient to not consume if operating heavy machinery or driving. Counseled patient to not consume with alcohol or illicit substances. Patient verbalized understanding.  - Counseled to not take with Hydroxyzine. Patient verbalized understanding.  - Counseled to not take with Tramadol prescribed per Orthopedics. Patient verbalized understanding.  - Follow-up with primary provider in 4 weeks or sooner if needed.  - traZODone (DESYREL) 50 MG tablet; Take 1 tablet (50 mg total) by mouth at bedtime.  Dispense: 30 tablet; Refill: 0   Follow Up Instructions: Follow-up with  primary provider in 4 weeks or sooner if needed.    Patient was given clear instructions to go to Emergency Department or return to medical center if symptoms don't improve, worsen, or new problems develop.The patient verbalized understanding.  I discussed the assessment and treatment plan with the patient. The patient was provided an opportunity to ask questions and all were answered. The patient agreed with the plan and demonstrated an  understanding of the instructions.   The patient was advised to call back or seek an in-person evaluation if the symptoms worsen or if the condition fails to improve as anticipated.    I provided 15 minutes total of non-face-to-face time during this encounter.   Rema Fendt, NP  [facility] [employer], [place] [phone] 08/21/2021, 10:52 AM

## 2021-08-18 ENCOUNTER — Other Ambulatory Visit: Payer: Self-pay | Admitting: Family

## 2021-08-18 ENCOUNTER — Encounter: Payer: Self-pay | Admitting: Family

## 2021-08-18 DIAGNOSIS — Z803 Family history of malignant neoplasm of breast: Secondary | ICD-10-CM

## 2021-08-18 DIAGNOSIS — N6320 Unspecified lump in the left breast, unspecified quadrant: Secondary | ICD-10-CM

## 2021-08-18 DIAGNOSIS — R87610 Atypical squamous cells of undetermined significance on cytologic smear of cervix (ASC-US): Secondary | ICD-10-CM

## 2021-08-18 LAB — CYTOLOGY - PAP
Comment: NEGATIVE
Comment: NEGATIVE
Diagnosis: UNDETERMINED — AB
HPV 16: NEGATIVE
HPV 18 / 45: NEGATIVE
High risk HPV: POSITIVE — AB

## 2021-08-18 NOTE — Progress Notes (Signed)
PAP smear abnormal and HPV positive. Referral to Gynecology for further evaluation and management. Their office should call patient within 2 weeks with appointment details.

## 2021-08-21 ENCOUNTER — Other Ambulatory Visit: Payer: Self-pay

## 2021-08-21 ENCOUNTER — Telehealth (INDEPENDENT_AMBULATORY_CARE_PROVIDER_SITE_OTHER): Payer: 59 | Admitting: Family

## 2021-08-21 ENCOUNTER — Telehealth: Payer: Self-pay | Admitting: Internal Medicine

## 2021-08-21 ENCOUNTER — Encounter: Payer: Self-pay | Admitting: Orthopedic Surgery

## 2021-08-21 ENCOUNTER — Ambulatory Visit (INDEPENDENT_AMBULATORY_CARE_PROVIDER_SITE_OTHER): Payer: 59 | Admitting: Orthopedic Surgery

## 2021-08-21 ENCOUNTER — Telehealth: Payer: Self-pay | Admitting: Family

## 2021-08-21 DIAGNOSIS — G4700 Insomnia, unspecified: Secondary | ICD-10-CM | POA: Diagnosis not present

## 2021-08-21 DIAGNOSIS — F411 Generalized anxiety disorder: Secondary | ICD-10-CM | POA: Diagnosis not present

## 2021-08-21 DIAGNOSIS — F32A Depression, unspecified: Secondary | ICD-10-CM

## 2021-08-21 DIAGNOSIS — S52551A Other extraarticular fracture of lower end of right radius, initial encounter for closed fracture: Secondary | ICD-10-CM

## 2021-08-21 MED ORDER — HYDROXYZINE PAMOATE 25 MG PO CAPS: 25.0000 mg | ORAL_CAPSULE | Freq: Three times a day (TID) | ORAL | 1 refills | Status: DC | PRN

## 2021-08-21 MED ORDER — TRAZODONE HCL 50 MG PO TABS: 50.0000 mg | ORAL_TABLET | Freq: Every day | ORAL | 0 refills | Status: DC

## 2021-08-21 NOTE — Progress Notes (Signed)
   Post-Op Visit Note   Patient: Tina Manning           Date of Birth: [birth year]           MRN: [id number] Visit Date: 08/21/2021 PCP: Rema Fendt, NP   Assessment & Plan:  Chief Complaint: No chief complaint on file.  Visit Diagnoses:  1. Other closed extra-articular fracture of distal end of right radius, initial encounter     Plan: Patient is a 30 year old female who presents s/p right distal radius ORIF on 05/15/2021.  She returns for 6-week appointment.  She is taking muscle laxer and gabapentin as well as occasional ibuprofen throughout the day for pain control.  Ice and heat make her pain worse.  She reports pain that ranges from a 1/10 to a 6/10 at worst.  She also complains of continued palmar numbness that has not really improved though her pain is steadily improving.  She is working at [employer].  Okay to start lifting more than 2 pounds as she can tolerate.  Incision looks to be healing well without any evidence of infection or dehiscence.  She has about 10 degrees less extension and supination compared with the contralateral side but pronation and flexion is equivalent.  Motor function of median, ulnar, radial nerves intact to the right hand.  2+ radial pulse of the right upper extremity.  Plan to return for final check in 3 months to recheck on her palmar numbness.  Anticipate this will improve but it can take quite a few months until this resolves.  Follow-Up Instructions: No follow-ups on file.   Orders:  No orders of the defined types were placed in this encounter.  No orders of the defined types were placed in this encounter.   Imaging: No results found.  PMFS History: Patient Active Problem List   Diagnosis Date Noted   Hyperlipidemia 08/15/2021   Prediabetes 08/15/2021   Morbid obesity due to excess calories (HCC) 08/15/2021   Closed fracture of lower end of right radius with routine healing    Anxiety and depression 08/09/2011    Endometriosis 03/27/2009   Tachycardia 11/27/1998   Asthma 04/24/1994   Past Medical History:  Diagnosis Date   Anemia    Asthma    Complication of anesthesia    Diabetes mellitus without complication (HCC)    Endometriosis    PONV (postoperative nausea and vomiting)    Tachycardia     No family history on file.  Past Surgical History:  Procedure Laterality Date   CHOLECYSTECTOMY     OPEN REDUCTION INTERNAL FIXATION (ORIF) DISTAL RADIAL FRACTURE Right 05/15/2021   Procedure: OPEN REDUCTION INTERNAL FIXATION (ORIF)RIGHT  DISTAL RADIAL FRACTURE;  Surgeon: Cammy Copa, MD;  Location: [facility] OR;  Service: Orthopedics;  Laterality: Right;   Social History   Occupational History   Not on file  Tobacco Use   Smoking status: Never   Smokeless tobacco: Never  Vaping Use   Vaping Use: Never used  Substance and Sexual Activity   Alcohol use: No   Drug use: No   Sexual activity: Not on file

## 2021-08-22 NOTE — Telephone Encounter (Signed)
Call made to patient, confirmed DOB. She reports she started using the Breztri last Tuesday and every since then she has had a severe headache intermittently. She wanted to know if this was a common side effect. OTC tylenol has not helped the pain. Reports the pain is mostly around her eyes, feels like her head is going to pop off. She reports the inhaler works great but she can't take the headache.   MW please advise. Thanks.

## 2021-08-22 NOTE — Telephone Encounter (Signed)
Will address on 08/25/21 when I return to [place].

## 2021-08-23 ENCOUNTER — Encounter: Payer: Self-pay | Admitting: Family

## 2021-08-25 NOTE — Telephone Encounter (Signed)
Rema Fendt, NP             Avonlea Sima C, LCSW       Pool for Responses:   Add a pool                                  [employer] as note              Send and Save Note           Follow-up         Done          Letter           Chart          Note         Encounter         Sign Encounter         Route          Other Actions       Message Message Demographics/Visits Meds/Problems My Last Note  Help       loading                                                                                                                                                Suggestion                             26         26                All        Filter                                                  Patient Calls  Tina Manning L55 y.o. F   Reason: Not documented            Patient Message  Tina Manning, [id number] y.o. F   therapy follow up  Hello again. So in trying to find out info on  my mom's medicaid, I found that she had medicaid direct....            Patient Message  Tina Manning, Tina Manning y.o. F   therapy follow up  Hello again. So in trying to find out info on my mom's medicaid, I found that she had medicaid direct....            Patient Message  Tina Manning, Tina Manning y.o. F   my mothers dementia  hello, so i feel my mothers  condition is moving in the wrong direction. i cant tell if truthful, or if...             Patient Calls  Tina Manning L57 y.o. F   Reason: Appointment              Patient Calls  Tina Manning W32 y.o. Manning   Reason: Not documented            Patient Calls  Tina Manning, James89 y.o. Manning   Reason: Not documented             Patient Calls  Tina Manning, James68 y.o. Manning   Reason: Not documented             Patient Calls  Tina Manning, Tina Manning y.o. Manning   Reason: Not documented             Patient Calls  Tina Manning y.o. F   Reason: Not documented            Patient Calls  Tina Manning y.o. F   Reason: Not documented           Patient Calls  Tina Manning, Tina Manning y.o. F   Reason: Appointment           Patient Calls  Tina Manning, Tina Manning y.o. F   Reason: Not documented             Patient Calls  Tina Manning33 y.o. F   Reason: Community Resources             Patient Calls  Tina Manning, Tina Manning y.o. Manning   Reason: Community Resources             Patient Calls  Tina Mins, [place] y.o. F   Reason: Not documented            Patient Calls  [name] [id number] y.o. F   Reason: Medication Problem             Patient Calls  [name] y.o. F   Reason: Appointment             Patient Calls  [name] L26 y.o. F   Reason: Advice Only             Patient Calls  [name] L26 y.o. F   Reason: Not documented            Patient Calls  [name], [name]9 y.o. F   Reason: Not documented            Patient Calls  [name], [name]78 y.o. F   Reason: Not documented             Patient Calls  [name], [name]34 y.o. F   Reason: Not documented             Patient  Calls  [name]27 y.o. F   Reason: Not documented           Patient Calls  [name], [name]30  y.o. F   Reason: Not documented                                                                          Patient Calls  [name]36 y.o. F   Reason: Advice Only                            All Done                                                                                                                      First attempt to reach pt, no answer, unable to leave vm as vm box was not set up.

## 2021-08-27 ENCOUNTER — Encounter: Payer: Self-pay | Admitting: Family

## 2021-08-29 NOTE — Telephone Encounter (Signed)
Disregard the previous message, not sure how this got there. Spoke with pt and scheduled appt for 09/12/21

## 2021-08-29 NOTE — Telephone Encounter (Signed)
[facility] appointment scheduled for 9/21

## 2021-08-30 NOTE — Telephone Encounter (Signed)
Form for medical records to be faxed to [place] from [facility] filled out and faxed to [phone]. Requested the records to be faxed to the [facility] office. Will hold form in Dr Thurston Hole cabinet in A pod.

## 2021-08-31 NOTE — Progress Notes (Signed)
Virtual Visit via Telephone Note  I connected with Tina Manning, on 09/01/2021 at 11:03 AM by telephone due to the COVID-19 pandemic and verified that I am speaking with the correct person using two identifiers.  Due to current restrictions/limitations of in-office visits due to the COVID-19 pandemic, this scheduled clinical appointment was converted to a telehealth visit.   Consent: I discussed the limitations, risks, security and privacy concerns of performing an evaluation and management service by telephone and the availability of in person appointments. I also discussed with the patient that there may be a patient responsible charge related to this service. The patient expressed understanding and agreed to proceed.   Location of Patient: Home  Location of Provider: [facility]   Persons participating in Telemedicine visit: Tina Manning Ricky Stabs, NP Margorie John, CMA   History of Present Illness: Tina Manning is a 30 year-old female who presents for anxiety and insomnia follow-up.  ANXIETY FOLLOW-UP: 08/21/2021: - Begin Hydroxyzine as prescribed.   09/01/2021: Reports feels Hydroxyzine is not helping as much as she would like. Reports recently had a panic attack.    2. INSOMNIA FOLLOW-UP: 08/21/2021: - Begin Trazodone as prescribed.   09/01/2021: Only getting a couple hours of sleep each night. Had nightmares when taking Trazodone.   Depression screen [facility] 2/9 09/01/2021 08/14/2021 06/23/2021  Decreased Interest 1 1 0  Down, Depressed, Hopeless 1 0 0  PHQ - 2 Score 2 1 0  Altered sleeping 3 3 0  Tired, decreased energy 1 3 0  Change in appetite 2 2 0  Feeling bad or failure about yourself  1 1 0  Trouble concentrating 1 1 0  Moving slowly or fidgety/restless 1 0 0  Suicidal thoughts 0 0 0  PHQ-9 Score 11 11 0  Difficult doing work/chores Somewhat difficult Somewhat difficult Not difficult at all    Past Medical  History:  Diagnosis Date   Anemia    Asthma    Complication of anesthesia    Diabetes mellitus without complication (HCC)    Endometriosis    PONV (postoperative nausea and vomiting)    Tachycardia    Allergies  Allergen Reactions   Bee Venom Anaphylaxis   Ketorolac Tromethamine Palpitations and Shortness Of Breath   Penicillins Other (See Comments)    Intolerance     Current Outpatient Medications on File Prior to Visit  Medication Sig Dispense Refill   albuterol (VENTOLIN HFA) 108 (90 Base) MCG/ACT inhaler Inhale 1-2 puffs into the lungs every 6 (six) hours as needed for wheezing or shortness of breath. 6.7 g 1   atorvastatin (LIPITOR) 40 MG tablet Take 1 tablet (40 mg total) by mouth daily. 90 tablet 0   Budeson-Glycopyrrol-Formoterol (BREZTRI AEROSPHERE) 160-9-4.8 MCG/ACT AERO Inhale 2 puffs into the lungs in the morning and at bedtime. 5.9 g 0   gabapentin (NEURONTIN) 300 MG capsule Take 1 capsule (300 mg total) by mouth 3 (three) times daily. 30 capsule 0   hydrOXYzine (VISTARIL) 25 MG capsule Take 1 capsule (25 mg total) by mouth every 8 (eight) hours as needed. 30 capsule 1   ipratropium-albuterol (DUONEB) 0.5-2.5 (3) MG/3ML SOLN Take 3 mLs by nebulization in the morning, at noon, in the evening, and at bedtime.     methocarbamol (ROBAXIN) 500 MG tablet Take 1 tablet (500 mg total) by mouth every 8 (eight) hours as needed for muscle spasms. 30 tablet 0   metoprolol succinate (TOPROL-XL) 25 MG  24 hr tablet Take 1 tablet (25 mg total) by mouth daily. 90 tablet 0   mometasone-formoterol (DULERA) 200-5 MCG/ACT AERO Take 2 puffs first thing in am and then another 2 puffs about 12 hours later. 1 each 11   traMADol (ULTRAM) 50 MG tablet 1 po q hs prn pain 30 tablet 0   traZODone (DESYREL) 50 MG tablet Take 1 tablet (50 mg total) by mouth at bedtime. 30 tablet 0   No current facility-administered medications on file prior to visit.    Observations/Objective: Alert and oriented x  3. Not in acute distress. Physical examination not completed as this is a telemedicine visit.  Assessment and Plan: 1. Generalized anxiety disorder: - Patient denies thoughts of self-harm, suicidal ideations, and homicidal ideations.  - Increase Hydroxyzine from 25 mg to 50 mg every 8 hours as needed.  - Begin Sertraline as prescribed.  - Referral to Psychiatry for further evaluation and management.  - Follow-up with primary provider in 4 weeks or sooner if needed.  - hydrOXYzine (VISTARIL) 50 MG capsule; Take 1 capsule (50 mg total) by mouth every 8 (eight) hours as needed.  Dispense: 30 capsule; Refill: 0 - sertraline (ZOLOFT) 25 MG tablet; Take 1 tablet (25 mg total) by mouth daily.  Dispense: 30 tablet; Refill: 0 - Ambulatory referral to Psychiatry  2. Insomnia, unspecified type: - Trazodone discontinued related to side effects of nightmares.  - Referral to Sleep Studies for further evaluation and management.  - Follow-up with primary provider as scheduled.  - Ambulatory referral to Sleep Studies   Follow Up Instructions: Follow-up with primary provider in 4 weeks or sooner if needed. Referral to Psychiatry and Sleep Studies.   Patient was given clear instructions to go to Emergency Department or return to medical center if symptoms don't improve, worsen, or new problems develop.The patient verbalized understanding.  I discussed the assessment and treatment plan with the patient. The patient was provided an opportunity to ask questions and all were answered. The patient agreed with the plan and demonstrated an understanding of the instructions.   The patient was advised to call back or seek an in-person evaluation if the symptoms worsen or if the condition fails to improve as anticipated.    I provided 15 minutes total of non-face-to-face time during this encounter.   Rema Fendt, NP  [facility] [place], [place] [phone] 09/01/2021, 11:03  AM

## 2021-09-01 ENCOUNTER — Ambulatory Visit (INDEPENDENT_AMBULATORY_CARE_PROVIDER_SITE_OTHER): Payer: 59 | Admitting: Family

## 2021-09-01 DIAGNOSIS — F411 Generalized anxiety disorder: Secondary | ICD-10-CM

## 2021-09-01 DIAGNOSIS — G4700 Insomnia, unspecified: Secondary | ICD-10-CM

## 2021-09-01 MED ORDER — SERTRALINE HCL 25 MG PO TABS: 25.0000 mg | ORAL_TABLET | Freq: Every day | ORAL | 0 refills | Status: DC

## 2021-09-01 MED ORDER — HYDROXYZINE PAMOATE 50 MG PO CAPS: 50.0000 mg | ORAL_CAPSULE | Freq: Three times a day (TID) | ORAL | 0 refills | Status: DC | PRN

## 2021-09-01 NOTE — Progress Notes (Signed)
Pt presents for telemedicine visit for medication problem states trazodone not working

## 2021-09-06 ENCOUNTER — Encounter: Payer: Self-pay | Admitting: Family

## 2021-09-10 ENCOUNTER — Other Ambulatory Visit: Payer: Self-pay | Admitting: Orthopedic Surgery

## 2021-09-10 DIAGNOSIS — S52551A Other extraarticular fracture of lower end of right radius, initial encounter for closed fracture: Secondary | ICD-10-CM

## 2021-09-10 DIAGNOSIS — M25531 Pain in right wrist: Secondary | ICD-10-CM

## 2021-09-11 ENCOUNTER — Telehealth: Payer: Self-pay | Admitting: Internal Medicine

## 2021-09-11 DIAGNOSIS — J4540 Moderate persistent asthma, uncomplicated: Secondary | ICD-10-CM

## 2021-09-11 MED ORDER — ALBUTEROL SULFATE HFA 108 (90 BASE) MCG/ACT IN AERS: 1.0000 | INHALATION_SPRAY | Freq: Four times a day (QID) | RESPIRATORY_TRACT | 2 refills | Status: DC | PRN

## 2021-09-11 MED ORDER — GABAPENTIN 300 MG PO CAPS: 300.0000 mg | ORAL_CAPSULE | Freq: Three times a day (TID) | ORAL | 0 refills | Status: DC

## 2021-09-11 NOTE — Telephone Encounter (Signed)
Spoke with pt  She is requesting rf on albuterol inhaler  Rx was sent  Pt reports still having cough- advised keep appt tomorrow for eval Nothing further needed

## 2021-09-12 ENCOUNTER — Institutional Professional Consult (permissible substitution): Payer: 59 | Admitting: Clinical

## 2021-09-12 ENCOUNTER — Ambulatory Visit (INDEPENDENT_AMBULATORY_CARE_PROVIDER_SITE_OTHER): Payer: 59

## 2021-09-12 ENCOUNTER — Ambulatory Visit (INDEPENDENT_AMBULATORY_CARE_PROVIDER_SITE_OTHER): Payer: 59 | Admitting: Primary Care

## 2021-09-12 ENCOUNTER — Encounter: Payer: Self-pay | Admitting: Primary Care

## 2021-09-12 ENCOUNTER — Other Ambulatory Visit: Payer: Self-pay

## 2021-09-12 VITALS — BP 126/80 | HR 110 | Temp 98.1°F | Ht 65.0 in | Wt 308.0 lb

## 2021-09-12 DIAGNOSIS — J4540 Moderate persistent asthma, uncomplicated: Secondary | ICD-10-CM

## 2021-09-12 MED ORDER — DOXYCYCLINE HYCLATE 100 MG PO TABS: 100.0000 mg | ORAL_TABLET | Freq: Two times a day (BID) | ORAL | 0 refills | Status: DC

## 2021-09-12 MED ORDER — METHYLPREDNISOLONE ACETATE 80 MG/ML IJ SUSP
120.0000 mg | Freq: Once | INTRAMUSCULAR | Status: AC
  Administered 2021-09-12: 120 mg via INTRAMUSCULAR

## 2021-09-12 MED ORDER — PREDNISONE 10 MG PO TABS: ORAL_TABLET | ORAL | 0 refills | Status: DC

## 2021-09-12 MED ORDER — MONTELUKAST SODIUM 10 MG PO TABS: 10.0000 mg | ORAL_TABLET | Freq: Every day | ORAL | 11 refills | Status: AC

## 2021-09-12 MED ORDER — PANTOPRAZOLE SODIUM 40 MG PO TBEC: 40.0000 mg | DELAYED_RELEASE_TABLET | Freq: Every day | ORAL | 2 refills | Status: DC

## 2021-09-12 NOTE — Patient Instructions (Addendum)
Treating you for asthmatic bronchitis with antibiotics and prednisone taper.   Recommendations: - Continue Dulera - take two puffs morning and evening (rinse mouth after use) - Use Duoneb four times a day in place of rescue inhaler until better  - Start Singulair 10mg  at bedtime (maintenance medication for asthma/allergies) - Start Protonix 40mg  daily for GERD symptoms (you can get this over the counter, I did send in a prescription)  Office treatment: - Depomedrol 120mg  IM x1 today  Orders: - Labs today (CBC with Diff and IgE)  Rx: - Prednisone taper as directed (start tomorrow) - Doxycycline 1 tab twice daily x 7 days  - Singulair 10mg  at bedtime  - Protonix 40mg  daily    Follow-up: 1-2 months with Dr. 

## 2021-09-12 NOTE — Progress Notes (Signed)
@Patient  ID: , female    DOB: 1991-12-10, 30 y.o.   MRN: [id number]  Chief Complaint  Patient presents with   Follow-up    Patient reports shortness of breath x 3 days with yellow sputum. Chest feels tight and hurts to breath per patient.     Referring provider: [id number], NP  Brief patient profile:  30 yowf never smoker with childhood asthma typically needing saba only with PE while living in [place] where became more of a chronic issue around 2020 but still only on saba as "maint" and required  intubation x 10 days  Thanksgiving 2021 at [facility] in [place] referred to pulmonary clinic 08/15/2021 by 08/17/2021, NP for asthma.  Prior to the chronic asthma problem in 2020 was maintaining  wt  around 80   HPI: 30 year old female, never smoked. PMH significant for moderate persistent asthma. Patient of Dr. 26, last seen for initial consult on 08/15/21.   Previous [facility] pulmonary encounter: 08/15/2021  Pulmonary/ 1st office eval/Wert no maint rx  since Feb 2022 arrived in [place] Chief Complaint  Patient presents with   Consult    Patient reports increased shortness of breath in last 2 months at rest and with exertion.   Dyspnea:  can finish [employer] grocery store s stopping  Cough: none Sleep: 45 degrees x sev years SABA use: saba hfa avg 4-5 x per day/finished pred x 5 days prior to OV  - last dose 3.5 h prior to OV    No obvious day to day or daytime variability or assoc excess/ purulent sputum or mucus plugs or hemoptysis or cp or chest tightness, subjective wheeze or overt sinus or hb symptoms.   Sleeping  without nocturnal  or early am exacerbation  of respiratory  c/o's or need for noct saba. Also denies any obvious fluctuation of symptoms with weather or environmental changes or other aggravating or alleviating factors except as outlined above   No unusual exposure hx or h/o childhood pna/ asthma or knowledge of premature birth.   09/12/2021- interim  hx  Patient presents today for 4 week follow-up/chest tightness. She is maintained on Dulera (or 09/14/2021), pantoprazole 40mg  qd and famotidine 20mg  at bedtime.  Patient reports increased shortness of breath for the last 4-5 days with associated chest tightness/wheezing and cough. Her cough is worse at night and can be productive with yellow mucus. She is not taking anything over the counter for her cough. She is consistently taking Dulera 2 puffs morning and evening. She has been needing to use her albuterol rescue inhaler 5-7 times a day with temporarily improvement. She uses duoneb three times a day. She is a non smoker, however, her husband does smoke around her.   Dyspnea: winded walking 50-118ft  Cough: productive cough at night  Sleep: 45 degrees SABA: 5-7 times a day    Allergies  Allergen Reactions   Bee Venom Anaphylaxis   Ketorolac Tromethamine Palpitations and Shortness Of Breath   Penicillins Other (See Comments)    Intolerance     Immunization History  Administered Date(s) Administered   PFIZER(Purple Top)SARS-COV-2 Vaccination 01/27/2020, 02/25/2020    Past Medical History:  Diagnosis Date   Anemia    Asthma    Complication of anesthesia    Diabetes mellitus without complication (HCC)    Endometriosis    PONV (postoperative nausea and vomiting)    Tachycardia     Tobacco History: Social History   Tobacco Use  Smoking Status Never  Smokeless Tobacco Never   Counseling given: Not Answered   Outpatient Medications Prior to Visit  Medication Sig Dispense Refill   albuterol (VENTOLIN HFA) 108 (90 Base) MCG/ACT inhaler Inhale 1-2 puffs into the lungs every 6 (six) hours as needed for wheezing or shortness of breath. 8 g 2   atorvastatin (LIPITOR) 40 MG tablet Take 1 tablet (40 mg total) by mouth daily. 90 tablet 0   gabapentin (NEURONTIN) 300 MG capsule Take 1 capsule (300 mg total) by mouth 3 (three) times daily. 30 capsule 0   hydrOXYzine  (VISTARIL) 50 MG capsule Take 1 capsule (50 mg total) by mouth every 8 (eight) hours as needed. 30 capsule 0   ipratropium-albuterol (DUONEB) 0.5-2.5 (3) MG/3ML SOLN Take 3 mLs by nebulization in the morning, at noon, in the evening, and at bedtime.     metoprolol succinate (TOPROL-XL) 25 MG 24 hr tablet Take 1 tablet (25 mg total) by mouth daily. 90 tablet 0   mometasone-formoterol (DULERA) 200-5 MCG/ACT AERO Take 2 puffs first thing in am and then another 2 puffs about 12 hours later. 1 each 11   sertraline (ZOLOFT) 25 MG tablet Take 1 tablet (25 mg total) by mouth daily. 30 tablet 0   Budeson-Glycopyrrol-Formoterol (BREZTRI AEROSPHERE) 160-9-4.8 MCG/ACT AERO Inhale 2 puffs into the lungs in the morning and at bedtime. 5.9 g 0   methocarbamol (ROBAXIN) 500 MG tablet Take 1 tablet (500 mg total) by mouth every 8 (eight) hours as needed for muscle spasms. 30 tablet 0   traMADol (ULTRAM) 50 MG tablet 1 po q hs prn pain 30 tablet 0   No facility-administered medications prior to visit.      Review of Systems  Review of Systems  Constitutional: Negative.   HENT:  Positive for congestion.   Respiratory:  Positive for cough, chest tightness, shortness of breath and wheezing.   Cardiovascular: Negative.     Physical Exam  BP 126/80 (BP Location: Left Arm, Patient Position: Sitting, Cuff Size: Large)   Pulse (!) 110   Temp 98.1 F (36.7 C) (Oral)   Ht 5\' 5"  (1.651 m)   Wt (!) 308 lb (139.7 kg)   SpO2 97%   BMI 51.25 kg/m  Physical Exam Constitutional:      General: She is not in acute distress.    Appearance: Normal appearance. She is obese. She is not ill-appearing.  HENT:     Mouth/Throat:     Mouth: Mucous membranes are moist.     Pharynx: Oropharynx is clear.  Cardiovascular:     Rate and Rhythm: Normal rate and regular rhythm.  Pulmonary:     Effort: Pulmonary effort is normal.     Breath sounds: Wheezing and rhonchi present. No rales.  Musculoskeletal:        General:  Normal range of motion.  Skin:    General: Skin is warm and dry.  Neurological:     General: No focal deficit present.     Mental Status: She is alert and oriented to person, place, and time. Mental status is at baseline.  Psychiatric:        Mood and Affect: Mood normal.        Behavior: Behavior normal.        Thought Content: Thought content normal.        Judgment: Judgment normal.     Lab Results:  CBC    Component Value Date/Time   WBC 10.9 (H) 08/14/2021 1724  WBC 16.1 (H) 05/15/2021 1410   RBC 5.17 08/14/2021 1724   RBC 5.13 (H) 05/15/2021 1410   HGB 13.3 08/14/2021 1724   HCT 41.5 08/14/2021 1724   PLT 438 08/14/2021 1724   MCV 80 08/14/2021 1724   MCH 25.7 (L) 08/14/2021 1724   MCH 25.5 (L) 05/15/2021 1410   MCHC 32.0 08/14/2021 1724   MCHC 31.3 05/15/2021 1410   RDW 14.7 08/14/2021 1724   LYMPHSABS 2.5 02/27/2018 0716   MONOABS 0.6 02/27/2018 0716   EOSABS 0.4 02/27/2018 0716   BASOSABS 0.0 02/27/2018 0716    BMET    Component Value Date/Time   NA 136 05/15/2021 1410   K 4.0 05/15/2021 1410   CL 103 05/15/2021 1410   CO2 22 05/15/2021 1410   GLUCOSE 81 05/15/2021 1410   BUN 11 05/15/2021 1410   CREATININE 0.73 05/15/2021 1410   CALCIUM 9.0 05/15/2021 1410   GFRNONAA >60 05/15/2021 1410   GFRAA >60 02/27/2018 0716    BNP No results found for: BNP  ProBNP No results found for: PROBNP  Imaging: DG Chest 2 View  Result Date: 09/12/2021 CLINICAL DATA:  Asthmatic bronchitis.  Moderate persistent asthma. EXAM: CHEST - 2 VIEW COMPARISON:  Radiograph yesterday. FINDINGS: No significant change in peribronchial thickening from prior. No evidence of pneumomediastinum or pneumothorax. Minimal subsegmental atelectasis anteriorly likely in the right middle lobe, no confluent airspace or pneumonia. Heart is normal in size. Normal mediastinal contours. No pleural effusion. No acute osseous abnormalities are seen. IMPRESSION: Unchanged peribronchial thickening  from radiograph yesterday. No new findings. Electronically Signed   By: Narda Rutherford M.D.   On: 09/12/2021 17:22   DG Chest 2 View  Result Date: 08/15/2021 CLINICAL DATA:  30 year old female with chest tightness and cough EXAM: CHEST - 2 VIEW COMPARISON:  05/19/2021 FINDINGS: Cardiomediastinal silhouette unchanged in size and contour. No evidence of central vascular congestion. No interlobular septal thickening. Low lung volumes No pneumothorax or pleural effusion. Coarsened interstitial markings, with no confluent airspace disease. No acute displaced fracture. Degenerative changes of the spine. IMPRESSION: Low lung volumes without evidence of acute cardiopulmonary disease Electronically Signed   By: Gilmer Mor D.O.   On: 08/15/2021 13:46     Assessment & Plan:   Asthma - Treating patient today for acute asthma exacerbation with bronchitis features. She had scattered rhonchi and wheezing on exam. She received DepoMedrol 120mg  IM x1 today in office. Sending in prednisone taper and course of Doxycyline 100mg  BID x 7 days. I also recommend she start taking Singulair 10mg  at bedtime and Protonix for underlying GERD which could be contributing to her respiratory symptoms. Continue Dulera two puffs twice daily with spacer and advised she use ipratropium nebulizer 4 times daily until better. Also advised she start taking mucinex 600-1200mg  twice daily for congestion. Checking CXR, CBC with Diff and IgE- unable to get FENO today.    , NP 09/12/2021

## 2021-09-12 NOTE — Assessment & Plan Note (Addendum)
-   Treating patient today for acute asthma exacerbation with bronchitis features. She had scattered rhonchi and wheezing on exam. She received DepoMedrol 120mg  IM x1 today in office. Sending in prednisone taper and course of Doxycyline 100mg  BID x 7 days. I also recommend she start taking Singulair 10mg  at bedtime and Protonix for underlying GERD which could be contributing to her respiratory symptoms. Continue Dulera two puffs twice daily with spacer and advised she use ipratropium nebulizer 4 times daily until better. Also advised she start taking mucinex 600-1200mg  twice daily for congestion. Checking CXR, CBC with Diff and IgE- unable to get FENO today.

## 2021-09-13 ENCOUNTER — Telehealth: Payer: Self-pay | Admitting: Orthopedic Surgery

## 2021-09-13 ENCOUNTER — Encounter: Payer: Self-pay | Admitting: Family Medicine

## 2021-09-13 ENCOUNTER — Other Ambulatory Visit (HOSPITAL_COMMUNITY)
Admission: RE | Admit: 2021-09-13 | Discharge: 2021-09-13 | Disposition: A | Discharge status: Home / Self Care | Payer: 59 | Source: Ambulatory Visit | Attending: Family Medicine | Admitting: Family Medicine

## 2021-09-13 ENCOUNTER — Encounter: Payer: Self-pay | Admitting: General Practice

## 2021-09-13 ENCOUNTER — Ambulatory Visit (INDEPENDENT_AMBULATORY_CARE_PROVIDER_SITE_OTHER): Payer: 59 | Admitting: Family Medicine

## 2021-09-13 ENCOUNTER — Encounter: Payer: Self-pay | Admitting: Family

## 2021-09-13 VITALS — BP 122/78 | HR 101 | Ht 65.0 in | Wt 306.0 lb

## 2021-09-13 DIAGNOSIS — N94819 Vulvodynia, unspecified: Secondary | ICD-10-CM | POA: Insufficient documentation

## 2021-09-13 DIAGNOSIS — R87610 Atypical squamous cells of undetermined significance on cytologic smear of cervix (ASC-US): Secondary | ICD-10-CM | POA: Insufficient documentation

## 2021-09-13 DIAGNOSIS — R87810 Cervical high risk human papillomavirus (HPV) DNA test positive: Secondary | ICD-10-CM

## 2021-09-13 LAB — CBC WITH DIFFERENTIAL/PLATELET
Basophils Absolute: 0 10*3/uL (ref 0.0–0.1)
Basophils Relative: 0.2 % (ref 0.0–3.0)
Eosinophils Absolute: 0.2 10*3/uL (ref 0.0–0.7)
Eosinophils Relative: 1.4 % (ref 0.0–5.0)
HCT: 39.3 % (ref 36.0–46.0)
Hemoglobin: 12.4 g/dL (ref 12.0–15.0)
Lymphocytes Relative: 15.5 % (ref 12.0–46.0)
Lymphs Abs: 2.3 10*3/uL (ref 0.7–4.0)
MCHC: 31.6 g/dL (ref 30.0–36.0)
MCV: 81.4 fl (ref 78.0–100.0)
Monocytes Absolute: 1 10*3/uL (ref 0.1–1.0)
Monocytes Relative: 7.1 % (ref 3.0–12.0)
Neutro Abs: 11 10*3/uL — ABNORMAL HIGH (ref 1.4–7.7)
Neutrophils Relative %: 75.8 % (ref 43.0–77.0)
Platelets: 430 10*3/uL — ABNORMAL HIGH (ref 150.0–400.0)
RBC: 4.82 Mil/uL (ref 3.87–5.11)
RDW: 15.7 % — ABNORMAL HIGH (ref 11.5–15.5)
WBC: 14.6 10*3/uL — ABNORMAL HIGH (ref 4.0–10.5)

## 2021-09-13 LAB — POCT URINE PREGNANCY: Preg Test, Ur: NEGATIVE

## 2021-09-13 LAB — IGE: IgE (Immunoglobulin E), Serum: 482 kU/L — ABNORMAL HIGH (ref <=114)

## 2021-09-13 NOTE — Telephone Encounter (Signed)
Received vm from paralegal at [facility] office checking if we received request for records. She stated she faxed to [phone] on 9/15. IC [phone], lmvm advised that is not our fax number and do not know to where that fax numbe is. I left both our fax numbers for her to send to our office.

## 2021-09-13 NOTE — Telephone Encounter (Signed)
MW please advise. Thanks  Is there anything that is concerning with my blood test?

## 2021-09-13 NOTE — Progress Notes (Signed)
    GYNECOLOGY CLINIC COLPOSCOPY PROCEDURE NOTE  30 y.o. No obstetric history on file. 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  Pregnancy test: Negative per support staff  Allergies  Allergen Reactions   Bee Venom Anaphylaxis   Ketorolac Tromethamine Palpitations and Shortness Of Breath   Penicillins Other (See Comments)    Intolerance     Patient given informed consent, signed copy in the chart, time out was performed.    Placed in lithotomy position. Cervix viewed with speculum and colposcope after application of acetic acid.   Colposcopy Adequacy Cervix fully visualized: Yes  SCJ fully visualized: Yes  Colposcopy Findings no mosaicism, no punctation, no abnormal vasculature, and faint acetowhite lesion(s) noted at 6 o'clock  Corresponding biopsies were obtained.    ECC specimen was not obtained.  All specimens were labeled and sent to pathology.  Hemostatic measures: Pressure and Monsel's solution  Complications: none  Patient tolerated the procedure poorly, significant vulvodynia during exam.  Physical Exam Genitourinary:     Vulva exam comments: Many varicose veins.     Vaginal erythema present.     No vaginal discharge.     Cervix is nulliparous.     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       Colposcopy Impressions Low grade features  Plan Treatment plan pending biopsy results, per patient preference they will be communicated by MyChart.  Patient was given post procedure instructions.  Will follow up pathology and manage accordingly; patient will be contacted with results and recommendations.  Routine preventative health maintenance measures emphasized.  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 Patient also reports long hx of irregular menses, instructed to schedule follow up visit at checkout to address these other issues.   Venora Maples, MD/MPH Attending Family Medicine Physician, [facility] for [facility], [facility]

## 2021-09-14 LAB — CERVICOVAGINAL ANCILLARY ONLY
Bacterial Vaginitis (gardnerella): NEGATIVE
Candida Glabrata: NEGATIVE
Candida Vaginitis: NEGATIVE
Chlamydia: NEGATIVE
Comment: NEGATIVE
Comment: NEGATIVE
Comment: NEGATIVE
Comment: NEGATIVE
Comment: NEGATIVE
Comment: NORMAL
Neisseria Gonorrhea: NEGATIVE
Trichomonas: NEGATIVE

## 2021-09-14 MED ORDER — PROMETHAZINE-DM 6.25-15 MG/5ML PO SYRP: 5.0000 mL | ORAL_SOLUTION | Freq: Four times a day (QID) | ORAL | 0 refills | Status: DC | PRN

## 2021-09-14 NOTE — Telephone Encounter (Signed)
Called and spoke with Patient.  Patient stated she was seen by Waynetta Sandy, NP this week and is taking Doxycycline and Prednisone.  Patient stated she has a productive cough, with yellow sputum. Patient stated she has tried Mucinex and Robitussin with no relief.  Patient requested cough medication to [facility].   LOV 9/20/22Waynetta Sandy, NP  Recommendations: - Continue Dulera - take two puffs morning and evening (rinse mouth after use) - Use Duoneb four times a day in place of rescue inhaler until better  - Start Singulair 10mg  at bedtime (maintenance medication for asthma/allergies) - Start Protonix 40mg  daily for GERD symptoms (you can get this over the counter, I did send in a prescription)   Office treatment: - Depomedrol 120mg  IM x1 today   Orders: - Labs today (CBC with Diff and IgE)   Rx: - Prednisone taper as directed (start tomorrow) - Doxycycline 1 tab twice daily x 7 days  - Singulair 10mg  at bedtime  - Protonix 40mg  daily      Follow-up: 1-2 months with Dr. 

## 2021-09-14 NOTE — Telephone Encounter (Signed)
Can do  phenergan dm 1 -2 tsp qid this is strongest non narcotic available  #180 cc and see me if not better

## 2021-09-14 NOTE — Telephone Encounter (Signed)
Called and spoke with Patient.  Dr. Thurston Hole recommendations given.  Understanding stated.  Prescription sent to requested pharmacy.  Nothing further at this time.

## 2021-09-14 NOTE — Progress Notes (Signed)
Please let patient know her allergy markers were elevated along with WBC. No change to plan treating for asthma exacerbation and bronchitis. Labs will help manage treatment plan in the future if changes are needed

## 2021-09-14 NOTE — Telephone Encounter (Signed)
Pt called back regarding lab results. States she will view them on mychart.  Asked for something to be called in for cough. Would like response via mychart.

## 2021-09-15 LAB — SURGICAL PATHOLOGY

## 2021-09-24 ENCOUNTER — Encounter: Payer: Self-pay | Admitting: Family

## 2021-09-25 ENCOUNTER — Other Ambulatory Visit: Payer: Self-pay | Admitting: Family

## 2021-09-25 ENCOUNTER — Ambulatory Visit: Payer: 59 | Admitting: Internal Medicine

## 2021-09-25 DIAGNOSIS — J4540 Moderate persistent asthma, uncomplicated: Secondary | ICD-10-CM

## 2021-09-25 DIAGNOSIS — F411 Generalized anxiety disorder: Secondary | ICD-10-CM

## 2021-09-25 MED ORDER — ALBUTEROL SULFATE HFA 108 (90 BASE) MCG/ACT IN AERS: 1.0000 | INHALATION_SPRAY | Freq: Four times a day (QID) | RESPIRATORY_TRACT | 2 refills | Status: DC | PRN

## 2021-09-25 NOTE — Progress Notes (Deleted)
Tina Manning, female    DOB: 1991-03-27,    MRN: [id number]   Brief patient profile:  30 yowf never smoker with childhood asthma typically needing saba only with PE while living in [place] where became more of a chronic issue around 2020 but still only on saba as "maint" and required  intubation x 10 days  Thanksgiving 2021 at [facility] in [place] referred to pulmonary clinic 08/15/2021 by Ricky Stabs, NP for asthma.  Prior to the chronic asthma problem in 2020 was maintaining  wt  around 230    History of Present Illness  08/15/2021  Pulmonary/ 1st office eval/[facility] Hemp no maint rx  since Feb 2022 arrived in [place] Chief Complaint  Patient presents with   Consult    Patient reports increased shortness of breath in last 2 months at rest and with exertion.   Dyspnea:  can finish [employer] store s stopping  Cough: none Sleep: 45 degrees x sev years SABA use: saba hfa avg 4-5 x per day/finished pred x 5 days prior to OV  - last dose 3.5 h prior to OV   Rec Plan A = Automatic = Always=  Dulera 200 (or Breztri) Take 2 puffs first thing in am and then another 2 puffs about 12 hours later.  And take Pantoprazole (protonix) 40 mg   Take  30-60 min before first meal of the day and Pepcid (famotidine)  20 mg after supper    Work on inhaler technique:    Plan B = Backup (to supplement plan A, not to replace it) Only use your albuterol inhaler as a rescue medication Plan C = Crisis (instead of Plan B but only if Plan B stops working) - only use your albuterol nebulizer if you first try Plan B and it fails to help > ok to use the nebulizer up to every 4 hours but if start needing it regularly call for immediate appointment Also ok to Try albuterol 15 min before an activity (on alternating days)  that you know would make you short of breath and see if it makes any difference and if makes none then don't take albuterol after activity unless you can't catch your breath as this means it's the  resting that helps, not the albuterol. Depomedrol 120 mg IM  GERD diet reviewed, bed blocks rec   Singulair added by NP  09/12/21 with Allergy profile    Eos 0.2/  IgE  482    09/25/2021  f/u ov/Paytin Ramakrishnan re: ***   maint on ***  No chief complaint on file.   Dyspnea:  *** Cough: *** Sleeping: *** SABA use: *** 02: *** Covid status:   ***   No obvious day to day or daytime variability or assoc excess/ purulent sputum or mucus plugs or hemoptysis or cp or chest tightness, subjective wheeze or overt sinus or hb symptoms.   *** without nocturnal  or early am exacerbation  of respiratory  c/o's or need for noct saba. Also denies any obvious fluctuation of symptoms with weather or environmental changes or other aggravating or alleviating factors except as outlined above   No unusual exposure hx or h/o childhood pna/ asthma or knowledge of premature birth.  Current Allergies, Complete Past Medical History, Past Surgical History, Family History, and Social History were reviewed in [employer] record.  ROS  The following are not active complaints unless bolded Hoarseness, sore throat, dysphagia, dental problems, itching, sneezing,  nasal congestion or discharge of  excess mucus or purulent secretions, ear ache,   fever, chills, sweats, unintended wt loss or wt gain, classically pleuritic or exertional cp,  orthopnea pnd or arm/hand swelling  or leg swelling, presyncope, palpitations, abdominal pain, anorexia, nausea, vomiting, diarrhea  or change in bowel habits or change in bladder habits, change in stools or change in urine, dysuria, hematuria,  rash, arthralgias, visual complaints, headache, numbness, weakness or ataxia or problems with walking or coordination,  change in mood or  memory.        No outpatient medications have been marked as taking for the 09/25/21 encounter (Appointment) with Nyoka Cowden, MD.         Past Medical History:  Diagnosis Date   Anemia     Asthma    Complication of anesthesia    Diabetes mellitus without complication (HCC)    Endometriosis    PONV (postoperative nausea and vomiting)    Tachycardia        Objective:       Wt Readings from Last 3 Encounters:  09/13/21 (!) 306 lb (138.8 kg)  09/12/21 (!) 308 lb (139.7 kg)  08/15/21 (!) 311 lb (141.1 kg)      Vital signs reviewed  09/25/2021  - Note at rest 02 sats  ***% on ***   General appearance:    ***           Assessment

## 2021-09-26 ENCOUNTER — Other Ambulatory Visit: Payer: Self-pay | Admitting: *Deleted

## 2021-09-28 ENCOUNTER — Other Ambulatory Visit: Payer: Self-pay | Admitting: Family

## 2021-09-28 DIAGNOSIS — F411 Generalized anxiety disorder: Secondary | ICD-10-CM

## 2021-09-29 MED ORDER — HYDROXYZINE PAMOATE 50 MG PO CAPS: 50.0000 mg | ORAL_CAPSULE | Freq: Three times a day (TID) | ORAL | 0 refills | Status: DC | PRN

## 2021-09-29 MED ORDER — SERTRALINE HCL 25 MG PO TABS: 25.0000 mg | ORAL_TABLET | Freq: Every day | ORAL | 0 refills | Status: DC

## 2021-09-29 NOTE — Telephone Encounter (Signed)
Hydroxyzine (Vistaril) and Sertraline (Zoloft) refilled per patient request for 30 day courtesy refill. Please schedule appointment for additional refills.

## 2021-09-29 NOTE — Telephone Encounter (Signed)
Routed to PCP to refill if appropriate.  ?

## 2021-10-09 ENCOUNTER — Ambulatory Visit: Payer: 59 | Admitting: Obstetrics & Gynecology

## 2021-10-09 ENCOUNTER — Ambulatory Visit: Payer: 59 | Admitting: Internal Medicine

## 2021-10-09 NOTE — Progress Notes (Deleted)
Tina Manning, female    DOB: March 24, 1991,    MRN: [id number]   Brief patient profile:  30 yowf never smoker with childhood asthma typically needing saba only with PE while living in [place] where became more of a chronic issue around 2020 but still only on saba as "maint" and required  intubation x 10 days  Thanksgiving 2021 at [facility] in [place] referred to pulmonary clinic 08/15/2021 by Tina Stabs, NP for asthma.  Prior to the chronic asthma problem in 2020 was maintaining  wt  around 230    History of Present Illness  08/15/2021  Pulmonary/ 1st office eval/[facility] no maint rx  since Feb 2022 arrived in [place] Chief Complaint  Patient presents with   Consult    Patient reports increased shortness of breath in last 2 months at rest and with exertion.   Dyspnea:  can finish [employer] grocery store s stopping  Cough: none Sleep: 45 degrees x sev years SABA use: saba hfa avg 4-5 x per day/finished pred x 5 days prior to OV  - last dose 3.5 h prior to OV   Rec Plan A = Automatic = Always=  Dulera 200 (or Breztri) Take 2 puffs first thing in am and then another 2 puffs about 12 hours later.  And take Pantoprazole (protonix) 40 mg   Take  30-60 min before first meal of the day and Pepcid (famotidine)  20 mg after supper    Work on inhaler technique:    Plan B = Backup (to supplement plan A, not to replace it) Only use your albuterol inhaler as a rescue medication Plan C = Crisis (instead of Plan B but only if Plan B stops working) - only use your albuterol nebulizer if you first try Plan B and it fails to help > ok to use the nebulizer up to every 4 hours but if start needing it regularly call for immediate appointment Also ok to Try albuterol 15 min before an activity (on alternating days)  that you know would make you short of breath and see if it makes any difference and if makes none then don't take albuterol after activity unless you can't catch your breath as this means it's the  resting that helps, not the albuterol. Depomedrol 120 mg IM  GERD diet reviewed, bed blocks rec   Singulair added by NP  09/12/21 with Allergy profile    Eos 0.2/  IgE  482    10/09/2021  f/u ov/Chimene Salo re: chronic asthma    maint on ***  No chief complaint on file.   Dyspnea:  *** Cough: *** Sleeping: *** SABA use: *** 02: *** Covid status:   ***   No obvious day to day or daytime variability or assoc excess/ purulent sputum or mucus plugs or hemoptysis or cp or chest tightness, subjective wheeze or overt sinus or hb symptoms.   *** without nocturnal  or early am exacerbation  of respiratory  c/o's or need for noct saba. Also denies any obvious fluctuation of symptoms with weather or environmental changes or other aggravating or alleviating factors except as outlined above   No unusual exposure hx or h/o childhood pna/   knowledge of premature birth.  Current Allergies, Complete Past Medical History, Past Surgical History, Family History, and Social History were reviewed in [employer] record.  ROS  The following are not active complaints unless bolded Hoarseness, sore throat, dysphagia, dental problems, itching, sneezing,  nasal congestion or  discharge of excess mucus or purulent secretions, ear ache,   fever, chills, sweats, unintended wt loss or wt gain, classically pleuritic or exertional cp,  orthopnea pnd or arm/hand swelling  or leg swelling, presyncope, palpitations, abdominal pain, anorexia, nausea, vomiting, diarrhea  or change in bowel habits or change in bladder habits, change in stools or change in urine, dysuria, hematuria,  rash, arthralgias, visual complaints, headache, numbness, weakness or ataxia or problems with walking or coordination,  change in mood or  memory.        No outpatient medications have been marked as taking for the 10/09/21 encounter (Appointment) with Nyoka Cowden, MD.         Past Medical History:  Diagnosis Date    Anemia    Asthma    Complication of anesthesia    Diabetes mellitus without complication (HCC)    Endometriosis    PONV (postoperative nausea and vomiting)    Tachycardia        Objective:       Wt Readings from Last 3 Encounters:  09/13/21 (!) 306 lb (138.8 kg)  09/12/21 (!) 308 lb (139.7 kg)  08/15/21 (!) 311 lb (141.1 kg)      Vital signs reviewed  10/09/2021  - Note at rest 02 sats  ***% on ***   General appearance:    ***           Assessment

## 2021-10-13 ENCOUNTER — Telehealth: Payer: Self-pay | Admitting: Orthopedic Surgery

## 2021-10-13 ENCOUNTER — Telehealth: Payer: Self-pay | Admitting: Orthopaedic Surgery

## 2021-10-13 NOTE — Telephone Encounter (Signed)
Called patient got recording mailbox is full   please try your call later   Per mychart message patient need earlier appointment with Dr dean

## 2021-10-14 ENCOUNTER — Encounter: Payer: Self-pay | Admitting: Family

## 2021-10-16 ENCOUNTER — Telehealth (INDEPENDENT_AMBULATORY_CARE_PROVIDER_SITE_OTHER): Payer: 59 | Admitting: Psychiatry

## 2021-10-16 ENCOUNTER — Encounter (HOSPITAL_COMMUNITY): Payer: Self-pay | Admitting: Psychiatry

## 2021-10-16 DIAGNOSIS — F331 Major depressive disorder, recurrent, moderate: Secondary | ICD-10-CM

## 2021-10-16 DIAGNOSIS — F410 Panic disorder [episodic paroxysmal anxiety] without agoraphobia: Secondary | ICD-10-CM

## 2021-10-16 DIAGNOSIS — F411 Generalized anxiety disorder: Secondary | ICD-10-CM

## 2021-10-16 MED ORDER — SERTRALINE HCL 50 MG PO TABS: 50.0000 mg | ORAL_TABLET | Freq: Every day | ORAL | 0 refills | Status: DC

## 2021-10-16 NOTE — Progress Notes (Deleted)
Referring-Amy Zonia Kief, NP Reason for referral-tachycardia  HPI: 30 year old female for evaluation of tachycardia at request of Ricky Stabs, NP.  Laboratories September 2022 showed hemoglobin 12.4, TSH 2.290.  Current Outpatient Medications  Medication Sig Dispense Refill   albuterol (VENTOLIN HFA) 108 (90 Base) MCG/ACT inhaler Inhale 1-2 puffs into the lungs every 6 (six) hours as needed for wheezing or shortness of breath. 8 g 2   atorvastatin (LIPITOR) 40 MG tablet Take 1 tablet (40 mg total) by mouth daily. 90 tablet 0   doxycycline (VIBRA-TABS) 100 MG tablet Take 1 tablet (100 mg total) by mouth 2 (two) times daily. 14 tablet 0   gabapentin (NEURONTIN) 300 MG capsule Take 1 capsule (300 mg total) by mouth 3 (three) times daily. 30 capsule 0   hydrOXYzine (VISTARIL) 50 MG capsule Take 1 capsule (50 mg total) by mouth every 8 (eight) hours as needed. 30 capsule 0   ipratropium-albuterol (DUONEB) 0.5-2.5 (3) MG/3ML SOLN Take 3 mLs by nebulization in the morning, at noon, in the evening, and at bedtime.     metoprolol succinate (TOPROL-XL) 25 MG 24 hr tablet Take 1 tablet (25 mg total) by mouth daily. 90 tablet 0   mometasone-formoterol (DULERA) 200-5 MCG/ACT AERO Take 2 puffs first thing in am and then another 2 puffs about 12 hours later. 1 each 11   montelukast (SINGULAIR) 10 MG tablet Take 1 tablet (10 mg total) by mouth at bedtime. 30 tablet 11   pantoprazole (PROTONIX) 40 MG tablet Take 1 tablet (40 mg total) by mouth daily. 30 tablet 2   predniSONE (DELTASONE) 10 MG tablet Take 4 tabs po daily x 3 days; then 3 tabs daily x3 days; then 2 tabs daily x3 days; then 1 tab daily x 3 days; then stop 30 tablet 0   promethazine-dextromethorphan (PROMETHAZINE-DM) 6.25-15 MG/5ML syrup Take 5 mLs by mouth 4 (four) times daily as needed for cough. 1-2 tsp. Every 4 hours as needed for cough 180 mL 0   sertraline (ZOLOFT) 25 MG tablet Take 1 tablet (25 mg total) by mouth daily. 30 tablet 0   No  current facility-administered medications for this visit.    Allergies  Allergen Reactions   Bee Venom Anaphylaxis   Ketorolac Tromethamine Palpitations and Shortness Of Breath   Penicillins Other (See Comments)    Intolerance      Past Medical History:  Diagnosis Date   Anemia    Asthma    Complication of anesthesia    Diabetes mellitus without complication (HCC)    Endometriosis    PONV (postoperative nausea and vomiting)    Tachycardia     Past Surgical History:  Procedure Laterality Date   CHOLECYSTECTOMY     OPEN REDUCTION INTERNAL FIXATION (ORIF) DISTAL RADIAL FRACTURE Right 05/15/2021   Procedure: OPEN REDUCTION INTERNAL FIXATION (ORIF)RIGHT  DISTAL RADIAL FRACTURE;  Surgeon: Cammy Copa, MD;  Location: [facility] OR;  Service: Orthopedics;  Laterality: Right;    Social History   Socioeconomic History   Marital status: Married    Spouse name: Not on file   Number of children: Not on file   Years of education: Not on file   Highest education level: Not on file  Occupational History   Not on file  Tobacco Use   Smoking status: Never   Smokeless tobacco: Never  Vaping Use   Vaping Use: Never used  Substance and Sexual Activity   Alcohol use: No   Drug use: No   Sexual  activity: Not Currently  Other Topics Concern   Not on file  Social History Narrative   Not on file   Social Determinants of Health   Financial Resource Strain: Not on file  Food Insecurity: Not on file  Transportation Needs: Not on file  Physical Activity: Not on file  Stress: Not on file  Social Connections: Not on file  Intimate Partner Violence: Not on file    Family History  Problem Relation Age of Onset   Hypertension Father    Liver cancer Father    Hypertension Mother    Breast cancer Mother    Polycystic ovary syndrome Mother     ROS: no fevers or chills, productive cough, hemoptysis, dysphasia, odynophagia, melena, hematochezia, dysuria, hematuria, rash, seizure  activity, orthopnea, PND, pedal edema, claudication. Remaining systems are negative.  Physical Exam:   There were no vitals taken for this visit.  General:  Well developed/well nourished in NAD Skin warm/dry Patient not depressed No peripheral clubbing Back-normal HEENT-normal/normal eyelids Neck supple/normal carotid upstroke bilaterally; no bruits; no JVD; no thyromegaly chest - CTA/ normal expansion CV - RRR/normal S1 and S2; no murmurs, rubs or gallops;  PMI nondisplaced Abdomen -NT/ND, no HSM, no mass, + bowel sounds, no bruit 2+ femoral pulses, no bruits Ext-no edema, chords, 2+ DP Neuro-grossly nonfocal  ECG - personally reviewed  A/P  1 tachycardia-  2 history of diabetes mellitus-  Olga Millers, MD
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
## 2021-10-23 ENCOUNTER — Ambulatory Visit: Payer: 59 | Admitting: Cardiology

## 2021-10-24 NOTE — Telephone Encounter (Signed)
Pt completed visit w/behavioral health on 10/24 in regards to this matter

## 2021-10-27 ENCOUNTER — Other Ambulatory Visit: Payer: Self-pay | Admitting: Family

## 2021-10-27 DIAGNOSIS — E785 Hyperlipidemia, unspecified: Secondary | ICD-10-CM

## 2021-10-27 DIAGNOSIS — R000 Tachycardia, unspecified: Secondary | ICD-10-CM

## 2021-10-31 ENCOUNTER — Ambulatory Visit (INDEPENDENT_AMBULATORY_CARE_PROVIDER_SITE_OTHER): Payer: 59 | Admitting: Nurse Practitioner

## 2021-10-31 ENCOUNTER — Other Ambulatory Visit: Payer: Self-pay

## 2021-10-31 ENCOUNTER — Telehealth (HOSPITAL_COMMUNITY): Payer: Self-pay | Admitting: Psychiatry

## 2021-10-31 ENCOUNTER — Encounter: Payer: Self-pay | Admitting: Nurse Practitioner

## 2021-10-31 VITALS — BP 122/90 | HR 118 | Resp 18

## 2021-10-31 DIAGNOSIS — M5441 Lumbago with sciatica, right side: Secondary | ICD-10-CM | POA: Insufficient documentation

## 2021-10-31 DIAGNOSIS — M5431 Sciatica, right side: Secondary | ICD-10-CM | POA: Insufficient documentation

## 2021-10-31 MED ORDER — PREDNISONE 20 MG PO TABS: 20.0000 mg | ORAL_TABLET | Freq: Every day | ORAL | 0 refills | Status: AC

## 2021-10-31 MED ORDER — IBUPROFEN 600 MG PO TABS: 600.0000 mg | ORAL_TABLET | Freq: Three times a day (TID) | ORAL | 0 refills | Status: DC | PRN

## 2021-10-31 MED ORDER — TIZANIDINE HCL 4 MG PO TABS: 4.0000 mg | ORAL_TABLET | Freq: Four times a day (QID) | ORAL | 0 refills | Status: DC | PRN

## 2021-10-31 NOTE — Telephone Encounter (Signed)
Dr. Gilmore Laroche will be back tomorrow, please ask him to call her

## 2021-10-31 NOTE — Assessment & Plan Note (Signed)
Will order prednisone  Will order ibuprofen to start after prednisone is complete  Will order tizanidine  Follow up:  Follow up in 2 weeks with PCP

## 2021-10-31 NOTE — Progress Notes (Signed)
@Patient  ID: , female    DOB: [birth year], 30 y.o.   MRN: [id number]  Chief Complaint  Patient presents with   Leg Pain    Referring provider: [id number], NP   HPI  Patient presents today for right leg pain.  She states for the past 3 to 4 weeks she has been having issues with right leg pain that originates in her right buttock and radiates down to her thigh.  She states that at times her leg feels numb.  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  She has tried heat and ice with minimal relief noted.  Denies f/c/s, n/v/d, hemoptysis, PND, chest pain or edema.    Allergies  Allergen Reactions   Bee Venom Anaphylaxis   Ketorolac Tromethamine Palpitations and Shortness Of Breath   Penicillins Other (See Comments)    Intolerance     Immunization History  Administered Date(s) Administered   PFIZER(Purple Top)SARS-COV-2 Vaccination 01/27/2020, 02/25/2020    Past Medical History:  Diagnosis Date   Anemia    Asthma    Complication of anesthesia    Diabetes mellitus without complication (HCC)    Endometriosis    PONV (postoperative nausea and vomiting)    Tachycardia     Tobacco History: Social History   Tobacco Use  Smoking Status Never  Smokeless Tobacco Never   Counseling given: Yes   Outpatient Encounter Medications as of 10/31/2021  Medication Sig   ibuprofen (ADVIL) 600 MG tablet Take 1 tablet (600 mg total) by mouth every 8 (eight) hours as needed.   predniSONE (DELTASONE) 20 MG tablet Take 1 tablet (20 mg total) by mouth daily with breakfast for 5 days.   tiZANidine (ZANAFLEX) 4 MG tablet Take 1 tablet (4 mg total) by mouth every 6 (six) hours as needed for muscle spasms.   albuterol (VENTOLIN HFA) 108 (90 Base) MCG/ACT inhaler Inhale 1-2 puffs into the lungs every 6 (six) hours as needed for wheezing or shortness of breath.   atorvastatin (LIPITOR) 40 MG tablet TAKE 1 TABLET(40 MG) BY MOUTH  DAILY   gabapentin (NEURONTIN) 300 MG capsule Take 1 capsule (300 mg total) by mouth 3 (three) times daily.   hydrOXYzine (VISTARIL) 50 MG capsule Take 1 capsule (50 mg total) by mouth every 8 (eight) hours as needed.   ipratropium-albuterol (DUONEB) 0.5-2.5 (3) MG/3ML SOLN Take 3 mLs by nebulization in the morning, at noon, in the evening, and at bedtime.   metoprolol succinate (TOPROL-XL) 25 MG 24 hr tablet TAKE 1 TABLET(25 MG) BY MOUTH DAILY   mometasone-formoterol (DULERA) 200-5 MCG/ACT AERO Take 2 puffs first thing in am and then another 2 puffs about 12 hours later.   montelukast (SINGULAIR) 10 MG tablet Take 1 tablet (10 mg total) by mouth at bedtime.   pantoprazole (PROTONIX) 40 MG tablet Take 1 tablet (40 mg total) by mouth daily.   sertraline (ZOLOFT) 50 MG tablet Take 1 tablet (50 mg total) by mouth daily.   [DISCONTINUED] doxycycline (VIBRA-TABS) 100 MG tablet Take 1 tablet (100 mg total) by mouth 2 (two) times daily.   [DISCONTINUED] predniSONE (DELTASONE) 10 MG tablet Take 4 tabs po daily x 3 days; then 3 tabs daily x3 days; then 2 tabs daily x3 days; then 1 tab daily x 3 days; then stop   [DISCONTINUED] promethazine-dextromethorphan (PROMETHAZINE-DM) 6.25-15 MG/5ML syrup Take 5 mLs by mouth 4 (four) times daily as needed for cough. 1-2 tsp. Every 4  hours as needed for cough   No facility-administered encounter medications on file as of 10/31/2021.     Review of Systems  Review of Systems  Constitutional: Negative.   HENT: Negative.    Cardiovascular: Negative.   Gastrointestinal: Negative.   Musculoskeletal:        Right thigh pain  Allergic/Immunologic: Negative.   Neurological: Negative.   Psychiatric/Behavioral: Negative.        Physical Exam  BP 122/90   Pulse (!) 118   Resp 18   SpO2 97%   Wt Readings from Last 5 Encounters:  09/13/21 (!) 306 lb (138.8 kg)  09/12/21 (!) 308 lb (139.7 kg)  08/15/21 (!) 311 lb (141.1 kg)  08/14/21 (!) 306 lb 3.2 oz (138.9  kg)  05/15/21 (!) 310 lb (140.6 kg)     Physical Exam Vitals and nursing note reviewed.  Constitutional:      General: She is not in acute distress.    Appearance: She is well-developed.  Cardiovascular:     Rate and Rhythm: Normal rate and regular rhythm.  Pulmonary:     Effort: Pulmonary effort is normal.     Breath sounds: Normal breath sounds.  Musculoskeletal:       Back:     Comments: Tenderness to lower back noted that radiates down right thigh.  Neurological:     Mental Status: She is alert and oriented to person, place, and time.     Lab Results:  CBC    Component Value Date/Time   WBC 14.6 (H) 09/12/2021 1647   RBC 4.82 09/12/2021 1647   HGB 12.4 09/12/2021 1647   HGB 13.3 08/14/2021 1724   HCT 39.3 09/12/2021 1647   HCT 41.5 08/14/2021 1724   PLT 430.0 (H) 09/12/2021 1647   PLT 438 08/14/2021 1724   MCV 81.4 09/12/2021 1647   MCV 80 08/14/2021 1724   MCH 25.7 (L) 08/14/2021 1724   MCH 25.5 (L) 05/15/2021 1410   MCHC 31.6 09/12/2021 1647   RDW 15.7 (H) 09/12/2021 1647   RDW 14.7 08/14/2021 1724   LYMPHSABS 2.3 09/12/2021 1647   MONOABS 1.0 09/12/2021 1647   EOSABS 0.2 09/12/2021 1647   BASOSABS 0.0 09/12/2021 1647    BMET    Component Value Date/Time   NA 136 05/15/2021 1410   K 4.0 05/15/2021 1410   CL 103 05/15/2021 1410   CO2 22 05/15/2021 1410   GLUCOSE 81 05/15/2021 1410   BUN 11 05/15/2021 1410   CREATININE 0.73 05/15/2021 1410   CALCIUM 9.0 05/15/2021 1410   GFRNONAA >60 05/15/2021 1410   GFRAA >60 02/27/2018 0716    BNP No results found for: BNP  ProBNP No results found for: PROBNP  Imaging: No results found.   Assessment & Plan:   Acute right-sided low back pain with right-sided sciatica Will order prednisone  Will order ibuprofen to start after prednisone is complete  Will order tizanidine  Follow up:  Follow up in 2 weeks with PCP     Ivonne Andrew, NP 10/31/2021

## 2021-10-31 NOTE — Telephone Encounter (Signed)
Pt left a vm - stating that she can't sleep and wants to speak with someone about what to do.   Next appt. 11/14 with Dr. Gilmore Laroche

## 2021-10-31 NOTE — Patient Instructions (Addendum)
Sciatica:  Will order prednisone  Will order ibuprofen to start after prednisone is complete  Will order tizanidine  Follow up:  Follow up in 2 weeks with PCP    Sciatica Sciatica is pain, weakness, tingling, or loss of feeling (numbness) along the sciatic nerve. The sciatic nerve starts in the lower back and goes down the back of each leg. Sciatica usually goes away on its own or with treatment. Sometimes, sciatica may come back (recur). What are the causes? This condition happens when the sciatic nerve is pinched or has pressure put on it. This may be the result of: A disk in between the bones of the spine bulging out too far (herniated disk). Changes in the spinal disks that occur with aging. A condition that affects a muscle in the butt. Extra bone growth near the sciatic nerve. A break (fracture) of the area between your hip bones (pelvis). Pregnancy. Tumor. This is rare. What increases the risk? You are more likely to develop this condition if you: Play sports that put pressure or stress on the spine. Have poor strength and ease of movement (flexibility). Have had a back injury in the past. Have had back surgery. Sit for long periods of time. Do activities that involve bending or lifting over and over again. Are very overweight (obese). What are the signs or symptoms? Symptoms can vary from mild to very bad. They may include: Any of these problems in the lower back, leg, hip, or butt: Mild tingling, loss of feeling, or dull aches. Burning sensations. Sharp pains. Loss of feeling in the back of the calf or the sole of the foot. Leg weakness. Very bad back pain that makes it hard to move. These symptoms may get worse when you cough, sneeze, or laugh. They may also get worse when you sit or stand for long periods of time. How is this treated? This condition often gets better without any treatment. However, treatment may include: Changing or cutting back on physical  activity when you have pain. Doing exercises and stretching. Putting ice or heat on the affected area. Medicines that help: To relieve pain and swelling. To relax your muscles. Shots (injections) of medicines that help to relieve pain, irritation, and swelling. Surgery. Follow these instructions at home: Medicines Take over-the-counter and prescription medicines only as told by your doctor. Ask your doctor if the medicine prescribed to you: Requires you to avoid driving or using heavy machinery. Can cause trouble pooping (constipation). You may need to take these steps to prevent or treat trouble pooping: Drink enough fluids to keep your pee (urine) pale yellow. Take over-the-counter or prescription medicines. Eat foods that are high in fiber. These include beans, whole grains, and fresh fruits and vegetables. Limit foods that are high in fat and sugar. These include fried or sweet foods. Managing pain   If told, put ice on the affected area. Put ice in a plastic bag. Place a towel between your skin and the bag. Leave the ice on for 20 minutes, 2-3 times a day. If told, put heat on the affected area. Use the heat source that your doctor tells you to use, such as a moist heat pack or a heating pad. Place a towel between your skin and the heat source. Leave the heat on for 20-30 minutes. Remove the heat if your skin turns bright red. This is very important if you are unable to feel pain, heat, or cold. You may have a greater risk of getting  burned. Activity  Return to your normal activities as told by your doctor. Ask your doctor what activities are safe for you. Avoid activities that make your symptoms worse. Take short rests during the day. When you rest for a long time, do some physical activity or stretching between periods of rest. Avoid sitting for a long time without moving. Get up and move around at least one time each hour. Exercise and stretch regularly, as told by your  doctor. Do not lift anything that is heavier than 10 lb (4.5 kg) while you have symptoms of sciatica. Avoid lifting heavy things even when you do not have symptoms. Avoid lifting heavy things over and over. When you lift objects, always lift in a way that is safe for your body. To do this, you should: Bend your knees. Keep the object close to your body. Avoid twisting. General instructions Stay at a healthy weight. Wear comfortable shoes that support your feet. Avoid wearing high heels. Avoid sleeping on a mattress that is too soft or too hard. You might have less pain if you sleep on a mattress that is firm enough to support your back. Keep all follow-up visits as told by your doctor. This is important. Contact a doctor if: You have pain that: Wakes you up when you are sleeping. Gets worse when you lie down. Is worse than the pain you have had in the past. Lasts longer than 4 weeks. You lose weight without trying. Get help right away if: You cannot control when you pee (urinate) or poop (have a bowel movement). You have weakness in any of these areas and it gets worse: Lower back. The area between your hip bones. Butt. Legs. You have redness or swelling of your back. You have a burning feeling when you pee. Summary Sciatica is pain, weakness, tingling, or loss of feeling (numbness) along the sciatic nerve. This condition happens when the sciatic nerve is pinched or has pressure put on it. Sciatica can cause pain, tingling, or loss of feeling (numbness) in the lower back, legs, hips, and butt. Treatment often includes rest, exercise, medicines, and putting ice or heat on the affected area. This information is not intended to replace advice given to you by your health care provider. Make sure you discuss any questions you have with your health care provider. Document Revised: 12/29/2018 Document Reviewed: 12/29/2018 Elsevier Patient Education  2022 [employer].

## 2021-11-01 MED ORDER — TRAZODONE HCL 50 MG PO TABS: 50.0000 mg | ORAL_TABLET | Freq: Every day | ORAL | 0 refills | Status: DC

## 2021-11-01 NOTE — Telephone Encounter (Signed)
I called and left vm informing pt of everything Dr. Gilmore Laroche stated in previous message and told her to call us and let us know if she wants the Trazodone sent to the pharmacy.

## 2021-11-01 NOTE — Telephone Encounter (Signed)
Patient just called back and she would like for you to send the Trazodone.  [facility] [address] in [place]

## 2021-11-01 NOTE — Addendum Note (Signed)
Addended by: Thresa Ross on: 11/01/2021 11:06 AM   Modules accepted: Orders

## 2021-11-01 NOTE — Telephone Encounter (Signed)
She was also prescribed prednisone. Is she tolerating this o.k.? This should help even without the muscle relaxer. Thanks.  Angus Seller, FNP-C

## 2021-11-06 ENCOUNTER — Encounter: Payer: Self-pay | Admitting: Family

## 2021-11-06 ENCOUNTER — Ambulatory Visit (INDEPENDENT_AMBULATORY_CARE_PROVIDER_SITE_OTHER): Payer: 59 | Admitting: Surgical

## 2021-11-06 ENCOUNTER — Encounter (HOSPITAL_COMMUNITY): Payer: Self-pay | Admitting: Psychiatry

## 2021-11-06 ENCOUNTER — Telehealth (INDEPENDENT_AMBULATORY_CARE_PROVIDER_SITE_OTHER): Payer: 59 | Admitting: Psychiatry

## 2021-11-06 ENCOUNTER — Other Ambulatory Visit: Payer: Self-pay

## 2021-11-06 DIAGNOSIS — F5102 Adjustment insomnia: Secondary | ICD-10-CM | POA: Diagnosis not present

## 2021-11-06 DIAGNOSIS — F411 Generalized anxiety disorder: Secondary | ICD-10-CM

## 2021-11-06 DIAGNOSIS — F331 Major depressive disorder, recurrent, moderate: Secondary | ICD-10-CM

## 2021-11-06 DIAGNOSIS — F410 Panic disorder [episodic paroxysmal anxiety] without agoraphobia: Secondary | ICD-10-CM

## 2021-11-06 DIAGNOSIS — S52551A Other extraarticular fracture of lower end of right radius, initial encounter for closed fracture: Secondary | ICD-10-CM | POA: Diagnosis not present

## 2021-11-06 MED ORDER — SERTRALINE HCL 50 MG PO TABS: 75.0000 mg | ORAL_TABLET | Freq: Every day | ORAL | 0 refills | Status: DC
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
## 2021-11-07 ENCOUNTER — Ambulatory Visit (HOSPITAL_COMMUNITY): Payer: 59 | Admitting: Licensed Clinical Social Worker

## 2021-11-07 ENCOUNTER — Ambulatory Visit (INDEPENDENT_AMBULATORY_CARE_PROVIDER_SITE_OTHER): Payer: 59 | Admitting: Internal Medicine

## 2021-11-07 ENCOUNTER — Encounter: Payer: Self-pay | Admitting: Internal Medicine

## 2021-11-07 ENCOUNTER — Encounter (HOSPITAL_COMMUNITY): Payer: Self-pay

## 2021-11-07 ENCOUNTER — Telehealth: Payer: Self-pay | Admitting: Internal Medicine

## 2021-11-07 VITALS — BP 128/80 | HR 120 | Temp 98.8°F | Ht 65.0 in | Wt 310.2 lb

## 2021-11-07 DIAGNOSIS — R0781 Pleurodynia: Secondary | ICD-10-CM | POA: Diagnosis not present

## 2021-11-07 DIAGNOSIS — Z23 Encounter for immunization: Secondary | ICD-10-CM | POA: Diagnosis not present

## 2021-11-07 DIAGNOSIS — J45909 Unspecified asthma, uncomplicated: Secondary | ICD-10-CM | POA: Diagnosis not present

## 2021-11-07 NOTE — Patient Instructions (Addendum)
Plan A = Automatic = Always=    Dulera 1-2 pffs every 12 hours   Plan B = Backup (to supplement plan A, not to replace it) Only use your albuterol inhaler as a rescue medication to be used if you can't catch your breath by resting or doing a relaxed purse lip breathing pattern.  - The less you use it, the better it will work when you need it. - Ok to use the inhaler up to 2 puffs  every 4 hours if you must but call for appointment if use goes up over your usual need - Don't leave home without it !!  (think of it like the spare tire for your car)   Plan C = Crisis (instead of Plan B but only if Plan B stops working) - only use your albuterol nebulizer if you first try Plan B and it fails to help > ok to use the nebulizer up to every 4 hours but if start needing it regularly call for immediate appointment  Flu shot today  You will need  to go to ER at [facility]  across from [address]

## 2021-11-07 NOTE — Progress Notes (Signed)
Tina Manning, female    DOB: 05/02/1991   MRN: [id number]   Brief patient profile:  30 yowf never smoker with childhood asthma typically needing saba only with PE while living in [place] where became more of a chronic issue around 2020 but still only on saba as "maint" and required  intubation x 10 days  Thanksgiving 2021 at [facility] in [place] referred to pulmonary clinic 08/15/2021 by Ricky Stabs, NP for asthma.  Prior to the chronic asthma problem in 2020 was maintaining  wt  around 230    History of Present Illness  08/15/2021  Pulmonary/ 1st office eval/Karlee Staff no maint rx  since Feb 2022 arrived in [place] Chief Complaint  Patient presents with   Consult    Patient reports increased shortness of breath in last 2 months at rest and with exertion.   Dyspnea:  can finish [employer] grocery store s stopping  Cough: none Sleep: 45 degrees x sev years SABA use: saba hfa avg 4-5 x per day/finished pred x 5 days prior to OV  - last dose 3.5 h prior to OV   Rec Plan A = Automatic = Always=  Dulera 200 (or Breztri) Take 2 puffs first thing in am and then another 2 puffs about 12 hours later.  And take Pantoprazole (protonix) 40 mg   Take  30-60 min before first meal of the day and Pepcid (famotidine)  20 mg after supper    Work on inhaler technique:    Plan B = Backup (to supplement plan A, not to replace it) Only use your albuterol inhaler as a rescue medication Plan C = Crisis (instead of Plan B but only if Plan B stops working) - only use your albuterol nebulizer if you first try Plan B and it fails to help > ok to use the nebulizer up to every 4 hours but if start needing it regularly call for immediate appointment Also ok to Try albuterol 15 min before an activity (on alternating days)  that you know would make you short of breath and see if it makes any difference and if makes none then don't take albuterol after activity unless you can't catch your breath as this means it's the  resting that helps, not the albuterol. Depomedrol 120 mg IM  GERD diet reviewed, bed blocks rec   Singulair added by NP  09/12/21 with Allergy profile    Eos 0.2/  IgE  482   11/07/2021  f/u ov/Nyaisha Simao re: chronic asthma  maint on dulera  200 bid due to ha  Chief Complaint  Patient presents with   Follow-up    Upper left back pain with inspiration last pm.  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     Also denies any obvious fluctuation of symptoms with weather or environmental changes or other aggravating or alleviating factors except  as outlined above   No unusual exposure hx or h/o childhood pna/   knowledge of premature birth.  Current Allergies, Complete Past Medical History, Past Surgical History, Family History, and Social History were reviewed in [employer] record.  ROS  The following are not active complaints unless bolded Hoarseness, sore throat, dysphagia, dental problems, itching, sneezing,  nasal congestion or discharge of excess mucus or purulent secretions, ear ache,   fever, chills, sweats, unintended wt loss or wt gain, classically exertional cp,  orthopnea pnd or arm/hand swelling  or leg swelling, presyncope, palpitations, abdominal pain, anorexia, nausea, vomiting, diarrhea  or change in bowel habits or change in bladder habits, change in stools or change in urine, dysuria, hematuria,  rash, arthralgias, visual complaints, headache, numbness, weakness or  ataxia or problems with walking or coordination,  change in mood or  memory.        Current Meds  Medication Sig   albuterol (VENTOLIN HFA) 108 (90 Base) MCG/ACT inhaler Inhale 1-2 puffs into the lungs every 6 (six) hours as needed for wheezing or shortness of breath.   atorvastatin (LIPITOR) 40 MG tablet TAKE 1 TABLET(40 MG) BY MOUTH DAILY   hydrOXYzine (VISTARIL) 50 MG capsule Take 1 capsule (50 mg total) by mouth every 8 (eight) hours as needed.   ibuprofen (ADVIL) 600 MG tablet Take 1 tablet (600 mg total) by mouth every 8 (eight) hours as needed.   ipratropium-albuterol (DUONEB) 0.5-2.5 (3) MG/3ML SOLN Take 3 mLs by nebulization in the morning, at noon, in the evening, and at bedtime.   metoprolol succinate (TOPROL-XL) 25 MG 24 hr tablet TAKE 1 TABLET(25 MG) BY MOUTH DAILY   mometasone-formoterol (DULERA) 200-5 MCG/ACT AERO Take 2 puffs first thing in am and then another 2 puffs about 12 hours later.   montelukast (SINGULAIR) 10 MG tablet Take 1 tablet (10 mg total) by mouth at bedtime.   pantoprazole (PROTONIX) 40 MG tablet Take 1 tablet (40 mg total) by mouth daily.   sertraline (ZOLOFT) 50 MG tablet Take 1.5 tablets (75 mg total) by mouth daily.   tiZANidine (ZANAFLEX) 4 MG tablet Take 1 tablet (4 mg total) by mouth every 6 (six) hours as needed for muscle spasms.   traZODone (DESYREL) 50 MG tablet Take 1 tablet (50 mg total) by mouth at bedtime. Take half for first 2 nights and then half or one at night for sleep         Past Medical History:  Diagnosis Date   Anemia    Asthma    Complication of anesthesia    Diabetes mellitus without complication ([place])    Endometriosis    PONV (postoperative nausea and vomiting)    Tachycardia        Objective:       11/07/2021        310   09/13/21 (!) 306 lb (138.8 kg)  09/12/21 (!) 308 lb (139.7 kg)  08/15/21 (!) 311 lb (141.1 kg)      Vital signs reviewed  11/07/2021  - Note at rest 02 sats  97% on RA   General appearance:     obese amb wf nad but pulse  120 on arrival      HEENT : pt wearing mask not removed for exam due to covid -19 concerns.    NECK :  without JVD/Nodes/TM/ nl carotid upstrokes bilaterally   LUNGS: no acc muscle use,  Nl contour chest with distant bs  bilaterally without cough on  insp or exp maneuvers -no def rub    CV:  RRR  no s3 or murmur or increase in P2, and no edema   ABD:  soft and nontender with nl inspiratory excursion in the supine position. No bruits or organomegaly appreciated, bowel sounds nl  MS:  Nl gait/ ext warm without deformities, calf tenderness, cyanosis or clubbing No obvious joint restrictions / manipulation of L shoulder/humerus against resistance did not reproduce pain  SKIN: warm and dry without lesions    NEURO:  alert, approp, nl sensorium with  no motor or cerebellar deficits apparent.         Assessment

## 2021-11-07 NOTE — Telephone Encounter (Signed)
Spoke with the pt  She wanted to let Dr Sherene Sires know that she is unable to go to ED as he rec at ov today 11/07/21 She states that she has to pick her spouse up from work and also go take care of her Father  She states will go to hospital later if she starts to feel worse  I urged her to go when she can ASAP to r/o PE per Dr Thurston Hole recommendation Forwarding to him as Lorain Childes

## 2021-11-07 NOTE — Assessment & Plan Note (Signed)
Onset pm 11/06/21 > refused ER eval   The abrupt onset of L pleuritic cp s cough or MS injury to chest or shoulder along with sinus Tach are all worrisome for PE > advised to go directly to ER but wants to pick up husband first and "call us back if condition worsened"  I advised against this and feel PE is dx of exclusion 1st and explained why to pt.  F/u can be in 6 weeks once we sort out her acute problem preferably today.          Each maintenance medication was reviewed in detail including emphasizing most importantly the difference between maintenance and prns and under what circumstances the prns are to be triggered using an action plan format where appropriate.  Total time for H and P, chart review, counseling, reviewing hfa  device(s) and generating customized AVS unique to this office visit / same day charting > 30 min

## 2021-11-07 NOTE — ED Notes (Signed)
Per Dr Hilma Favors, patient seen today for pleuritic pain-sending over tor a PE work up

## 2021-11-07 NOTE — Telephone Encounter (Signed)
Pt was advised of seriousness of PE as life threatening   Send PCP copy of this phone note

## 2021-11-07 NOTE — Assessment & Plan Note (Signed)
Onset age 30 - intubation [place] in Nov 2021 - 08/15/2021  After extensive coaching inhaler device,  effectiveness =   75% > try dulera 200 (breztri sample x one)  - Singulair added  09/12/21 with Allergy profile    Eos 0.2/  IgE  482 - 11/07/2021  After extensive coaching inhaler device,  effectiveness =   80%  Doing much better on duler 200 even at dose of 100 one bid so suggested she increase to 2bid and use saba prn using rule of 2's:  If your breathing worsens or you need to use your rescue inhaler more than twice weekly or wake up more than twice a month with any respiratory symptoms or require more than two rescue inhalers per year, we need to see you right away because this means we're not controlling the underlying problem (inflammation) adequately.  Rescue inhalers (albuterol) do not control inflammation and overuse can lead to unnecessary and costly consequences.  They can make you feel better temporarily but eventually they will quit working effectively much as sleep aids lead to more insomnia if used regularly.

## 2021-11-07 NOTE — Assessment & Plan Note (Signed)
Body mass index is 51.62 kg/m.  -  trending  Up  Lab Results  Component Value Date   TSH 2.290 08/14/2021      Contributing to doe and risk of GERD and PE  >>>   reviewed the need and the process to achieve and maintain neg calorie balance > defer f/u primary care including intermittently monitoring thyroid status

## 2021-11-08 NOTE — Telephone Encounter (Signed)
Received a response from patient stating that she is ok with having the CXR and labs. She wanted to come around 445pm but I advised her that our lab shuts down at 445pm each day. Otherwise she wants to come back on Monday or Tuesday of next week.   MW, can you please advise? Thanks!

## 2021-11-08 NOTE — Telephone Encounter (Signed)
Called and spoke with patient. I was able to get her scheduled to see Beth on 11/13/21 at 10am. She is aware that if she develops any other symptoms that she will need to go to the ED.  Nothing further needed at time of call.

## 2021-11-08 NOTE — Telephone Encounter (Signed)
Again I don't recommend waiting until next week - if that's her plan then just document she declined and have her see one of the NP 's next week if not 100% back to [place] and again go to er in meantime if condition worsens

## 2021-11-08 NOTE — Telephone Encounter (Signed)
MW   please advise. 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 Has to be a Monday or Tuesday that's the only days I get off I appreciate your concern I just have to take care of my family if you can't schedule the tests I fully understand .

## 2021-11-12 ENCOUNTER — Encounter: Payer: Self-pay | Admitting: Orthopedic Surgery

## 2021-11-12 NOTE — Progress Notes (Signed)
Office Visit Note   Patient: Tina Manning           Date of Birth: 03/06/1991           MRN: [id number] Visit Date: 11/06/2021 Requested by: Rema Fendt, NP [address] [place],  [place] [address] PCP: Rema Fendt, NP  Subjective: Chief Complaint  Patient presents with   Right Forearm - Follow-up    05/15/2021 ORIF right distal radius    HPI: Tina Manning is a 30 y.o. female who presents to the office s/p right distal radius ORIF on 05/15/2021.  She is here for 63-month follow-up visit.  She states that she is doing well.  She has been able to return to work in a near full capacity at this point.  She states the palmar numbness she was having at the previous appointments has resolved completely.  She has an occasional "zinging" of pain with certain movements but overall her pain is much better controlled these days.  She only has to take occasional ibuprofen and this relieves her pain.  Denies any fevers, chills, night sweats, drainage from the incision.  Incision is healing well.  On examination, patient has 70 degrees of right wrist extension and 45 degrees of wrist flexion.  She has full pronation and supination compared with the contralateral side.  Incision is well-healed.  Neurovascular intact with intact EPL, FPL, finger abduction, finger adduction, wrist extension with 2+ radial pulse of the right upper extremity.  Minimal to no tenderness on exam over the distal radius  Plan is to release patient.  Glad the numbness has resolved and she seems to be doing well..  Follow-up with the office as needed.              ROS: All systems reviewed are negative as they relate to the chief complaint within the history of present illness.  Patient denies fevers or chills.  Assessment & Plan: Visit Diagnoses: No diagnosis found.  Plan: See above  Follow-Up Instructions: No follow-ups on file.   Orders:  No orders of the defined types were placed in this  encounter.  No orders of the defined types were placed in this encounter.     Procedures: No procedures performed   Clinical Data: No additional findings.  Objective: Vital Signs: There were no vitals taken for this visit.  Physical Exam:  Constitutional: Patient appears well-developed HEENT:  Head: Normocephalic Eyes:EOM are normal Neck: Normal range of motion Cardiovascular: Normal rate Pulmonary/chest: Effort normal Neurologic: Patient is alert Skin: Skin is warm Psychiatric: Patient has normal mood and affect  Ortho Exam: See above  Specialty Comments:  No specialty comments available.  Imaging: No results found.   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   Hyperlipidemia 08/15/2021   Prediabetes 08/15/2021   Morbid obesity due to excess calories (HCC) 08/15/2021   Closed fracture of lower end of right radius with routine healing    Anxiety and depression 08/09/2011   Endometriosis 03/27/2009   Tachycardia 11/27/1998   Asthma 04/24/1994   Past Medical History:  Diagnosis Date   Anemia    Asthma    Complication of anesthesia    Diabetes mellitus without complication (HCC)  Endometriosis    PONV (postoperative nausea and vomiting)    Tachycardia     Family History  Problem Relation Age of Onset   Hypertension Father    Liver cancer Father    Hypertension Mother    Breast cancer Mother    Polycystic ovary syndrome Mother     Past Surgical History:  Procedure Laterality Date   CHOLECYSTECTOMY     OPEN REDUCTION INTERNAL FIXATION (ORIF) DISTAL RADIAL FRACTURE Right 05/15/2021   Procedure: OPEN REDUCTION INTERNAL FIXATION (ORIF)RIGHT  DISTAL RADIAL FRACTURE;  Surgeon: Meredith Pel, MD;   Location: [facility];  Service: Orthopedics;  Laterality: Right;   Social History   Occupational History   Not on file  Tobacco Use   Smoking status: Never   Smokeless tobacco: Never  Vaping Use   Vaping Use: Never used  Substance and Sexual Activity   Alcohol use: No   Drug use: No   Sexual activity: Not Currently

## 2021-11-13 ENCOUNTER — Encounter: Payer: Self-pay | Admitting: Primary Care

## 2021-11-13 ENCOUNTER — Other Ambulatory Visit: Payer: Self-pay

## 2021-11-13 ENCOUNTER — Ambulatory Visit (INDEPENDENT_AMBULATORY_CARE_PROVIDER_SITE_OTHER): Payer: 59

## 2021-11-13 ENCOUNTER — Ambulatory Visit (INDEPENDENT_AMBULATORY_CARE_PROVIDER_SITE_OTHER): Payer: 59 | Admitting: Primary Care

## 2021-11-13 VITALS — BP 126/80 | HR 99 | Temp 98.7°F | Ht 65.0 in | Wt 310.2 lb

## 2021-11-13 DIAGNOSIS — J45909 Unspecified asthma, uncomplicated: Secondary | ICD-10-CM | POA: Diagnosis not present

## 2021-11-13 DIAGNOSIS — R0602 Shortness of breath: Secondary | ICD-10-CM

## 2021-11-13 DIAGNOSIS — G4700 Insomnia, unspecified: Secondary | ICD-10-CM

## 2021-11-13 DIAGNOSIS — R0781 Pleurodynia: Secondary | ICD-10-CM | POA: Diagnosis not present

## 2021-11-13 LAB — BASIC METABOLIC PANEL
BUN: 11 mg/dL (ref 6–23)
CO2: 22 mEq/L (ref 19–32)
Calcium: 8.5 mg/dL (ref 8.4–10.5)
Chloride: 107 mEq/L (ref 96–112)
Creatinine, Ser: 0.8 mg/dL (ref 0.40–1.20)
GFR: 98.77 mL/min (ref >60.00)
Glucose, Bld: 96 mg/dL (ref 70–99)
Potassium: 3.8 mEq/L (ref 3.5–5.1)
Sodium: 137 mEq/L (ref 135–145)

## 2021-11-13 LAB — CBC WITH DIFFERENTIAL/PLATELET
Basophils Absolute: 0.1 10*3/uL (ref 0.0–0.1)
Basophils Relative: 0.9 % (ref 0.0–3.0)
Eosinophils Absolute: 0.6 10*3/uL (ref 0.0–0.7)
Eosinophils Relative: 5.4 % — ABNORMAL HIGH (ref 0.0–5.0)
HCT: 38.8 % (ref 36.0–46.0)
Hemoglobin: 12.3 g/dL (ref 12.0–15.0)
Lymphocytes Relative: 18.2 % (ref 12.0–46.0)
Lymphs Abs: 1.9 10*3/uL (ref 0.7–4.0)
MCHC: 31.8 g/dL (ref 30.0–36.0)
MCV: 81.8 fl (ref 78.0–100.0)
Monocytes Absolute: 0.7 10*3/uL (ref 0.1–1.0)
Monocytes Relative: 6.5 % (ref 3.0–12.0)
Neutro Abs: 7.1 10*3/uL (ref 1.4–7.7)
Neutrophils Relative %: 69 % (ref 43.0–77.0)
Platelets: 393 10*3/uL (ref 150.0–400.0)
RBC: 4.75 Mil/uL (ref 3.87–5.11)
RDW: 16.5 % — ABNORMAL HIGH (ref 11.5–15.5)
WBC: 10.3 10*3/uL (ref 4.0–10.5)

## 2021-11-13 LAB — D-DIMER, QUANTITATIVE: D-Dimer, Quant: 0.34 mcg/mL FEU (ref <0.50)

## 2021-11-13 MED ORDER — TRAZODONE HCL 100 MG PO TABS: 100.0000 mg | ORAL_TABLET | Freq: Every evening | ORAL | 1 refills | Status: DC | PRN

## 2021-11-13 MED ORDER — PREDNISONE 10 MG PO TABS: ORAL_TABLET | ORAL | 0 refills | Status: DC

## 2021-11-13 NOTE — Progress Notes (Signed)
Please let patient know D-dimer and CXR were normal. No changed to plan. Low suspicion for PE, no CTA imaging needed until symptoms represent.

## 2021-11-13 NOTE — Assessment & Plan Note (Signed)
-   Pain has resolved. Likely musculoskeletal. We are checking d-dimer/basic labs, if elevated needs CTA to rule out PE.

## 2021-11-13 NOTE — Progress Notes (Signed)
@Patient  ID: Tina Manning, female    DOB: [birth year], 30 y.o.   MRN: [id number]  Chief Complaint  Patient presents with   Follow-up    FU pleuritic pain     Referring provider: Camillia Herter, NP  HPI: 30 year female, never smoked. PMH significant for asthma, obesity,  hyperlipidemia, prediabetes. Patient of Dr. Melvyn Novas, last seen on 11/07/21.   Previous [facility] pulmonary encounter: 11/07/2021  f/u ov/Wert re: chronic asthma  maint on dulera  200 bid due to ha      Chief Complaint  Patient presents with   Follow-up      Upper left back pain with inspiration last pm.  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      Also denies any obvious fluctuation of symptoms with weather or environmental changes or other aggravating or alleviating factors except as outlined above    No unusual exposure hx or h/o childhood pna/   knowledge of premature birth.  The abrupt onset of L pleuritic cp s cough or MS injury to chest or shoulder along with sinus Tach are all worrisome for PE > advised to go directly to ER but wants to pick up husband first and "call us back if condition worsened"  I advised against this and feel PE is dx of exclusion 1st and explained why to pt.   F/u can be in 6 weeks once we sort out her acute problem preferably today.   11/13/2021- Interim hx  Patient presents today for acute follow-up. She was seen on 11/15 for pleuritic chest pain and told she needed to go to ED, however,  she declined. Dr. Melvyn Novas recommended chest imaging and labs, these have not been ordered or completed. We will get these today.   She is no longer experiencing pleuritic chest pain. Pain lasted for 2 days and went away. 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 She is dealing with some life stress and is following with both behavioral health and psychiatry. She is taking Dulera two puffs morning and evening. She has a productive cough with clear mucus at night. She denies shortness of breath.    Allergies  Allergen Reactions   Bee Venom Anaphylaxis   Ketorolac Tromethamine Palpitations and Shortness Of Breath   Penicillins Other (See Comments)    Intolerance     Immunization History  Administered Date(s) Administered   Influenza,inj,Quad PF,6+ Mos 11/07/2021   PFIZER(Purple Top)SARS-COV-2 Vaccination 01/27/2020, 02/25/2020    Past Medical History:  Diagnosis Date   Anemia    Asthma    Complication of anesthesia    Diabetes mellitus without complication (HCC)    Endometriosis    PONV (postoperative nausea and vomiting)    Tachycardia     Tobacco History: Social  History   Tobacco Use  Smoking Status Never  Smokeless Tobacco Never   Counseling given: Not Answered   Outpatient Medications Prior to Visit  Medication Sig Dispense Refill   albuterol (VENTOLIN HFA) 108 (90 Base) MCG/ACT inhaler Inhale 1-2 puffs into the lungs every 6 (six) hours as needed for wheezing or shortness of breath. 8 g 2   atorvastatin (LIPITOR) 40 MG tablet TAKE 1 TABLET(40 MG) BY MOUTH DAILY 90 tablet 0   hydrOXYzine (VISTARIL) 50 MG capsule Take 1 capsule (50 mg total) by mouth every 8 (eight) hours as needed. 30 capsule 0   ibuprofen (ADVIL) 600 MG tablet Take 1 tablet (600 mg total) by mouth every 8 (eight) hours as needed. 30 tablet 0   ipratropium-albuterol (DUONEB) 0.5-2.5 (3) MG/3ML SOLN Take 3  mLs by nebulization in the morning, at noon, in the evening, and at bedtime.     metoprolol succinate (TOPROL-XL) 25 MG 24 hr tablet TAKE 1 TABLET(25 MG) BY MOUTH DAILY 90 tablet 0   mometasone-formoterol (DULERA) 200-5 MCG/ACT AERO Take 2 puffs first thing in am and then another 2 puffs about 12 hours later. 1 each 11   montelukast (SINGULAIR) 10 MG tablet Take 1 tablet (10 mg total) by mouth at bedtime. 30 tablet 11   pantoprazole (PROTONIX) 40 MG tablet Take 1 tablet (40 mg total) by mouth daily. 30 tablet 2   sertraline (ZOLOFT) 50 MG tablet Take 1.5 tablets (75 mg total) by mouth daily. 45 tablet 0   tiZANidine (ZANAFLEX) 4 MG tablet Take 1 tablet (4 mg total) by mouth every 6 (six) hours as needed for muscle spasms. 30 tablet 0   traZODone (DESYREL) 50 MG tablet Take 1 tablet (50 mg total) by mouth at bedtime. Take half for first 2 nights and then half or one at night for sleep (Patient not taking: Reported on 11/13/2021) 30 tablet 0   No facility-administered medications prior to visit.   Review of Systems  Review of Systems  Constitutional: Negative.   HENT: Negative.    Respiratory:  Positive for cough and wheezing. Negative for chest tightness and shortness of breath.   Cardiovascular:  Negative for chest pain.    Physical Exam  BP 126/80 (BP Location: Right Wrist, Patient Position: Sitting, Cuff Size: Normal)   Pulse 99   Temp 98.7 F (37.1 C) (Oral)   Ht 5\' 5"  (1.651 m)   Wt (!) 310 lb 3.2 oz (140.7 kg)   SpO2 96%   BMI 51.62 kg/m  Physical Exam Constitutional:      General: She is not in acute distress.    Appearance: Normal appearance. She is obese. She is not ill-appearing.  HENT:     Head: Normocephalic and atraumatic.     Mouth/Throat:     Mouth: Mucous membranes are moist.     Pharynx: Oropharynx is clear.  Cardiovascular:     Rate and Rhythm: Normal rate and regular rhythm.  Pulmonary:     Effort: Pulmonary effort is normal.     Breath sounds: Wheezing  present. No rhonchi or rales.  Musculoskeletal:        General: Normal range of motion.  Skin:    General: Skin is warm and dry.  Neurological:     General: No focal deficit present.     Mental Status: She is alert and oriented to person, place, and time. Mental status is at baseline.  Psychiatric:        Mood and Affect:  Mood normal.        Behavior: Behavior normal.        Thought Content: Thought content normal.        Judgment: Judgment normal.     Lab Results:  CBC    Component Value Date/Time   WBC 14.6 (H) 09/12/2021 1647   RBC 4.82 09/12/2021 1647   HGB 12.4 09/12/2021 1647   HGB 13.3 08/14/2021 1724   HCT 39.3 09/12/2021 1647   HCT 41.5 08/14/2021 1724   PLT 430.0 (H) 09/12/2021 1647   PLT 438 08/14/2021 1724   MCV 81.4 09/12/2021 1647   MCV 80 08/14/2021 1724   MCH 25.7 (L) 08/14/2021 1724   MCH 25.5 (L) 05/15/2021 1410   MCHC 31.6 09/12/2021 1647   RDW 15.7 (H) 09/12/2021 1647   RDW 14.7 08/14/2021 1724   LYMPHSABS 2.3 09/12/2021 1647   MONOABS 1.0 09/12/2021 1647   EOSABS 0.2 09/12/2021 1647   BASOSABS 0.0 09/12/2021 1647    BMET    Component Value Date/Time   NA 136 05/15/2021 1410   K 4.0 05/15/2021 1410   CL 103 05/15/2021 1410   CO2 22 05/15/2021 1410   GLUCOSE 81 05/15/2021 1410   BUN 11 05/15/2021 1410   CREATININE 0.73 05/15/2021 1410   CALCIUM 9.0 05/15/2021 1410   GFRNONAA >60 05/15/2021 1410   GFRAA >60 02/27/2018 0716    BNP No results found for: BNP  ProBNP No results found for: PROBNP  Imaging: No results found.   Assessment & Plan:   Asthma - Patient denies acute respiratory symptoms. She has productive cough at night with clear mucus. She had scattered wheezing on exam. She is compliant with Dulera two puffs q 12 hours. Sending in RX prednisone taper 40mg  x 2 days; 20mg  x 2 days; 10mg  x 2 days. Recommend she start taking mucinex twice daily for cough. Checking CXR. Fu in 3 months or sooner if needed.   Pleuritic chest  pain L post - Pain has resolved. Likely musculoskeletal. We are checking d-dimer/basic labs, if elevated needs CTA to rule out PE.   Insomnia - Patient is having a difficult time sleeping, she has been taking Trazodone 50mg  a bedtime without improvement. Recommend increasing Trazodone to 100mg  QHS prn insomnia. If not effective she should have sleep consult with one of our sleep doctors. Continue to follow with behavioral health/psychiatry.    Martyn Ehrich, NP 11/13/2021

## 2021-11-13 NOTE — Assessment & Plan Note (Signed)
-   Patient denies acute respiratory symptoms. She has productive cough at night with clear mucus. She had scattered wheezing on exam. She is compliant with Dulera two puffs q 12 hours. Sending in RX prednisone taper 40mg  x 2 days; 20mg  x 2 days; 10mg  x 2 days. Recommend she start taking mucinex twice daily for cough. Checking CXR. Fu in 3 months or sooner if needed.

## 2021-11-13 NOTE — Patient Instructions (Addendum)
We will call with CXR and lab results later today/tomorrow  Recommendations: Continue Dulera two puffs morning and evening Continue Singulair 10mg  at bedtime  Start prednisone as directed Increase trazodone to 100mg  at bedtime for insomnia   Rx: Prednisone taper as directed  Trazodone 100mg  - take 1 tablet at bedtime for insomnia   Follow-up: 3 months with Dr. or sooner if needed

## 2021-11-13 NOTE — Assessment & Plan Note (Addendum)
-   Patient is having a difficult time sleeping, she has been taking Trazodone 50mg  a bedtime without improvement. Recommend increasing Trazodone to 100mg  QHS prn insomnia. If not effective she should have sleep consult with one of our sleep doctors. Continue to follow with behavioral health/psychiatry.

## 2021-11-14 NOTE — Telephone Encounter (Signed)
RDW just refers to size of red blood cells, she was not anemic so this is not likely concerning. Eosinophils are related to asthma, elevated due to acute flare. May be helpful in managing in the future, if continues to have exacerbations of asthma symptoms consider discussing starting biologic medication with Dr. Sherene Sires

## 2021-11-20 ENCOUNTER — Ambulatory Visit: Payer: 59 | Admitting: Orthopedic Surgery

## 2021-11-24 ENCOUNTER — Other Ambulatory Visit: Payer: Self-pay | Admitting: Nurse Practitioner

## 2021-11-24 ENCOUNTER — Telehealth: Payer: Self-pay

## 2021-11-24 DIAGNOSIS — M5441 Lumbago with sciatica, right side: Secondary | ICD-10-CM

## 2021-11-24 MED ORDER — DICLOFENAC SODIUM 75 MG PO TBEC: 75.0000 mg | DELAYED_RELEASE_TABLET | Freq: Two times a day (BID) | ORAL | 0 refills | Status: DC

## 2021-11-24 NOTE — Telephone Encounter (Signed)
error 

## 2021-11-27 NOTE — Progress Notes (Signed)
Cardiology Office Note:   Date:  11/28/2021  NAME:  Tina Manning    MRN: [id number] DOB:  1991-05-21   PCP:  Rema Fendt, NP  Cardiologist:  None  Electrophysiologist:  None   Referring MD: Rema Fendt, NP   Chief Complaint  Patient presents with   Palpitations         History of Present Illness:   Tina Manning is a 30 y.o. female with a hx of anxiety, obesity, asthma who is being seen today for the evaluation of tachycardia at the request of Rema Fendt, NP.  She reports she was diagnosed with tachycardia 10 years ago by cardiologist in [place].  She has not followed with cardiology since that time.  She informs me her heart was beating normally and no arrhythmias were found.  She was placed on metoprolol and has not followed up.  She reports for the past month she has had episodes of palpitations and chest pain.  She describes rapid heartbeat sensation.  It can occur 2-3 times per week.  Symptoms can last 10 minutes.  They can be associated with stress and anxiety.  She also describes tightness in her chest.  Her chest symptoms appear to be triggered by anxiety as well.  She reports she does get short of breath.  Her weight is 318 pounds.  BMI 54.  She also reports she snores and is fatigued.  They would likely test her for sleep apnea.  She is prediabetic.  Blood pressure slightly elevated today.  No history of heart attack or stroke.  She does not smoke.  She does not drink alcohol or use drugs.  She reports she may drink 40 to 50 ounces of water per day.  She reports no excess caffeine consumption.  She describes no stimulant use.  TSH from primary care physician normal at 2.29.  Her most recent hemoglobin is 12.3.  She does have heavy menses but no anemia has been identified.  Her EKG in office demonstrates sinus tachycardia with a heart rate of 132.  No regular exercise is reported.  She reports her diet is poor.  Past Medical History: Past Medical History:   Diagnosis Date   Anemia    Asthma    Complication of anesthesia    Endometriosis    PONV (postoperative nausea and vomiting)    Tachycardia     Past Surgical History: Past Surgical History:  Procedure Laterality Date   CHOLECYSTECTOMY     OPEN REDUCTION INTERNAL FIXATION (ORIF) DISTAL RADIAL FRACTURE Right 05/15/2021   Procedure: OPEN REDUCTION INTERNAL FIXATION (ORIF)RIGHT  DISTAL RADIAL FRACTURE;  Surgeon: Cammy Copa, MD;  Location: [facility] OR;  Service: Orthopedics;  Laterality: Right;    Current Medications: Current Meds  Medication Sig   albuterol (VENTOLIN HFA) 108 (90 Base) MCG/ACT inhaler Inhale 1-2 puffs into the lungs every 6 (six) hours as needed for wheezing or shortness of breath.   atorvastatin (LIPITOR) 40 MG tablet TAKE 1 TABLET(40 MG) BY MOUTH DAILY   diclofenac (VOLTAREN) 75 MG EC tablet Take 1 tablet (75 mg total) by mouth 2 (two) times daily.   hydrOXYzine (VISTARIL) 50 MG capsule Take 1 capsule (50 mg total) by mouth every 8 (eight) hours as needed.   ibuprofen (ADVIL) 600 MG tablet Take 1 tablet (600 mg total) by mouth every 8 (eight) hours as needed.   ipratropium-albuterol (DUONEB) 0.5-2.5 (3) MG/3ML SOLN Take 3 mLs by nebulization in the morning, at noon,  in the evening, and at bedtime.   metoprolol succinate (TOPROL-XL) 25 MG 24 hr tablet TAKE 1 TABLET(25 MG) BY MOUTH DAILY   mometasone-formoterol (DULERA) 200-5 MCG/ACT AERO Take 2 puffs first thing in am and then another 2 puffs about 12 hours later.   montelukast (SINGULAIR) 10 MG tablet Take 1 tablet (10 mg total) by mouth at bedtime.   pantoprazole (PROTONIX) 40 MG tablet Take 1 tablet (40 mg total) by mouth daily.   traZODone (DESYREL) 100 MG tablet Take 1 tablet (100 mg total) by mouth at bedtime as needed for sleep.     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  Other Topics Concern   Not on file  Social History Narrative   Not on file   Social Determinants of Health   Financial Resource Strain: Not on file  Food Insecurity: Not on file  Transportation Needs: Not on file  Physical Activity: Not on file  Stress: Not on file  Social Connections: Not on file     Family History: The patient's family history includes Breast cancer in her mother; Heart attack in her mother; Heart disease in her father; Hypertension in her father and mother; Liver cancer in her father; Polycystic ovary syndrome in her mother.  ROS:   All other ROS reviewed and negative. Pertinent positives noted in the HPI.     EKGs/Labs/Other Studies Reviewed:   The following studies were personally reviewed by me today:  EKG:  EKG is ordered today.  The ekg ordered today demonstrates sinus tachycardia heart rate 132, no acute ischemic changes or evidence of infarction, and was personally reviewed by me.   Recent Labs: 08/14/2021: ALT 24; TSH 2.290 11/13/2021: BUN 11; Creatinine, Ser 0.80; Hemoglobin 12.3; Platelets 393.0; Potassium 3.8; Sodium 137   Recent Lipid Panel    Component Value Date/Time   CHOL 211 (H) 08/14/2021 1724   TRIG 188 (H) 08/14/2021 1724   HDL 38 (L) 08/14/2021 1724   CHOLHDL 5.6 (H) 08/14/2021 1724   LDLCALC 139 (H) 08/14/2021 1724    Physical Exam:   VS:  BP (!) 142/90   Pulse (!) 132   Ht 5\' 4"  (1.626 m)   Wt (!) 318 lb (144.2 kg)   SpO2 99%   BMI 54.58 kg/m    Wt Readings from Last 3 Encounters:  11/28/21 (!) 318 lb (144.2 kg)  11/13/21 (!) 310 lb 3.2 oz (140.7 kg)   11/07/21 (!) 310 lb 3.2 oz (140.7 kg)    General: Well nourished, well developed, in no acute distress Head: Atraumatic, normal size  Eyes: PEERLA, EOMI  Neck: Supple, no JVD Endocrine: No thryomegaly Cardiac: Normal S1, S2; RRR; no murmurs, rubs, or gallops Lungs: Clear to auscultation bilaterally, no wheezing, rhonchi or rales  Abd: Soft, nontender, no hepatomegaly  Ext: No edema, pulses 2+ Musculoskeletal: No deformities, BUE and BLE strength normal and equal Skin: Warm and  dry, no rashes   Neuro: Alert and oriented to person, place, time, and situation, CNII-XII grossly intact, no focal deficits  Psych: Normal mood and affect   ASSESSMENT:   Tina Manning is a 30 y.o. female who presents for the following: 1. Tachycardia   2. Chest pain of uncertain etiology   3. Obesity, morbid, BMI 50 or higher (HCC)     PLAN:   1. Tachycardia -She describes tachycardia and palpitations over the last 1 month.  EKG in office demonstrates sinus tachycardia.  Apparently her tachycardia has been a chronic issue for a long time.  She was evaluated by cardiology 10 years ago.  Work-up there was unremarkable and she was placed on metoprolol.  Her symptoms of palpitations appear to occur with anxiety and stress.  Most recent TSH is normal.  She is not anemic with a normal hemoglobin value reported recently at her PCP office.  I suspect dehydration could be contributing.  I encouraged her to drink more water.  She is only drinking 40 to 50 ounces per day.  I recommended 80-100.  She also describes snoring and fatigue and will be tested for sleep apnea.  Sleep apnea clearly contributing to her tachycardia.  I also believe deconditioning is a big issue here.  Her BMI is 54.  She is on active.  I have encouraged her to exercise more as this will help.  She describes no caffeine consumption. -We will pursue a 7-day Zio patch to exclude arrhythmia given change in symptoms.  However I suspect this is just  multifactorial in setting of dehydration, morbid obesity/deconditioning and untreated sleep apnea.  For now is okay to continue her metoprolol. -We will see her back as needed.  If her monitor is unremarkable she will just pursue a conservative approach.  2. Chest pain of uncertain etiology -She describes chest pain associated with stress and anxiety.  This is noncardiac chest pain.  Given her young age I see no need for further work-up.  3. Obesity, morbid, BMI 50 or higher (HCC) -Diet and exercise were recommended.  Disposition: Return if symptoms worsen or fail to improve.  Medication Adjustments/Labs and Tests Ordered: Current medicines are reviewed at length with the patient today.  Concerns regarding medicines are outlined above.  Orders Placed This Encounter  Procedures   LONG TERM MONITOR (3-14 DAYS)   EKG 12-Lead    No orders of the defined types were placed in this encounter.   Patient Instructions  Medication Instructions:  The current medical regimen is effective;  continue present plan and medications.  *If you need a refill on your cardiac medications before your next appointment, please call your pharmacy*   Testing/Procedures: ZIO XT- Long Term Monitor Instructions  Your physician has requested you wear a ZIO patch monitor for 7 days.  This is a single patch monitor. [employer] supplies one patch monitor per enrollment. Additional stickers are not available. Please do not apply patch if you will be having a Nuclear Stress Test,  Echocardiogram, Cardiac CT, MRI, or Chest Xray during the period you would be wearing the  monitor. The patch cannot be worn during these tests. You cannot remove and re-apply the  ZIO XT patch monitor.  Your ZIO patch monitor will be mailed 3 day USPS to your address on file. It may take 3-5 days  to receive your monitor after you have been enrolled.  Once you have received your monitor, please review the enclosed instructions. Your  monitor  has already been registered assigning a specific monitor serial # to you.  Billing and Patient Assistance Program Information  We have supplied [employer] with any of your insurance information on file for billing purposes. [employer] offers a sliding scale Patient Assistance Program for patients that do not have  insurance, or whose insurance does not completely cover the cost of the ZIO monitor.  You must apply for the Patient Assistance Program to qualify for this discounted rate.  To apply, please call [employer] at [phone], select option 4, select option 2, ask to apply for  Patient Assistance Program. Meredeth Ide will ask your household income, and how many people  are in your household. They will quote your out-of-pocket cost based on that information.  [employer] will also be able to set up a 109-month, interest-free payment plan if needed.  Applying the monitor   Shave hair from upper left chest.  Hold abrader disc by orange tab. Rub abrader in 40 strokes over the upper left chest as  indicated in your monitor instructions.  Clean area with 4 enclosed alcohol pads. Let dry.  Apply patch as indicated in monitor instructions. Patch will be placed under collarbone on left  side of chest with arrow pointing upward.  Rub patch adhesive wings for 2 minutes. Remove white label marked "1". Remove the white  label marked "2". Rub patch adhesive wings for 2 additional minutes.  While looking in a mirror, press and release button in center of patch. A small green light will  flash 3-4 times. This will be your only indicator that the monitor has been turned on.  Do not shower for the first 24 hours. You may shower after the first 24 hours.  Press the button if you feel a symptom. You will hear a small click. Record Date, Time and  Symptom in the Patient Logbook.  When you are ready to remove the patch, follow instructions on the last 2 pages of Patient  Logbook. Stick patch monitor onto the  last page of Patient Logbook.  Place Patient Logbook in the blue and white box. Use locking tab on box and tape box closed  securely. The blue and white box has prepaid postage on it. Please place it in the mailbox as  soon as possible. Your physician should have your test results approximately 7 days after the  monitor has been mailed back to [facility].  Call [facility] Customer Care at [phone] if you have questions regarding  your ZIO XT patch monitor. Call them immediately if you see an orange light blinking on your  monitor.  If your monitor falls off in less than 4 days, contact our Monitor department at [phone].  If your monitor becomes loose or falls off after 4 days call [employer] at [phone] for  suggestions on securing your monitor    Follow-Up: At [facility], you and your health needs are our priority.  As part of our continuing mission to provide you with exceptional heart care, we have created designated Provider Care Teams.  These Care Teams include your primary Cardiologist (physician) and Advanced Practice Providers (APPs -  Physician Assistants and Nurse Practitioners) who all work together to provide you with the care you need, when you need it.  We recommend signing up for the patient portal called "MyChart".  Sign up information is provided on this After Visit Summary.  MyChart is used to connect with patients for Virtual Visits (Telemedicine).  Patients are able to view lab/test results, encounter  notes, upcoming appointments, etc.  Non-urgent messages can be sent to your provider as well.   To learn more about what you can do with MyChart, go to [email or web address].    Your next appointment:   As needed  The format for your next appointment:   In Person  Provider:   Lennie Odor, MD      Signed, Lenna Gilford. Flora Lipps, MD, [facility]  [facility]  [address] [place], [place] [address] [phone]   11/28/2021 2:52 PM

## 2021-11-28 ENCOUNTER — Encounter: Payer: Self-pay | Admitting: Cardiovascular Disease

## 2021-11-28 ENCOUNTER — Ambulatory Visit (INDEPENDENT_AMBULATORY_CARE_PROVIDER_SITE_OTHER): Payer: 59

## 2021-11-28 ENCOUNTER — Other Ambulatory Visit: Payer: Self-pay

## 2021-11-28 ENCOUNTER — Ambulatory Visit (INDEPENDENT_AMBULATORY_CARE_PROVIDER_SITE_OTHER): Payer: 59 | Admitting: Cardiovascular Disease

## 2021-11-28 ENCOUNTER — Other Ambulatory Visit: Payer: 59

## 2021-11-28 VITALS — BP 142/90 | HR 132 | Ht 64.0 in | Wt 318.0 lb

## 2021-11-28 DIAGNOSIS — R000 Tachycardia, unspecified: Secondary | ICD-10-CM | POA: Diagnosis not present

## 2021-11-28 DIAGNOSIS — M5441 Lumbago with sciatica, right side: Secondary | ICD-10-CM | POA: Diagnosis not present

## 2021-11-28 DIAGNOSIS — R079 Chest pain, unspecified: Secondary | ICD-10-CM

## 2021-11-28 NOTE — Patient Instructions (Signed)
Medication Instructions:  The current medical regimen is effective;  continue present plan and medications.  *If you need a refill on your cardiac medications before your next appointment, please call your pharmacy*   Testing/Procedures:  ZIO XT- Long Term Monitor Instructions  Your physician has requested you wear a ZIO patch monitor for 7 days.  This is a single patch monitor. [employer] supplies one patch monitor per enrollment. Additional stickers are not available. Please do not apply patch if you will be having a Nuclear Stress Test,  Echocardiogram, Cardiac CT, MRI, or Chest Xray during the period you would be wearing the  monitor. The patch cannot be worn during these tests. You cannot remove and re-apply the  ZIO XT patch monitor.  Your ZIO patch monitor will be mailed 3 day USPS to your address on file. It may take 3-5 days  to receive your monitor after you have been enrolled.  Once you have received your monitor, please review the enclosed instructions. Your monitor  has already been registered assigning a specific monitor serial # to you.  Billing and Patient Assistance Program Information  We have supplied [employer] with any of your insurance information on file for billing purposes. [employer] offers a sliding scale Patient Assistance Program for patients that do not have  insurance, or whose insurance does not completely cover the cost of the ZIO monitor.  You must apply for the Patient Assistance Program to qualify for this discounted rate.  To apply, please call [employer] at [phone], select option 4, select option 2, ask to apply for  Patient Assistance Program. [employer] will ask your household income, and how many people  are in your household. They will quote your out-of-pocket cost based on that information.  [employer] will also be able to set up a 12-month, interest-free payment plan if needed.  Applying the monitor   Shave hair from upper left chest.  Hold abrader  disc by orange tab. Rub abrader in 40 strokes over the upper left chest as  indicated in your monitor instructions.  Clean area with 4 enclosed alcohol pads. Let dry.  Apply patch as indicated in monitor instructions. Patch will be placed under collarbone on left  side of chest with arrow pointing upward.  Rub patch adhesive wings for 2 minutes. Remove white label marked "1". Remove the white  label marked "2". Rub patch adhesive wings for 2 additional minutes.  While looking in a mirror, press and release button in center of patch. A small green light will  flash 3-4 times. This will be your only indicator that the monitor has been turned on.  Do not shower for the first 24 hours. You may shower after the first 24 hours.  Press the button if you feel a symptom. You will hear a small click. Record Date, Time and  Symptom in the Patient Logbook.  When you are ready to remove the patch, follow instructions on the last 2 pages of Patient  Logbook. Stick patch monitor onto the last page of Patient Logbook.  Place Patient Logbook in the blue and white box. Use locking tab on box and tape box closed  securely. The blue and white box has prepaid postage on it. Please place it in the mailbox as  soon as possible. Your physician should have your test results approximately 7 days after the  monitor has been mailed back to [employer].  Call [employer] Customer Care at [phone] if you have questions regarding  your ZIO   XT patch monitor. Call them immediately if you see an orange light blinking on your  monitor.  If your monitor falls off in less than 4 days, contact our Monitor department at [phone].  If your monitor becomes loose or falls off after 4 days call [employer] at [phone] for  suggestions on securing your monitor    Follow-Up: At [facility], you and your health needs are our priority.  As part of our continuing mission to provide you with exceptional heart care, we  have created designated Provider Care Teams.  These Care Teams include your primary Cardiologist (physician) and Advanced Practice Providers (APPs -  Physician Assistants and Nurse Practitioners) who all work together to provide you with the care you need, when you need it.  We recommend signing up for the patient portal called "MyChart".  Sign up information is provided on this After Visit Summary.  MyChart is used to connect with patients for Virtual Visits (Telemedicine).  Patients are able to view lab/test results, encounter notes, upcoming appointments, etc.  Non-urgent messages can be sent to your provider as well.   To learn more about what you can do with MyChart, go to [email or web address].    Your next appointment:   As needed  The format for your next appointment:   In Person  Provider:   [place] Simeon O'Neal, MD          

## 2021-11-28 NOTE — Progress Notes (Unsigned)
Enrolled patient for a 7 day Zio XT to be mailed to patients home  

## 2021-11-29 ENCOUNTER — Encounter: Payer: Self-pay | Admitting: Family

## 2021-11-29 NOTE — Telephone Encounter (Signed)
Provider has spoken to patient about xray

## 2021-12-01 ENCOUNTER — Other Ambulatory Visit: Payer: Self-pay | Admitting: Pulmonary Disease

## 2021-12-01 MED ORDER — ESZOPICLONE 2 MG PO TABS: 2.0000 mg | ORAL_TABLET | Freq: Every evening | ORAL | 2 refills | Status: DC | PRN

## 2021-12-01 NOTE — Telephone Encounter (Signed)
EW is out of the office.  Will route to AO for advice.  Thanks    I know you upped my sleeping medicine last I saw you but it's not working at all I'm on day two with maybe a hour to sleep then I wake up it's starting to affect me at work is there anything to help I've tried melatonin,  benadryl,  zzquil nothing is helping me

## 2021-12-01 NOTE — Progress Notes (Signed)
Prescription for Lunesta sent into pharmacy 

## 2021-12-01 NOTE — Telephone Encounter (Signed)
Prescription for Lunesta 2 mg sent into pharmacy  If not covered or too expensive then a prescription for Ambien 10 mg can be tried

## 2021-12-02 DIAGNOSIS — R000 Tachycardia, unspecified: Secondary | ICD-10-CM | POA: Diagnosis not present

## 2021-12-04 ENCOUNTER — Ambulatory Visit (HOSPITAL_COMMUNITY): Payer: 59 | Admitting: Licensed Clinical Social Worker

## 2021-12-05 ENCOUNTER — Ambulatory Visit (INDEPENDENT_AMBULATORY_CARE_PROVIDER_SITE_OTHER): Payer: 59 | Admitting: Orthopaedic Surgery

## 2021-12-05 ENCOUNTER — Telehealth (HOSPITAL_COMMUNITY): Payer: Self-pay | Admitting: Psychiatry

## 2021-12-05 ENCOUNTER — Encounter (HOSPITAL_COMMUNITY): Payer: Self-pay | Admitting: Psychiatry

## 2021-12-05 ENCOUNTER — Ambulatory Visit (INDEPENDENT_AMBULATORY_CARE_PROVIDER_SITE_OTHER): Payer: 59 | Admitting: Primary Care

## 2021-12-05 ENCOUNTER — Other Ambulatory Visit: Payer: Self-pay

## 2021-12-05 ENCOUNTER — Encounter: Payer: Self-pay | Admitting: Orthopaedic Surgery

## 2021-12-05 ENCOUNTER — Telehealth (INDEPENDENT_AMBULATORY_CARE_PROVIDER_SITE_OTHER): Payer: 59 | Admitting: Psychiatry

## 2021-12-05 ENCOUNTER — Encounter: Payer: Self-pay | Admitting: Primary Care

## 2021-12-05 ENCOUNTER — Other Ambulatory Visit (HOSPITAL_COMMUNITY): Payer: Self-pay

## 2021-12-05 VITALS — Ht 65.0 in | Wt 314.0 lb

## 2021-12-05 VITALS — BP 120/100 | HR 125 | Temp 98.4°F | Ht 65.0 in | Wt 314.2 lb

## 2021-12-05 DIAGNOSIS — M5441 Lumbago with sciatica, right side: Secondary | ICD-10-CM

## 2021-12-05 DIAGNOSIS — M5431 Sciatica, right side: Secondary | ICD-10-CM | POA: Diagnosis not present

## 2021-12-05 DIAGNOSIS — F410 Panic disorder [episodic paroxysmal anxiety] without agoraphobia: Secondary | ICD-10-CM

## 2021-12-05 DIAGNOSIS — G4700 Insomnia, unspecified: Secondary | ICD-10-CM

## 2021-12-05 DIAGNOSIS — R0683 Snoring: Secondary | ICD-10-CM

## 2021-12-05 DIAGNOSIS — F331 Major depressive disorder, recurrent, moderate: Secondary | ICD-10-CM

## 2021-12-05 DIAGNOSIS — J45909 Unspecified asthma, uncomplicated: Secondary | ICD-10-CM | POA: Diagnosis not present

## 2021-12-05 DIAGNOSIS — F5102 Adjustment insomnia: Secondary | ICD-10-CM | POA: Diagnosis not present

## 2021-12-05 DIAGNOSIS — F411 Generalized anxiety disorder: Secondary | ICD-10-CM

## 2021-12-05 MED ORDER — SERTRALINE HCL 100 MG PO TABS: 100.0000 mg | ORAL_TABLET | Freq: Every day | ORAL | 0 refills | Status: DC

## 2021-12-05 MED ORDER — BUSPIRONE HCL 7.5 MG PO TABS: 7.5000 mg | ORAL_TABLET | Freq: Every day | ORAL | 0 refills | Status: DC

## 2021-12-05 NOTE — Telephone Encounter (Signed)
I left vm informing patient that she can get it at retail price of $4 at any [facility]. I asked for her to let us know if she wants it sent to [facility] or if she wants Dr. Lynnae Sandhoff to think about another medication to send to [facility]

## 2021-12-05 NOTE — Patient Instructions (Addendum)
Continue Dulera 2 puffs morning and evening  Continue Singulair 10mg  at bedtime   If continuing to have flare up's of your asthma next step would be to consider starting you on biologic like fasenra or dupixent injections  If still having difficulty with sleep we can increase Lunesta to 3mg  or change to Ambien 10mg    Follow up with cardiology, ask about increasing metoprolol d.t heart rate   Follow-up: 2-3 months with Dr. or sooner if needed

## 2021-12-05 NOTE — Addendum Note (Signed)
Addended by: Wendi Maya on: 12/05/2021 01:49 PM   Modules accepted: Orders

## 2021-12-05 NOTE — Telephone Encounter (Signed)
Patient wanted Buspar sent to [facility] in [place]. Resent meds to [facility]

## 2021-12-05 NOTE — Assessment & Plan Note (Signed)
-   Difficulty falling and staying asleep. She has tried and failed melatonin, benadryl, Zzquil and trazodone 100mg . Recently started on Lunesta 2 mg with mild improvement. If still having symptoms of insomnia we can increase Lunesta to 3mg  or change to Ambien 10mg . Advised she also speak with behavioral therapist about sleep hygiene.

## 2021-12-05 NOTE — Progress Notes (Signed)
Office Visit Note   Patient: Tina Manning           Date of Birth: July 08, 1991           MRN: [id number] Visit Date: 12/05/2021              Requested by: Camillia Herter, NP [address] [place],  [place] [address] PCP: Camillia Herter, NP   Assessment & Plan: Visit Diagnoses:  1. Acute right-sided low back pain with right-sided sciatica   2. Sciatic nerve pain, right     Plan: Tina Manning relates a 4 to 6-week history of low back pain with some referred discomfort into her right lower extremity.  Her primary care physician has evaluated her and placed her on Robaxin, Voltaren and initially prednisone.  Tina Manning relates that it has not made much of a difference.  She does stand over the course of her workday and has some discomfort and also when she sits for long period of time.  She denies any bowel or bladder dysfunction.  Her exam was relatively benign without evidence of neurologic compromise.  Her x-rays (performed by her primary care physician on 12 6) demonstrate significant narrowing at the L5-S1 disc space.  There might be very minimal anterior listhesis of L5 on S1.  I would like to try a course of physical therapy in [place] where she lives and reevaluate in a month.  If no improvement I think we should obtain an MRI scan.  I will like to check her back in a month or sooner if she has any change.  May continue with her over-the-counter medicines  Follow-Up Instructions: Return in about 1 month (around 01/05/2022).   Orders:  No orders of the defined types were placed in this encounter.  No orders of the defined types were placed in this encounter.     Procedures: No procedures performed   Clinical Data: No additional findings.   Subjective: Chief Complaint  Patient presents with   Lower Back - Pain  Patient presents today for right lower back pain. She said that the pain started a couple month ago. No known injury. She has been experiencing a  stabbing sensation in her right buttock, along with numbness in her right thigh. She said that the pain is worse with standing. She has difficulty with sleeping. She has seen her PCP and had x-rays. She has tried multiple medicines, such as over the counter meds, oral prednisone, Robaxin, and Diclofenac. She said that nothing has helped with her pain.   HPI  Review of Systems   Objective: Vital Signs: Ht 5\' 5"  (1.651 m)    Wt (!) 314 lb (142.4 kg)    BMI 52.25 kg/m   Physical Exam Constitutional:      Appearance: She is well-developed.  Eyes:     Pupils: Pupils are equal, round, and reactive to light.  Pulmonary:     Effort: Pulmonary effort is normal.  Skin:    General: Skin is warm and dry.  Neurological:     Mental Status: She is alert and oriented to person, place, and time.  Psychiatric:        Behavior: Behavior normal.    Ortho Exam awake alert and oriented x3.  Comfortable sitting.  BMI 52 leg raise is negative bilaterally.  Reflexes were symmetrical.  Normal sensation of both lower extremities and normal strength.  There was some mild percussible tenderness along the lower lumbar spine  at the lumbosacral junction.  No pain over either SI joint.  Painless range of motion both hips and knees.  Specialty Comments:  No specialty comments available.  Imaging: No results found.   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   Hyperlipidemia 08/15/2021   Prediabetes 08/15/2021   Morbid obesity due to excess calories (HCC) 08/15/2021   Closed fracture of lower end of right radius with routine healing    Anxiety and depression 08/09/2011   Endometriosis 03/27/2009   Tachycardia  11/27/1998   Asthma 04/24/1994   Past Medical History:  Diagnosis Date   Anemia    Asthma    Complication of anesthesia    Endometriosis    PONV (postoperative nausea and vomiting)    Tachycardia     Family History  Problem Relation Age of Onset   Heart attack Mother    Hypertension Mother    Breast cancer Mother    Polycystic ovary syndrome Mother    Heart disease Father    Hypertension Father    Liver cancer Father     Past Surgical History:  Procedure Laterality Date   CHOLECYSTECTOMY     OPEN REDUCTION INTERNAL FIXATION (ORIF) DISTAL RADIAL FRACTURE Right 05/15/2021   Procedure: OPEN REDUCTION INTERNAL FIXATION (ORIF)RIGHT  DISTAL RADIAL FRACTURE;  Surgeon: Cammy Copa, MD;  Location: [facility] OR;  Service: Orthopedics;  Laterality: Right;   Social History   Occupational History   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

## 2021-12-05 NOTE — Telephone Encounter (Signed)
Pt called to say the new medication that was prescribed - her insurance will not cover it and it costs too much out of pocket for her to pay.

## 2021-12-05 NOTE — Assessment & Plan Note (Addendum)
-   Stable interval; Shortness of breath has improved. She is compliant with Dulera 2 puffs BID and Singulair. Scattered wheezing on exam. Eos 0.6, if she continues to have exacerbations recommend adding biologic therapy.
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
## 2021-12-05 NOTE — Telephone Encounter (Signed)
Pt called back stating that [facility] told her the only dosages of Buspar that is $4 is the 5mg  and the 10mg . She would a new medication sent to [facility] in [place] or a new dose sent to the [facility] in [place]. Please advisie.   CB# [phone]

## 2021-12-05 NOTE — Assessment & Plan Note (Addendum)
-   At risk for sleep apnea d/t obesity. She has symptoms of insomnia, disruptive sleep, snoring, tachycardia. Recommend getting home sleep study to evaluate for OSA. Patient agreeing with testing.

## 2021-12-05 NOTE — Progress Notes (Signed)
@Patient  ID: Tina Manning, female    DOB: [birth year], 30 y.o.   MRN: [id number]  Chief Complaint  Patient presents with   Follow-up    Referring provider: Camillia Herter, NP  HPI:  30 year female, never smoked. PMH significant for asthma, obesity,  hyperlipidemia, prediabetes. Patient of Dr. Melvyn Novas, last seen on 11/07/21.   Previous [facility] pulmonary encounter: 11/07/2021  f/u ov/[facility] re: chronic asthma  maint on dulera  200 bid due to ha      Chief Complaint  Patient presents with   Follow-up      Upper left back pain with inspiration last pm.  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      Also denies any obvious fluctuation of symptoms with weather or environmental changes or other aggravating or alleviating factors except as outlined above    No unusual exposure hx or h/o childhood pna/   knowledge of premature birth.  The abrupt onset of L pleuritic cp s cough or MS injury to chest or shoulder along with sinus Tach are all worrisome for PE > advised to go directly to ER but wants to pick up husband first and "call us back if condition worsened"  I advised against this and feel PE is dx of exclusion 1st and explained why to pt.   F/u can be in 6 weeks once we sort out her acute problem preferably today.   11/13/2021 Patient presents today for acute follow-up. She was seen on 11/15 for pleuritic chest pain and told she needed to go to ED, however, she declined. Dr. Melvyn Novas  recommended chest imaging and labs, these have not been ordered or completed. We will get these today.   She is no longer experiencing pleuritic chest pain. Pain lasted for 2 days and went away. 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 She is dealing with some life stress and is following with both behavioral health and psychiatry. She is taking Dulera two puffs morning and evening. She has a productive cough with clear mucus at night. She denies shortness of breath.   12/05/2021 - Interim hx  Patient presents today for follow-up. She is some doing better. Shortness of breath is not as bad. She still has some chest congestion. Her blood pressure remains elevated and she continues to have episodes of tachycardia, she is seeing cardiology and currently wearing heart monitor. She is maintained on Dulera and Singulair. During our last visit we increased Trazodone to 100mg  at bedtime for insomnia.  She called our office on 12/01/21 with reports of inability to sleep. Dr. Ander Slade sent in prescription for Lunesta 2mg . Continue to follow with behavioral health/psychiatry.     Allergies  Allergen Reactions   Bee Venom Anaphylaxis   Ketorolac Tromethamine  Palpitations and Shortness Of Breath   Penicillins Other (See Comments)    Intolerance     Immunization History  Administered Date(s) Administered   Influenza,inj,Quad PF,6+ Mos 11/07/2021   PFIZER(Purple Top)SARS-COV-2 Vaccination 01/27/2020, 02/25/2020    Past Medical History:  Diagnosis Date   Anemia    Asthma    Complication of anesthesia    Endometriosis    PONV (postoperative nausea and vomiting)    Tachycardia     Tobacco History: Social History   Tobacco Use  Smoking Status Never  Smokeless Tobacco Never   Counseling given: Not Answered   Outpatient Medications Prior to Visit  Medication Sig Dispense Refill   albuterol  (VENTOLIN HFA) 108 (90 Base) MCG/ACT inhaler Inhale 1-2 puffs into the lungs every 6 (six) hours as needed for wheezing or shortness of breath. 8 g 2   atorvastatin (LIPITOR) 40 MG tablet TAKE 1 TABLET(40 MG) BY MOUTH DAILY 90 tablet 0   eszopiclone (LUNESTA) 2 MG TABS tablet Take 1 tablet (2 mg total) by mouth at bedtime as needed for sleep. Take immediately before bedtime 30 tablet 2   hydrOXYzine (VISTARIL) 50 MG capsule Take 1 capsule (50 mg total) by mouth every 8 (eight) hours as needed. 30 capsule 0   ibuprofen (ADVIL) 600 MG tablet Take 1 tablet (600 mg total) by mouth every 8 (eight) hours as needed. 30 tablet 0   ipratropium-albuterol (DUONEB) 0.5-2.5 (3) MG/3ML SOLN Take 3 mLs by nebulization in the morning, at noon, in the evening, and at bedtime.     metoprolol succinate (TOPROL-XL) 25 MG 24 hr tablet TAKE 1 TABLET(25 MG) BY MOUTH DAILY 90 tablet 0   mometasone-formoterol (DULERA) 200-5 MCG/ACT AERO Take 2 puffs first thing in am and then another 2 puffs about 12 hours later. 1 each 11   montelukast (SINGULAIR) 10 MG tablet Take 1 tablet (10 mg total) by mouth at bedtime. 30 tablet 11   pantoprazole (PROTONIX) 40 MG tablet Take 1 tablet (40 mg total) by mouth daily. 30 tablet 2   diclofenac (VOLTAREN) 75 MG EC tablet Take 1 tablet (75 mg total) by mouth 2 (two) times daily. 30 tablet 0   traZODone (DESYREL) 100 MG tablet Take 1 tablet (100 mg total) by mouth at bedtime as needed for sleep. 30 tablet 1   No facility-administered medications prior to visit.    Review of Systems  Review of Systems  Constitutional:  Positive for fatigue.  HENT:  Positive for congestion.   Respiratory:  Negative for cough, chest tightness, shortness of breath and wheezing.   Psychiatric/Behavioral:  Positive for sleep disturbance.     Physical Exam  BP (!) 120/100 (BP Location: Left Wrist, Cuff Size: Large)   Pulse (!) 125   Temp 98.4 F (36.9 C) (Oral)   Ht 5\' 5"  (1.651 m)   Wt (!) 314 lb 3.2  oz (142.5 kg)   SpO2 98%   BMI 52.29 kg/m  Physical Exam Constitutional:      General: She is not in acute distress.    Appearance: Normal appearance. She is not ill-appearing.  HENT:     Head: Normocephalic and atraumatic.  Cardiovascular:     Rate and Rhythm: Regular rhythm. Tachycardia present.  Pulmonary:     Effort: Pulmonary effort is normal.     Breath sounds: Wheezing present. No rhonchi or rales.  Musculoskeletal:        General: Normal range of motion.  Skin:    General: Skin  is warm.  Neurological:     General: No focal deficit present.     Mental Status: She is alert and oriented to person, place, and time. Mental status is at baseline.  Psychiatric:        Mood and Affect: Mood normal.        Behavior: Behavior normal.        Thought Content: Thought content normal.        Judgment: Judgment normal.     Lab Results:  CBC    Component Value Date/Time   WBC 10.3 11/13/2021 0949   RBC 4.75 11/13/2021 0949   HGB 12.3 11/13/2021 0949   HGB 13.3 08/14/2021 1724   HCT 38.8 11/13/2021 0949   HCT 41.5 08/14/2021 1724   PLT 393.0 11/13/2021 0949   PLT 438 08/14/2021 1724   MCV 81.8 11/13/2021 0949   MCV 80 08/14/2021 1724   MCH 25.7 (L) 08/14/2021 1724   MCH 25.5 (L) 05/15/2021 1410   MCHC 31.8 11/13/2021 0949   RDW 16.5 (H) 11/13/2021 0949   RDW 14.7 08/14/2021 1724   LYMPHSABS 1.9 11/13/2021 0949   MONOABS 0.7 11/13/2021 0949   EOSABS 0.6 11/13/2021 0949   BASOSABS 0.1 11/13/2021 0949    BMET    Component Value Date/Time   NA 137 11/13/2021 0949   K 3.8 11/13/2021 0949   CL 107 11/13/2021 0949   CO2 22 11/13/2021 0949   GLUCOSE 96 11/13/2021 0949   BUN 11 11/13/2021 0949   CREATININE 0.80 11/13/2021 0949   CALCIUM 8.5 11/13/2021 0949   GFRNONAA >60 05/15/2021 1410   GFRAA >60 02/27/2018 0716    BNP No results found for: BNP  ProBNP No results found for: PROBNP  Imaging: DG Chest 2 View  Result Date: 11/13/2021 CLINICAL DATA:   Shortness of breath EXAM: CHEST - 2 VIEW COMPARISON:  Chest x-ray 09/12/2021 FINDINGS: Heart size and mediastinal contours are within normal limits. No suspicious pulmonary opacities identified. No pleural effusion or pneumothorax visualized. No acute osseous abnormality appreciated. IMPRESSION: No acute intrathoracic process identified. Electronically Signed   By: Jannifer Hick M.D.   On: 11/13/2021 10:31   DG Lumbar Spine 2-3 Views  Result Date: 11/28/2021 CLINICAL DATA:  Low back pain with sciatica. EXAM: LUMBAR SPINE - 2-3 VIEW COMPARISON:  None. FINDINGS: There is no evidence of lumbar spine fracture. Alignment is normal. There is mild disc space narrowing at L5-S1. Disc spaces are otherwise well maintained. No focal osseous lesion. Soft tissues are within normal limits. Cholecystectomy clips are present. IMPRESSION: Mild degenerative disc space narrowing at L5-S1. Electronically Signed   By: Darliss Cheney M.D.   On: 11/28/2021 23:56     Assessment & Plan:   Asthma - Stable interval; Shortness of breath has improved. She is compliant with Dulera 2 puffs BID and Singulair. Scattered wheezing on exam. Eos 0.6, if she continues to have exacerbations recommend adding biologic therapy.   Insomnia - Difficulty falling and staying asleep. She has tried and failed melatonin, benadryl, Zzquil and trazodone 100mg . Recently started on Lunesta 2 mg with mild improvement. If still having symptoms of insomnia we can increase Lunesta to 3mg  or change to Ambien 10mg . Advised she also speak with behavioral therapist about sleep hygiene.   Snoring - At risk for sleep apnea d/t obesity. She has symptoms of insomnia, disruptive sleep, snoring, tachycardia. Recommend getting home sleep study to evaluate for OSA. Patient agreeing with testing.    ,  NP 12/05/2021

## 2021-12-06 ENCOUNTER — Encounter: Payer: Self-pay | Admitting: Orthopaedic Surgery

## 2021-12-06 MED ORDER — BUSPIRONE HCL 5 MG PO TABS: 5.0000 mg | ORAL_TABLET | Freq: Every day | ORAL | 0 refills | Status: DC

## 2021-12-06 NOTE — Addendum Note (Signed)
Addended by: Azalia Bilis on: 12/06/2021 08:07 AM   Modules accepted: Orders

## 2021-12-06 NOTE — Telephone Encounter (Signed)
5mg  sent. Will notify pt

## 2021-12-08 ENCOUNTER — Other Ambulatory Visit: Payer: Self-pay | Admitting: Pulmonary Disease

## 2021-12-08 MED ORDER — ZOLPIDEM TARTRATE ER 12.5 MG PO TBCR: 12.5000 mg | EXTENDED_RELEASE_TABLET | Freq: Every evening | ORAL | 1 refills | Status: DC | PRN

## 2021-12-08 NOTE — Telephone Encounter (Signed)
Will place prescription for Ambien

## 2021-12-14 ENCOUNTER — Ambulatory Visit (INDEPENDENT_AMBULATORY_CARE_PROVIDER_SITE_OTHER): Payer: 59 | Admitting: Family Medicine

## 2021-12-14 ENCOUNTER — Other Ambulatory Visit: Payer: Self-pay

## 2021-12-14 ENCOUNTER — Encounter: Payer: Self-pay | Admitting: Family Medicine

## 2021-12-14 VITALS — BP 151/99 | HR 118 | Ht 65.0 in | Wt 315.0 lb

## 2021-12-14 DIAGNOSIS — N939 Abnormal uterine and vaginal bleeding, unspecified: Secondary | ICD-10-CM

## 2021-12-14 DIAGNOSIS — R87610 Atypical squamous cells of undetermined significance on cytologic smear of cervix (ASC-US): Secondary | ICD-10-CM | POA: Diagnosis not present

## 2021-12-14 DIAGNOSIS — R87810 Cervical high risk human papillomavirus (HPV) DNA test positive: Secondary | ICD-10-CM

## 2021-12-14 DIAGNOSIS — L680 Hirsutism: Secondary | ICD-10-CM | POA: Diagnosis not present

## 2021-12-14 MED ORDER — MEGESTROL ACETATE 40 MG PO TABS: 80.0000 mg | ORAL_TABLET | Freq: Two times a day (BID) | ORAL | 4 refills | Status: DC

## 2021-12-14 MED ORDER — DICLOFENAC SODIUM 75 MG PO TBEC: 75.0000 mg | DELAYED_RELEASE_TABLET | Freq: Two times a day (BID) | ORAL | 2 refills | Status: DC

## 2021-12-14 NOTE — Progress Notes (Signed)
Patient is experiencing heavy painful periods. Patient reports heavy period on Dec1-6th and then again Dec10-15,2022 Armandina Stammer RN

## 2021-12-15 DIAGNOSIS — N939 Abnormal uterine and vaginal bleeding, unspecified: Secondary | ICD-10-CM | POA: Insufficient documentation

## 2021-12-15 DIAGNOSIS — L680 Hirsutism: Secondary | ICD-10-CM | POA: Insufficient documentation

## 2021-12-15 NOTE — Progress Notes (Signed)
Subjective:    Patient ID: Tina Manning, female    DOB: 25-Jan-1991, 30 y.o.   MRN: [id number]  HPI  This is a 30 year old G0 who presents with many years of abnormal uterine bleeding with menorrhagia and dysmenorrhea.  Patient has heavy bleeding for almost half a month she will have 5 to 6 days of heavy bleeding, and a couple of days of no periods, with return to heavy bleeding.  She might go 4 to 5 days without bleeding with a few light days in between.  She has to change her pad every hour and a half or so.  Pads are saturated.  Advil and Tylenol doing nothing to help with dysmenorrhea.  She does use ibuprofen or Midol and heating pad.    About 10 years ago she had an ultrasound done I encouraged and was told she had endometriosis and fibroid tumors.  She has gone through different COC's in order to try to help with bleeding.  These have included Tri-Cyclen Lo, Loestrin, Sprintec, Yasmin, Tri-Sprintec.  None of these have helped.  Her cramps are very consistent with pain that is very severe to the point that makes her nauseated and almost vomit.  No palliating or provoking factors.  Patient does have hirsutism on her neck, chin, chest.  She shaves this every other day.  Hair growth is fairly significant.  The patient has been told that she is prediabetic.  Review of Systems  Constitutional:  Positive for fatigue. Negative for chills and fever.  Genitourinary:  Negative for dysuria and vaginal discharge.  Neurological:  Positive for weakness. Negative for dizziness, light-headedness and numbness.      Objective:   Physical Exam Vitals reviewed.  Constitutional:      Appearance: Normal appearance.  HENT:     Head: Normocephalic and atraumatic.  Cardiovascular:     Rate and Rhythm: Normal rate and regular rhythm.     Pulses: Normal pulses.  Pulmonary:     Effort: Pulmonary effort is normal.     Breath sounds: Normal breath sounds.  Abdominal:     General: Abdomen is flat.      Palpations: Abdomen is soft.  Skin:    Capillary Refill: Capillary refill takes less than 2 seconds.     Comments: States she 2-3 hirsutism on the patient's neck and chest.  Neurological:     General: No focal deficit present.     Mental Status: She is alert.  Psychiatric:        Mood and Affect: Mood normal.        Behavior: Behavior normal.      Assessment & Plan:  1. Abnormal uterine bleeding (AUB) At this point, it is uncertain as to the patient's etiology.  This may be an ovulatory bleeding.  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  At this point, COC's have not been helpful and so there is little utility in trying this.  We will start with Megace 80 mg twice a day and attempt to capture the patient's bleeding.  We will decrease this over time to maintain effective bleeding control.  Anticipate that the dysmenorrhea is connected with the AUB.  We will give the patient Voltaren to help with pain while at work attempting to get the bleeding under control.  Will check CBC, TSH, testosterone, FSH, LH, hemoglobin A1c and will get an ultrasound to evaluate the endometrium. - CBC - Thyroid Panel With TSH - TestT+TestF+SHBG - Follicle  stimulating hormone - LH - Hemoglobin A1c - US PELVIC COMPLETE WITH TRANSVAGINAL; Future  2. 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  3. 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

## 2021-12-16 LAB — CBC
Hematocrit: 38.8 % (ref 34.0–46.6)
Hemoglobin: 12.3 g/dL (ref 11.1–15.9)
MCH: 25.6 pg — ABNORMAL LOW (ref 26.6–33.0)
MCHC: 31.7 g/dL (ref 31.5–35.7)
MCV: 81 fL (ref 79–97)
Platelets: 454 10*3/uL — ABNORMAL HIGH (ref 150–450)
RBC: 4.8 x10E6/uL (ref 3.77–5.28)
RDW: 14.6 % (ref 11.7–15.4)
WBC: 10.1 10*3/uL (ref 3.4–10.8)

## 2021-12-16 LAB — THYROID PANEL WITH TSH
Free Thyroxine Index: 1.4 (ref 1.2–4.9)
T3 Uptake Ratio: 20 % — ABNORMAL LOW (ref 24–39)
T4, Total: 7.2 ug/dL (ref 4.5–12.0)
TSH: 1.95 u[IU]/mL (ref 0.450–4.500)

## 2021-12-16 LAB — TESTT+TESTF+SHBG
Sex Hormone Binding: 30.3 nmol/L (ref 24.6–122.0)
Testosterone, Free: 2.4 pg/mL (ref 0.0–4.2)
Testosterone, Total, LC/MS: 74.5 ng/dL — ABNORMAL HIGH (ref 10.0–55.0)

## 2021-12-16 LAB — HEMOGLOBIN A1C
Est. average glucose Bld gHb Est-mCnc: 126 mg/dL
Hgb A1c MFr Bld: 6 % — ABNORMAL HIGH (ref 4.8–5.6)

## 2021-12-16 LAB — LUTEINIZING HORMONE: LH: 12.5 m[IU]/mL

## 2021-12-16 LAB — FOLLICLE STIMULATING HORMONE: FSH: 4.9 m[IU]/mL

## 2021-12-19 ENCOUNTER — Ambulatory Visit (HOSPITAL_BASED_OUTPATIENT_CLINIC_OR_DEPARTMENT_OTHER)
Admission: RE | Admit: 2021-12-19 | Discharge: 2021-12-19 | Disposition: A | Discharge status: Home / Self Care | Payer: 59 | Source: Ambulatory Visit | Attending: Family Medicine | Admitting: Family Medicine

## 2021-12-19 ENCOUNTER — Other Ambulatory Visit: Payer: Self-pay

## 2021-12-19 DIAGNOSIS — N939 Abnormal uterine and vaginal bleeding, unspecified: Secondary | ICD-10-CM | POA: Diagnosis present

## 2021-12-21 MED ORDER — TRAMADOL HCL 50 MG PO TABS: 50.0000 mg | ORAL_TABLET | Freq: Four times a day (QID) | ORAL | 0 refills | Status: DC | PRN

## 2021-12-21 MED ORDER — METFORMIN HCL 500 MG PO TABS: 500.0000 mg | ORAL_TABLET | Freq: Two times a day (BID) | ORAL | 5 refills | Status: DC

## 2021-12-26 ENCOUNTER — Other Ambulatory Visit: Payer: Self-pay | Admitting: Internal Medicine

## 2021-12-26 DIAGNOSIS — J4540 Moderate persistent asthma, uncomplicated: Secondary | ICD-10-CM

## 2021-12-28 ENCOUNTER — Encounter: Payer: Self-pay | Admitting: Family Medicine

## 2021-12-29 MED ORDER — TRAMADOL HCL 50 MG PO TABS: 100.0000 mg | ORAL_TABLET | Freq: Four times a day (QID) | ORAL | 0 refills | Status: DC | PRN

## 2021-12-29 MED ORDER — MEDROXYPROGESTERONE ACETATE 10 MG PO TABS: 20.0000 mg | ORAL_TABLET | Freq: Every day | ORAL | 3 refills | Status: DC

## 2021-12-29 NOTE — Addendum Note (Signed)
Addended by: Truett Mainland on: 12/29/2021 12:08 PM   Modules accepted: Orders

## 2022-01-01 ENCOUNTER — Ambulatory Visit (INDEPENDENT_AMBULATORY_CARE_PROVIDER_SITE_OTHER): Payer: 59 | Admitting: Primary Care

## 2022-01-01 ENCOUNTER — Encounter: Payer: Self-pay | Admitting: Primary Care

## 2022-01-01 ENCOUNTER — Ambulatory Visit (INDEPENDENT_AMBULATORY_CARE_PROVIDER_SITE_OTHER): Payer: 59

## 2022-01-01 ENCOUNTER — Other Ambulatory Visit: Payer: Self-pay

## 2022-01-01 VITALS — BP 120/86 | HR 92 | Temp 98.3°F | Ht 63.0 in | Wt 318.8 lb

## 2022-01-01 DIAGNOSIS — R0602 Shortness of breath: Secondary | ICD-10-CM | POA: Diagnosis not present

## 2022-01-01 DIAGNOSIS — J45909 Unspecified asthma, uncomplicated: Secondary | ICD-10-CM

## 2022-01-01 LAB — NITRIC OXIDE: FeNO level (ppb): 25

## 2022-01-01 MED ORDER — PREDNISONE 10 MG PO TABS: ORAL_TABLET | ORAL | 0 refills | Status: AC

## 2022-01-01 MED ORDER — METHYLPREDNISOLONE ACETATE 80 MG/ML IJ SUSP
80.0000 mg | Freq: Once | INTRAMUSCULAR | Status: AC
  Administered 2022-01-01: 80 mg via INTRAMUSCULAR

## 2022-01-01 NOTE — Progress Notes (Signed)
Please let patient know CXR showed no active cardiopulmonary disease. Clear lungs

## 2022-01-01 NOTE — Progress Notes (Signed)
@Patient  ID: Tina Manning, female    DOB: 1991-03-21, 31 y.o.   MRN: [id number]  Chief Complaint  Patient presents with   Follow-up    Patient says she's having shortness of breath a lot when she walks.    Referring provider: Camillia Herter, NP  HPI: 31 year female, never smoked. PMH significant for asthma, obesity,  hyperlipidemia, prediabetes. Patient of Dr. Melvyn Novas. Previous [facility] pulmonary encounter: 11/07/2021  f/u ov/Wert re: chronic asthma  maint on dulera  200 bid due to ha      Chief Complaint  Patient presents with   Follow-up      Upper left back pain with inspiration last pm.  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      Also denies any obvious fluctuation of symptoms with weather or environmental changes or other aggravating or alleviating factors except as outlined above    No unusual exposure hx or h/o childhood pna/   knowledge of premature birth.  The abrupt onset of L pleuritic cp s cough or MS injury to chest or shoulder along with sinus Tach are all worrisome for PE > advised to go directly to ER but wants to pick up husband first and "call us back if condition worsened"  I advised against this and feel PE is dx of exclusion 1st and explained why to pt.   F/u can be in 6 weeks once we sort out her acute problem preferably today.   11/13/2021 Patient presents today for acute follow-up. She was seen on 11/15 for pleuritic chest pain and told she needed to go to  ED, however, she declined. Dr. Melvyn Novas recommended chest imaging and labs, these have not been ordered or completed. We will get these today.   She is no longer experiencing pleuritic chest pain. Pain lasted for 2 days and went away. 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 She is dealing with some life stress and is following with both behavioral health and psychiatry. She is taking Dulera two puffs morning and evening. She has a productive cough with clear mucus at night. She denies shortness of breath.   12/05/2021 Patient presents today for follow-up. She is some doing better. Shortness of breath is not as bad. She still has some chest congestion. Her blood pressure remains elevated and she continues to have episodes of tachycardia, she is seeing cardiology and currently wearing heart monitor. She is maintained on Dulera and Singulair. During our last visit we increased Trazodone to 100mg  at bedtime for insomnia.  She called our office on 12/01/21 with reports of inability to sleep. Dr. Ander Slade sent in prescription for Lunesta 2mg . Continue to follow with behavioral health/psychiatry.   01/01/2022 Patient presents today for acute OV. She developed shortness of  breath symptoms with associated chest tightness, wheezing and non-productive cough 1.5 weeks ago. She is currently on zpack and prednisone taper for ear infection which was prescribed on 12/30/20. She is compliant with Dulera 275mcg two puffs twice daily and has been using duoneb nebulizer once daily. Her asthma symptoms have been well controlled until recently.    Allergies  Allergen Reactions   Bee Venom Anaphylaxis   Ketorolac Tromethamine Palpitations and Shortness Of Breath   Penicillins Other (See Comments)    Intolerance     Immunization History  Administered Date(s) Administered   Influenza,inj,Quad PF,6+ Mos 11/07/2021   PFIZER(Purple  Top)SARS-COV-2 Vaccination 01/27/2020, 02/25/2020    Past Medical History:  Diagnosis Date   Anemia    Asthma    Complication of anesthesia    Endometriosis    Hypertension    PONV (postoperative nausea and vomiting)    Tachycardia     Tobacco History: Social History   Tobacco Use  Smoking Status Never  Smokeless Tobacco Never   Counseling given: Not Answered   Outpatient Medications Prior to Visit  Medication Sig Dispense Refill   albuterol (VENTOLIN HFA) 108 (90 Base) MCG/ACT inhaler INHALE 1 TO 2 PUFFS INTO THE LUNGS EVERY 6 HOURS AS NEEDED FOR WHEEZING OR SHORTNESS OF BREATH 6.7 g 2   atorvastatin (LIPITOR) 40 MG tablet TAKE 1 TABLET(40 MG) BY MOUTH DAILY 90 tablet 0   azithromycin (ZITHROMAX) 500 MG tablet Take by mouth daily.     busPIRone (BUSPAR) 5 MG tablet Take 1 tablet (5 mg total) by mouth daily. 30 tablet 0   hydrOXYzine (VISTARIL) 50 MG capsule Take 1 capsule (50 mg total) by mouth every 8 (eight) hours as needed. 30 capsule 0   ipratropium-albuterol (DUONEB) 0.5-2.5 (3) MG/3ML SOLN Take 3 mLs by nebulization in the morning, at noon, in the evening, and at bedtime.     metFORMIN (GLUCOPHAGE) 500 MG tablet Take 1 tablet (500 mg total) by mouth 2 (two) times daily with a meal. 60 tablet 5   metoprolol succinate (TOPROL-XL) 25 MG 24 hr tablet TAKE 1 TABLET(25 MG) BY MOUTH DAILY 90 tablet 0   mometasone-formoterol (DULERA) 200-5 MCG/ACT AERO Take 2 puffs first thing in am and then another 2 puffs about 12 hours later. 1 each 11   montelukast (SINGULAIR) 10 MG tablet Take 1 tablet (10 mg total) by mouth at bedtime. 30 tablet 11   pantoprazole (PROTONIX) 40 MG tablet Take 1 tablet (40 mg total) by mouth daily. 30 tablet 2   sertraline (ZOLOFT) 100 MG tablet Take 1 tablet (100 mg total) by mouth daily. 30 tablet 0   traMADol (ULTRAM) 50 MG tablet Take 2 tablets (100 mg total) by mouth every 6 (six) hours as needed for severe pain. 60 tablet 0   zolpidem (AMBIEN CR) 12.5  MG CR tablet Take 1 tablet (12.5 mg total) by mouth at bedtime as needed for sleep. 30 tablet 1   medroxyPROGESTERone (PROVERA) 10 MG tablet Take 2 tablets (20 mg total) by mouth daily. (Patient not taking: Reported on 01/01/2022) 60 tablet 3   No facility-administered medications prior to visit.    Review of Systems  Review of Systems  Constitutional: Negative.   HENT:  Positive for ear pain.   Respiratory:  Positive for cough, chest tightness, shortness of breath and wheezing.     Physical Exam  BP 120/86 (BP Location: Right Arm, Patient Position: Sitting, Cuff Size: Large)    Pulse 92  Temp 98.3 F (36.8 C) (Oral)    Ht 5\' 3"  (1.6 m)    Wt (!) 318 lb 12.8 oz (144.6 kg)    LMP 12/02/2021 (Exact Date)    SpO2 98%    BMI 56.47 kg/m  Physical Exam Constitutional:      Appearance: Normal appearance.  HENT:     Head: Normocephalic and atraumatic.     Right Ear: Hearing, tympanic membrane and ear canal normal.     Left Ear: Hearing and ear canal normal. Tympanic membrane is injected.     Mouth/Throat:     Mouth: Mucous membranes are moist.     Pharynx: Oropharynx is clear.  Pulmonary:     Effort: Pulmonary effort is normal.     Breath sounds: Wheezing present. No rhonchi.  Skin:    General: Skin is warm and dry.  Neurological:     General: No focal deficit present.     Mental Status: She is alert and oriented to person, place, and time. Mental status is at baseline.  Psychiatric:        Mood and Affect: Mood normal.        Behavior: Behavior normal.        Thought Content: Thought content normal.        Judgment: Judgment normal.     Lab Results:  CBC    Component Value Date/Time   WBC 10.1 12/14/2021 1106   WBC 10.3 11/13/2021 0949   RBC 4.80 12/14/2021 1106   RBC 4.75 11/13/2021 0949   HGB 12.3 12/14/2021 1106   HCT 38.8 12/14/2021 1106   PLT 454 (H) 12/14/2021 1106   MCV 81 12/14/2021 1106   MCH 25.6 (L) 12/14/2021 1106   MCH 25.5 (L) 05/15/2021 1410   MCHC  31.7 12/14/2021 1106   MCHC 31.8 11/13/2021 0949   RDW 14.6 12/14/2021 1106   LYMPHSABS 1.9 11/13/2021 0949   MONOABS 0.7 11/13/2021 0949   EOSABS 0.6 11/13/2021 0949   BASOSABS 0.1 11/13/2021 0949    BMET    Component Value Date/Time   NA 137 11/13/2021 0949   K 3.8 11/13/2021 0949   CL 107 11/13/2021 0949   CO2 22 11/13/2021 0949   GLUCOSE 96 11/13/2021 0949   BUN 11 11/13/2021 0949   CREATININE 0.80 11/13/2021 0949   CALCIUM 8.5 11/13/2021 0949   GFRNONAA >60 05/15/2021 1410   GFRAA >60 02/27/2018 0716    BNP No results found for: BNP  ProBNP No results found for: PROBNP  Imaging: DG Chest 2 View  Result Date: 01/01/2022 CLINICAL DATA:  Shortness of breath.  Asthma exacerbation EXAM: CHEST - 2 VIEW COMPARISON:  11/13/2021 FINDINGS: Lateral view degraded by patient arm position. Midline trachea.  Normal heart size and mediastinal contours. Sharp costophrenic angles.  No pneumothorax.  Clear lungs. IMPRESSION: No active cardiopulmonary disease. Electronically Signed   By: Abigail Miyamoto M.D.   On: 01/01/2022 10:55   US PELVIC COMPLETE WITH TRANSVAGINAL  Result Date: 12/19/2021 CLINICAL DATA:  Abnormal uterine bleeding. EXAM: TRANSABDOMINAL AND TRANSVAGINAL ULTRASOUND OF PELVIS TECHNIQUE: Both transabdominal and transvaginal ultrasound examinations of the pelvis were performed. Transabdominal technique was performed for global imaging of the pelvis including uterus, ovaries, adnexal regions, and pelvic cul-de-sac. It was necessary to proceed with endovaginal exam following the transabdominal exam to visualize the endometrium and ovaries. COMPARISON:  None FINDINGS: Uterus Measurements: 7.3 x 4.8 x 4.1 cm = volume: 75 mL. 2 cm fibroid is noted in anterior portion of  uterus. Endometrium Thickness: 6 mm which is within normal limits. No focal abnormality visualized. Right ovary Measurements: 2.4 x 2.5 x 2.2 cm = volume: 7 mL. Normal appearance/no adnexal mass. Left ovary Measurements:  3.0 x 2.8 x 2.5 cm = volume: 11 mL. Normal appearance/no adnexal mass. Other findings No abnormal free fluid. IMPRESSION: 2 cm uterine fibroid is noted. No other definite abnormality is noted in the pelvis. If bleeding remains unresponsive to hormonal or medical therapy, sonohysterogram should be considered for focal lesion work-up. (Ref: Radiological Reasoning: Algorithmic Workup of Abnormal Vaginal Bleeding with Endovaginal Sonography and Sonohysterography. AJR [id number]) Electronically Signed   By: Marijo Conception M.D.   On: 12/19/2021 20:48   LONG TERM MONITOR (3-14 DAYS)  Result Date: 12/24/2021 Enrollment 12/02/2021-12/07/2021 (4 days 6 hours). Patient had a min HR of 56 bpm (sinus bradycardia), max HR of 179 bpm (sinus tachycardia), and avg HR of 108 bpm (sinus tachycardia). Predominant underlying rhythm was Sinus Rhythm. Isolated SVEs were rare (<1.0%), and no SVE Couplets or SVE Triplets were present. Isolated VEs were rare (<1.0%), and no VE Couplets or VE Triplets were present. 12/02/21 06:15pm Chest pain or pressure coincided with sinus tachycardia 120 bpm. 12/02/21 07:54pm Anxious coincided with sinus tachycardia 118 bpm. 12/03/21 01:37am Chest pain or pressure, Skipped beat(s)/Irregular beats, Short of breath coincided with sinus tachycardia 102 bpm. 12/03/21 07:46am Fluttering or racing coincided with sinus tachycardia 106 bpm. 12/04/21 06:14pm Chest pain or pressure, Light headed, Short of Breath coincided with sinus tachycardia 143 bpm. Impression: 1. No arrhythmias detected. 2. Average heart rate 108 bpm (sinus tachycardia). 3. Rare ectopy. [place] T. Audie Box, MD, 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 Currently being treated with zpack and prednisone pack for otitis media. She has wheezing throughout lung fields on exam. She  is compliant with Dulera 247mcg two puffs daily and Singulair 10mg  at bedtime. She has been requiring ipratropium-albuterol nebulizer daily. FENO was 26 today. She received depomedrol 80mg  IM x 1 today in office and albuterol nebulizer. Checking CXR. FU in 4-6 week with Dr. Melvyn Novas, if continuing to have exacerbations recommend discussing potential need for biologics (advised she bring all medication with her to visit).    Recommendations: Continue Dulera 256mcg two puffs morning and evening Use DUONEB nebulizer every 6 hours for breakthrough shortness of breath or wheezing Continue Singulair 10mg  at bedtime  Continue zpack Change prednisone taper 50mg  x 2 days; 40mg  x 3 days; 30mg  x 3 days; 20mg  x 3 days;10mg  x 3 days  Office treatment: Depomedrol 80mg  today Albuterol nebulizer x 1 today  Orders: FENO re: asthma (done) CXR re: shortness of breath (ordered)  Follow-up: 4-6 weeks     Martyn Ehrich, NP 01/01/2022

## 2022-01-01 NOTE — Patient Instructions (Addendum)
Recommendations: Continue Dulera two puffs morning and evening Use nebulizer every 6 hours for breakthrough shortness of breath or wheezing Continue Singulair 10mg  at bedtime  Continue zpack Change prednisone taper, take as directed(50mg  x 2 days; 40mg  x 3 days; 30mg  x 3 days; 20mg  x 3 days;10mg  x 3 days)  Office treatment: Depomedrol 80mg  today Albuterol nebulizer x 1 today  Orders: FENO re: asthma (done) CXR re: shortness of breath (ordered)  Follow-up: Return to discuss starting biologics with Dr. for asthma

## 2022-01-01 NOTE — Assessment & Plan Note (Addendum)
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 Currently being treated with zpack and prednisone pack for otitis media. She has wheezing throughout lung fields on exam. She is compliant with Dulera two puffs daily and Singulair 10mg  at bedtime. She has been requiring ipratropium-albuterol nebulizer daily. FENO was 26 today. She received depomedrol 80mg  IM x 1 today in office and albuterol nebulizer. Checking CXR. FU in 4-6 week with Dr. , if continuing to have exacerbations recommend discussing potential need for biologics (advised she bring all medication with her to visit).   Recommendations: Continue Dulera two puffs morning and evening Use DUONEB nebulizer every 6 hours for breakthrough shortness of breath or wheezing Continue Singulair 10mg  at bedtime  Continue zpack Change prednisone taper 50mg  x 2 days; 40mg  x 3 days; 30mg  x 3 days; 20mg  x 3 days;10mg  x 3 days  Office treatment: Depomedrol 80mg  today Albuterol nebulizer x 1 today  Orders: FENO re: asthma (done) CXR re: shortness of breath (ordered)  Follow-up: 4-6 weeks

## 2022-01-02 ENCOUNTER — Ambulatory Visit: Payer: 59 | Admitting: Orthopaedic Surgery

## 2022-01-02 NOTE — Telephone Encounter (Signed)
Her CXR was fine. No acute cardiopulmonary disease. Heart size is normal and lungs are clear.

## 2022-01-03 ENCOUNTER — Ambulatory Visit (INDEPENDENT_AMBULATORY_CARE_PROVIDER_SITE_OTHER): Payer: 59 | Admitting: Licensed Clinical Social Worker

## 2022-01-03 DIAGNOSIS — F5102 Adjustment insomnia: Secondary | ICD-10-CM

## 2022-01-03 DIAGNOSIS — F331 Major depressive disorder, recurrent, moderate: Secondary | ICD-10-CM

## 2022-01-03 DIAGNOSIS — F411 Generalized anxiety disorder: Secondary | ICD-10-CM

## 2022-01-03 DIAGNOSIS — F410 Panic disorder [episodic paroxysmal anxiety] without agoraphobia: Secondary | ICD-10-CM

## 2022-01-03 NOTE — Progress Notes (Signed)
Virtual Visit via Telephone Note  I connected with Tina Manning, on 01/08/2022 at 2:53 PM by telephone due to the COVID-19 pandemic and verified that I am speaking with the correct person using two identifiers.  Due to current restrictions/limitations of in-office visits due to the COVID-19 pandemic, this scheduled clinical appointment was converted to a telehealth visit.   Consent: I discussed the limitations, risks, security and privacy concerns of performing an evaluation and management service by telephone and the availability of in person appointments. I also discussed with the patient that there may be a patient responsible charge related to this service. The patient expressed understanding and agreed to proceed.   Location of Patient: Home  Location of Provider: [facility]   Persons participating in Telemedicine visit: Tina Manning Tina Stabs, NP Tina Manning, CMA   History of Present Illness: Tina Manning. Tina Manning is a 31 year-old female who presents for back pain follow-up.   BACK PAIN FOLLOW-UP: 10/31/2021 with Angus Seller, NP: Will order prednisone Will order ibuprofen to start after prednisone is complete Will order tizanidine  11/28/2021 diagnostic x-ray lumbar spine: IMPRESSION: Mild degenerative disc space narrowing at L5-S1.  01/08/2022: Reports back still hurting. Has established with back doctor, no longer needs referral.   2. COVID: 01/05/2022 at [facility] per NP note: Chest x-ray was negative for any acute cardiopulmonary process.  Will treat right ear infection with cefdinir antibiotic.  Lung sounds are clear.  Patient to continue albuterol inhaler as needed.  Do not think that additional steroids are needed at this time.  Advised patient to go to the hospital if symptoms do not improve or worsen.  No signs of respiratory compromise on exam.  Discussed return precautions.   Patient verbalized understanding and was agreeable with plan.  01/08/2022: Taking Tylenol, Ibuprofen, Albuterol inhaler, and antibiotics without relief. Reports [facility] Covid pill [facility] recently. Body aches, taking Tramadol not helping. Productive cough of yellow phelgm keeping awake at night. Denies chest pain. Shortness of breath complicated by current asthma.  Past Medical History:  Diagnosis Date   Anemia    Asthma    Complication of anesthesia    Endometriosis    Hypertension    PONV (postoperative nausea and vomiting)    Tachycardia    Allergies  Allergen Reactions   Bee Venom Anaphylaxis   Ketorolac Tromethamine Palpitations and Shortness Of Breath   Penicillins Other (See Comments)    Intolerance     Current Outpatient Medications on File Prior to Visit  Medication Sig Dispense Refill   albuterol (VENTOLIN HFA) 108 (90 Base) MCG/ACT inhaler INHALE 1 TO 2 PUFFS INTO THE LUNGS EVERY 6 HOURS AS NEEDED FOR WHEEZING OR SHORTNESS OF BREATH 6.7 g 2   atorvastatin (LIPITOR) 40 MG tablet TAKE 1 TABLET(40 MG) BY MOUTH DAILY 90 tablet 0   azithromycin (ZITHROMAX) 500 MG tablet Take by mouth daily.     busPIRone (BUSPAR) 5 MG tablet Take 1 tablet (5 mg total) by mouth daily. 30 tablet 0   cefdinir (OMNICEF) 300 MG capsule Take 1 capsule (300 mg total) by mouth 2 (two) times daily for 10 days. 20 capsule 0   hydrOXYzine (VISTARIL) 50 MG capsule Take 1 capsule (50 mg total) by mouth every 8 (eight) hours as needed. 30 capsule 0   ipratropium-albuterol (DUONEB) 0.5-2.5 (3) MG/3ML SOLN Take 3 mLs by nebulization in the morning, at noon, in the  evening, and at bedtime. 360 mL 2   medroxyPROGESTERone (PROVERA) 10 MG tablet Take 2 tablets (20 mg total) by mouth daily. (Patient not taking: Reported on 01/01/2022) 60 tablet 3   metFORMIN (GLUCOPHAGE) 500 MG tablet Take 1 tablet (500 mg total) by mouth 2 (two) times daily with a meal. 60 tablet 5   metoprolol succinate  (TOPROL-XL) 25 MG 24 hr tablet TAKE 1 TABLET(25 MG) BY MOUTH DAILY 90 tablet 0   mometasone-formoterol (DULERA) 200-5 MCG/ACT AERO Take 2 puffs first thing in am and then another 2 puffs about 12 hours later. 1 each 11   montelukast (SINGULAIR) 10 MG tablet Take 1 tablet (10 mg total) by mouth at bedtime. 30 tablet 11   pantoprazole (PROTONIX) 40 MG tablet Take 1 tablet (40 mg total) by mouth daily. 30 tablet 2   predniSONE (DELTASONE) 10 MG tablet Take 5 tablets (50 mg total) by mouth daily with breakfast for 3 days, THEN 4 tablets (40 mg total) daily with breakfast for 3 days, THEN 3 tablets (30 mg total) daily with breakfast for 3 days, THEN 2 tablets (20 mg total) daily with breakfast for 3 days, THEN 1 tablet (10 mg total) daily with breakfast for 3 days. 45 tablet 0   sertraline (ZOLOFT) 100 MG tablet Take 1 tablet (100 mg total) by mouth daily. 30 tablet 0   traMADol (ULTRAM) 50 MG tablet Take 2 tablets (100 mg total) by mouth every 6 (six) hours as needed for severe pain. 60 tablet 0   zolpidem (AMBIEN CR) 12.5 MG CR tablet Take 1 tablet (12.5 mg total) by mouth at bedtime as needed for sleep. 30 tablet 1   No current facility-administered medications on file prior to visit.    Observations/Objective: Alert and oriented x 3. Not in acute distress. Physical examination not completed as this is a telemedicine visit.  Assessment and Plan: 1. Acute right-sided low back pain with right-sided sciatica: - Patient established with preferred back doctor, keep all scheduled appointments.   2. COVID: - Continue current regimen.  - Benzonatate capsules as prescribed.  - Patient was given clear instructions to go to Emergency Department or return to medical center if symptoms don't improve, worsen, or new problems develop.The patient verbalized understanding. - benzonatate (TESSALON) 100 MG capsule; Take 1 capsule (100 mg total) by mouth 3 (three) times daily as needed for cough.  Dispense: 20  capsule; Refill: 0  Follow Up Instructions: Follow-up with primary provider as scheduled.    Patient was given clear instructions to go to Emergency Department or return to medical center if symptoms don't improve, worsen, or new problems develop.The patient verbalized understanding.  I discussed the assessment and treatment plan with the patient. The patient was provided an opportunity to ask questions and all were answered. The patient agreed with the plan and demonstrated an understanding of the instructions.   The patient was advised to call back or seek an in-person evaluation if the symptoms worsen or if the condition fails to improve as anticipated.    I provided 5 minutes total of non-face-to-face time during this encounter.   Rema Fendt, NP  [facility] at [facility] [place], [place] [phone] 01/08/2022, 2:53 PM
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
## 2022-01-04 ENCOUNTER — Telehealth: Payer: Self-pay | Admitting: Pulmonary Disease

## 2022-01-04 ENCOUNTER — Other Ambulatory Visit: Payer: Self-pay | Admitting: Family

## 2022-01-04 ENCOUNTER — Other Ambulatory Visit: Payer: Self-pay

## 2022-01-04 DIAGNOSIS — J4540 Moderate persistent asthma, uncomplicated: Secondary | ICD-10-CM

## 2022-01-04 MED ORDER — IPRATROPIUM-ALBUTEROL 0.5-2.5 (3) MG/3ML IN SOLN: 3.0000 mL | Freq: Four times a day (QID) | RESPIRATORY_TRACT | 2 refills | Status: DC

## 2022-01-04 NOTE — Telephone Encounter (Signed)
Please let patient know CXR showed no active cardiopulmonary disease. Clear lungs  Patient is aware of results and voiced her understanding.  Nothing further needed at this time.

## 2022-01-05 ENCOUNTER — Ambulatory Visit (INDEPENDENT_AMBULATORY_CARE_PROVIDER_SITE_OTHER): Payer: 59

## 2022-01-05 ENCOUNTER — Encounter: Payer: Self-pay | Admitting: Emergency Medicine

## 2022-01-05 ENCOUNTER — Ambulatory Visit
Admission: EM | Admit: 2022-01-05 | Discharge: 2022-01-05 | Disposition: A | Discharge status: Home / Self Care | Payer: 59 | Attending: Internal Medicine | Admitting: Internal Medicine

## 2022-01-05 ENCOUNTER — Other Ambulatory Visit: Payer: Self-pay

## 2022-01-05 DIAGNOSIS — R0602 Shortness of breath: Secondary | ICD-10-CM | POA: Diagnosis not present

## 2022-01-05 DIAGNOSIS — J069 Acute upper respiratory infection, unspecified: Secondary | ICD-10-CM | POA: Diagnosis present

## 2022-01-05 DIAGNOSIS — J029 Acute pharyngitis, unspecified: Secondary | ICD-10-CM | POA: Insufficient documentation

## 2022-01-05 LAB — POCT INFLUENZA A/B
Influenza A, POC: NEGATIVE
Influenza B, POC: NEGATIVE

## 2022-01-05 LAB — POCT RAPID STREP A (OFFICE): Rapid Strep A Screen: NEGATIVE

## 2022-01-05 MED ORDER — CEFDINIR 300 MG PO CAPS: 300.0000 mg | ORAL_CAPSULE | Freq: Two times a day (BID) | ORAL | 0 refills | Status: AC

## 2022-01-05 NOTE — Discharge Instructions (Signed)
Your chest x-ray was normal.  Rapid strep and rapid flu are negative.  COVID-19 PCR is pending.  You have been prescribed antibiotic for right ear infection and upper respiratory infection.  Please go to the hospital if symptoms worsen.

## 2022-01-05 NOTE — ED Provider Notes (Signed)
[facility]    CSN: [id number] Arrival date & time: 01/05/22  1456      History   Chief Complaint Chief Complaint  Patient presents with   Generalized Body Aches    HPI Tina Manning is a 31 y.o. female.   Patient presents with body aches, nasal congestion, nonproductive cough, sore throat that has been present for over 1 week.  She reports that she saw a doctor via video visit and was prescribed azithromycin and prednisone for 5 days.  She completed this course of treatment with minimal improvement in symptoms.  She then saw her pulmonologist a few days ago and was prescribed another course of prednisone as well as receiving Depo-Medrol injection with no improvement.  Chest x-ray was negative at pulmonology visit.  She reports that her symptoms worsened today.  Denies any known fever.  She does have intermittent shortness of breath and has had to use her albuterol inhaler .  Denies chest pain, ear pain, nausea, vomiting, diarrhea, abdominal pain.    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   Hyperlipidemia 08/15/2021   Prediabetes 08/15/2021   Morbid obesity due to excess calories ([place]) 08/15/2021   Closed fracture of lower end of right radius with routine healing    Anxiety and depression 08/09/2011   Endometriosis 03/27/2009   Tachycardia 11/27/1998   Asthma 04/24/1994    Past Surgical History:  Procedure  Laterality Date   CHOLECYSTECTOMY     OPEN REDUCTION INTERNAL FIXATION (ORIF) DISTAL RADIAL FRACTURE Right 05/15/2021   Procedure: OPEN REDUCTION INTERNAL FIXATION (ORIF)RIGHT  DISTAL RADIAL FRACTURE;  Surgeon: Meredith Pel, MD;  Location: [place];  Service: Orthopedics;  Laterality: Right;    OB History     Gravida  0   Para  0   Term  0   Preterm  0   AB  0   Living  0      SAB  0   IAB  0   Ectopic  0   Multiple  0   Live Births  0            Home Medications    Prior to Admission medications   Medication Sig Start Date End Date Taking? Authorizing Provider  albuterol (VENTOLIN HFA) 108 (90 Base) MCG/ACT inhaler INHALE 1 TO 2 PUFFS INTO THE LUNGS EVERY 6 HOURS AS NEEDED FOR WHEEZING OR SHORTNESS OF BREATH 12/26/21  Yes Tanda Rockers, MD  atorvastatin (LIPITOR) 40 MG tablet TAKE 1 TABLET(40 MG) BY MOUTH DAILY 10/27/21  Yes Minette Brine, Amy J, NP  busPIRone (BUSPAR) 5 MG tablet Take 1 tablet (5 mg total) by mouth daily. 12/06/21 12/06/22 Yes Merian Capron, MD  cefdinir (OMNICEF) 300 MG capsule Take 1 capsule (300 mg total) by mouth 2 (two) times daily for 10 days. 01/05/22 01/15/22 Yes Caven Perine, Michele Rockers, FNP  hydrOXYzine (VISTARIL)  50 MG capsule Take 1 capsule (50 mg total) by mouth every 8 (eight) hours as needed. 09/29/21  Yes Minette Brine, Amy J, NP  ipratropium-albuterol (DUONEB) 0.5-2.5 (3) MG/3ML SOLN Take 3 mLs by nebulization in the morning, at noon, in the evening, and at bedtime. 01/04/22  Yes Minette Brine, Amy J, NP  metFORMIN (GLUCOPHAGE) 500 MG tablet Take 1 tablet (500 mg total) by mouth 2 (two) times daily with a meal. 12/21/21  Yes Truett Mainland, DO  metoprolol succinate (TOPROL-XL) 25 MG 24 hr tablet TAKE 1 TABLET(25 MG) BY MOUTH DAILY 10/27/21  Yes Minette Brine, Amy J, NP  mometasone-formoterol (DULERA) 200-5 MCG/ACT AERO Take 2 puffs first thing in am and then another 2 puffs about 12 hours later. 08/15/21  Yes Tanda Rockers, MD  montelukast (SINGULAIR) 10 MG  tablet Take 1 tablet (10 mg total) by mouth at bedtime. 09/12/21  Yes Martyn Ehrich, NP  pantoprazole (PROTONIX) 40 MG tablet Take 1 tablet (40 mg total) by mouth daily. 09/12/21  Yes Martyn Ehrich, NP  predniSONE (DELTASONE) 10 MG tablet Take 5 tablets (50 mg total) by mouth daily with breakfast for 3 days, THEN 4 tablets (40 mg total) daily with breakfast for 3 days, THEN 3 tablets (30 mg total) daily with breakfast for 3 days, THEN 2 tablets (20 mg total) daily with breakfast for 3 days, THEN 1 tablet (10 mg total) daily with breakfast for 3 days. 01/01/22 01/16/22 Yes Martyn Ehrich, NP  sertraline (ZOLOFT) 100 MG tablet Take 1 tablet (100 mg total) by mouth daily. 12/05/21 12/05/22 Yes Merian Capron, MD  traMADol (ULTRAM) 50 MG tablet Take 2 tablets (100 mg total) by mouth every 6 (six) hours as needed for severe pain. 12/29/21  Yes Truett Mainland, DO  zolpidem (AMBIEN CR) 12.5 MG CR tablet Take 1 tablet (12.5 mg total) by mouth at bedtime as needed for sleep. 12/08/21  Yes Olalere, Adewale A, MD  azithromycin (ZITHROMAX) 500 MG tablet Take by mouth daily.    [provider]  medroxyPROGESTERone (PROVERA) 10 MG tablet Take 2 tablets (20 mg total) by mouth daily. Patient not taking: Reported on 01/01/2022 12/29/21   Truett Mainland, DO    Family History Family History  Problem Relation Age of Onset   Heart attack Mother    Hypertension Mother    Breast cancer Mother    Polycystic ovary syndrome Mother    Heart disease Father    Hypertension Father    Liver cancer Father     Social History Social History   Tobacco Use   Smoking status: Never   Smokeless tobacco: Never  Vaping Use   Vaping Use: Never used  Substance Use Topics   Alcohol use: No   Drug use: No     Allergies   Bee venom, Ketorolac tromethamine, and Penicillins   Review of Systems Review of Systems Per HPI  Physical Exam Triage Vital Signs ED Triage Vitals  Enc Vitals Group     BP  01/05/22 1514 (!) 168/103     Pulse Rate 01/05/22 1514 (!) 130     Resp 01/05/22 1514 20     Temp 01/05/22 1514 99.8 F (37.7 C)     Temp Source 01/05/22 1514 Oral     SpO2 01/05/22 1514 98 %     Weight 01/05/22 1515 (!) 315 lb (142.9 kg)     Height 01/05/22 1515 5\' 5"  (1.651 m)     Head Circumference --  Peak Flow --      Pain Score 01/05/22 1515 7     Pain Loc --      Pain Edu? --      Excl. in [place]? --    No data found.  Updated Vital Signs BP (!) 168/103 (BP Location: Left Arm)    Pulse (!) 130    Temp 99.8 F (37.7 C) (Oral)    Resp 20    Ht 5\' 5"  (1.651 m)    Wt (!) 315 lb (142.9 kg)    LMP 01/02/2022    SpO2 98%    BMI 52.42 kg/m   Visual Acuity Right Eye Distance:   Left Eye Distance:   Bilateral Distance:    Right Eye Near:   Left Eye Near:    Bilateral Near:     Physical Exam Constitutional:      General: She is not in acute distress.    Appearance: Normal appearance. She is not toxic-appearing or diaphoretic.  HENT:     Head: Normocephalic and atraumatic.     Right Ear: Ear canal normal. Tympanic membrane is erythematous. Tympanic membrane is not perforated or bulging.     Left Ear: Tympanic membrane and ear canal normal.     Nose: Congestion present.     Mouth/Throat:     Mouth: Mucous membranes are moist.     Pharynx: Posterior oropharyngeal erythema present.  Eyes:     Extraocular Movements: Extraocular movements intact.     Conjunctiva/sclera: Conjunctivae normal.     Pupils: Pupils are equal, round, and reactive to light.  Cardiovascular:     Rate and Rhythm: Normal rate and regular rhythm.     Pulses: Normal pulses.     Heart sounds: Normal heart sounds.  Pulmonary:     Effort: Pulmonary effort is normal. No respiratory distress.     Breath sounds: Normal breath sounds. No stridor. No wheezing, rhonchi or rales.  Abdominal:     General: Abdomen is flat. Bowel sounds are normal.     Palpations: Abdomen is soft.  Musculoskeletal:         General: Normal range of motion.     Cervical back: Normal range of motion.  Skin:    General: Skin is warm and dry.  Neurological:     General: No focal deficit present.     Mental Status: She is alert and oriented to person, place, and time. Mental status is at baseline.  Psychiatric:        Mood and Affect: Mood normal.        Behavior: Behavior normal.     UC Treatments / Results  Labs (all labs ordered are listed, but only abnormal results are displayed) Labs Reviewed  CULTURE, GROUP A STREP ([place])  NOVEL CORONAVIRUS, NAA  POCT INFLUENZA A/B  POCT RAPID STREP A (OFFICE)    EKG   Radiology DG Chest 2 View  Result Date: 01/05/2022 CLINICAL DATA:  Shortness of breath. EXAM: CHEST - 2 VIEW COMPARISON:  January 01, 2022. FINDINGS: The heart size and mediastinal contours are within normal limits. Both lungs are clear. The visualized skeletal structures are unremarkable. IMPRESSION: No active cardiopulmonary disease. Electronically Signed   By: Marijo Conception M.D.   On: 01/05/2022 16:06    Procedures Procedures (including critical care time)  Medications Ordered in UC Medications - No data to display  Initial Impression / Assessment and Plan / UC Course  I have reviewed the triage vital signs  and the nursing notes.  Pertinent labs & imaging results that were available during my care of the patient were reviewed by me and considered in my medical decision making (see chart for details).     Chest x-ray was negative for any acute cardiopulmonary process.  Will treat right ear infection with cefdinir antibiotic.  Lung sounds are clear.  Patient to continue albuterol inhaler as needed.  Do not think that additional steroids are needed at this time.  Advised patient to go to the hospital if symptoms do not improve or worsen.  No signs of respiratory compromise on exam.  Discussed return precautions.  Patient verbalized understanding and was agreeable with plan. Final Clinical  Impressions(s) / UC Diagnoses   Final diagnoses:  Sore throat  Acute upper respiratory infection     Discharge Instructions      Your chest x-ray was normal.  Rapid strep and rapid flu are negative.  COVID-19 PCR is pending.  You have been prescribed antibiotic for right ear infection and upper respiratory infection.  Please go to the hospital if symptoms worsen.     ED Prescriptions     Medication Sig Dispense Auth. Provider   cefdinir (OMNICEF) 300 MG capsule Take 1 capsule (300 mg total) by mouth 2 (two) times daily for 10 days. 20 capsule Teodora Medici, [place]      PDMP not reviewed this encounter.   Teodora Medici, [place] 01/05/22 1630

## 2022-01-05 NOTE — Telephone Encounter (Signed)
No. Looks like omnicef was sent in already today. Nothing else further should be needed.

## 2022-01-05 NOTE — Telephone Encounter (Signed)
°  Congestion with associated wheezing on exam is most consistent with asthma exacerbation d/t acute URI/bronchitis. Please send in Augmentin 1 tab twice daily x 7 days. Make sure she is taking mucinex 1200mg  twice daily. If chest tightness/heaviness does not get better with prednisone or nebulizer treatments may need to UC for EKG

## 2022-01-05 NOTE — ED Triage Notes (Signed)
Patient states she's been sick for a while, body aches, congestion, cough over a week ago.  Pt did a teledoc visit a week ago and given an antbs which she finished yesterday.  Today sx's have worsened.

## 2022-01-05 NOTE — Telephone Encounter (Signed)
[facility] Pool (supporting Martyn Ehrich, NP) 3 hours ago (11:07 AM)   Since I was last in the office is there anything you can recommend or give me for this congestion feels like a hippo sitting on my chest and when I cough it hurts so bad I took my antibiotics but it seems like there not working      [employer], please see above mychart message from pt and advise.

## 2022-01-05 NOTE — Telephone Encounter (Signed)
Pt has allergy to penicillin. Please advise if you still want to do the augmentin.

## 2022-01-06 ENCOUNTER — Encounter: Payer: Self-pay | Admitting: Family

## 2022-01-06 LAB — NOVEL CORONAVIRUS, NAA: SARS-CoV-2, NAA: DETECTED — AB

## 2022-01-06 LAB — SARS-COV-2, NAA 2 DAY TAT

## 2022-01-08 ENCOUNTER — Other Ambulatory Visit: Payer: Self-pay

## 2022-01-08 ENCOUNTER — Encounter: Payer: Self-pay | Admitting: Family

## 2022-01-08 ENCOUNTER — Ambulatory Visit (INDEPENDENT_AMBULATORY_CARE_PROVIDER_SITE_OTHER): Payer: 59 | Admitting: Family

## 2022-01-08 DIAGNOSIS — M5441 Lumbago with sciatica, right side: Secondary | ICD-10-CM

## 2022-01-08 DIAGNOSIS — U071 COVID-19: Secondary | ICD-10-CM | POA: Diagnosis not present

## 2022-01-08 LAB — CULTURE, GROUP A STREP (THRC)

## 2022-01-08 MED ORDER — BENZONATATE 100 MG PO CAPS: 100.0000 mg | ORAL_CAPSULE | Freq: Three times a day (TID) | ORAL | 0 refills | Status: DC | PRN

## 2022-01-08 NOTE — Telephone Encounter (Signed)
Patient has appointment today at 1420.

## 2022-01-08 NOTE — Progress Notes (Signed)
Pt presents for telemedicine visit tested Friday 1/13 positive for COVID, symptoms include generalized body aches and pain and congestion

## 2022-01-09 ENCOUNTER — Encounter: Payer: Self-pay | Admitting: Family

## 2022-01-09 ENCOUNTER — Other Ambulatory Visit: Payer: Self-pay | Admitting: Family

## 2022-01-09 ENCOUNTER — Telehealth: Payer: 59 | Admitting: Family

## 2022-01-09 DIAGNOSIS — R110 Nausea: Secondary | ICD-10-CM

## 2022-01-09 MED ORDER — ONDANSETRON 4 MG PO TBDP: 4.0000 mg | ORAL_TABLET | Freq: Three times a day (TID) | ORAL | 0 refills | Status: DC | PRN

## 2022-01-10 ENCOUNTER — Other Ambulatory Visit: Payer: Self-pay | Admitting: Family

## 2022-01-10 ENCOUNTER — Telehealth: Payer: Self-pay

## 2022-01-10 NOTE — Telephone Encounter (Signed)
Work note sent through mychart 

## 2022-01-11 ENCOUNTER — Other Ambulatory Visit: Payer: Self-pay | Admitting: Primary Care

## 2022-01-12 ENCOUNTER — Other Ambulatory Visit: Payer: Self-pay

## 2022-01-12 DIAGNOSIS — R000 Tachycardia, unspecified: Secondary | ICD-10-CM

## 2022-01-12 MED ORDER — METOPROLOL SUCCINATE ER 25 MG PO TB24: ORAL_TABLET | ORAL | 0 refills | Status: DC

## 2022-01-16 ENCOUNTER — Other Ambulatory Visit: Payer: Self-pay | Admitting: Family

## 2022-01-16 DIAGNOSIS — U071 COVID-19: Secondary | ICD-10-CM

## 2022-01-17 ENCOUNTER — Encounter: Payer: Self-pay | Admitting: Family

## 2022-01-18 ENCOUNTER — Other Ambulatory Visit: Payer: Self-pay | Admitting: Family

## 2022-01-18 DIAGNOSIS — R059 Cough, unspecified: Secondary | ICD-10-CM

## 2022-01-18 MED ORDER — BENZONATATE 100 MG PO CAPS: 100.0000 mg | ORAL_CAPSULE | Freq: Three times a day (TID) | ORAL | 1 refills | Status: DC | PRN

## 2022-01-19 ENCOUNTER — Encounter (HOSPITAL_COMMUNITY): Payer: Self-pay | Admitting: Psychiatry

## 2022-01-19 ENCOUNTER — Telehealth (INDEPENDENT_AMBULATORY_CARE_PROVIDER_SITE_OTHER): Payer: 59 | Admitting: Psychiatry

## 2022-01-19 DIAGNOSIS — F410 Panic disorder [episodic paroxysmal anxiety] without agoraphobia: Secondary | ICD-10-CM

## 2022-01-19 DIAGNOSIS — F411 Generalized anxiety disorder: Secondary | ICD-10-CM

## 2022-01-19 DIAGNOSIS — F331 Major depressive disorder, recurrent, moderate: Secondary | ICD-10-CM | POA: Diagnosis not present

## 2022-01-19 MED ORDER — BUSPIRONE HCL 5 MG PO TABS: 5.0000 mg | ORAL_TABLET | Freq: Two times a day (BID) | ORAL | 1 refills | Status: AC

## 2022-01-19 MED ORDER — SERTRALINE HCL 100 MG PO TABS: 100.0000 mg | ORAL_TABLET | Freq: Every day | ORAL | 1 refills | Status: DC
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
## 2022-01-23 ENCOUNTER — Encounter (HOSPITAL_COMMUNITY): Payer: Self-pay

## 2022-01-23 ENCOUNTER — Ambulatory Visit (HOSPITAL_COMMUNITY): Payer: 59 | Admitting: Licensed Clinical Social Worker

## 2022-01-23 ENCOUNTER — Telehealth: Payer: Self-pay | Admitting: Orthopaedic Surgery

## 2022-01-23 NOTE — Progress Notes (Signed)
Therapist contacted patient for session through My Chart and she did not respond. Session is a no show.  

## 2022-01-23 NOTE — Telephone Encounter (Signed)
Received vm from [facility] office of Devona Konig Farrin checking on records request. IC,lmvm advised request process on 12/16 by [employer]. Advised if needs records sent again to call [employer] and left theit number. Ph  [phone]

## 2022-01-24 DIAGNOSIS — U071 COVID-19: Secondary | ICD-10-CM

## 2022-01-24 HISTORY — DX: COVID-19: U07.1

## 2022-01-26 ENCOUNTER — Other Ambulatory Visit: Payer: Self-pay | Admitting: Pulmonary Disease

## 2022-01-29 MED ORDER — ZOLPIDEM TARTRATE ER 12.5 MG PO TBCR: 12.5000 mg | EXTENDED_RELEASE_TABLET | Freq: Every evening | ORAL | 1 refills | Status: DC | PRN

## 2022-01-29 NOTE — Telephone Encounter (Signed)
Ambien refilled with one additional refill.

## 2022-01-29 NOTE — Telephone Encounter (Signed)
Mychart message sent by pt: [facility] Pool (supporting Glenford Bayley, NP) 2 hours ago (9:22 AM)   Can you refill my sleeping medicine please it won't let me through the chart    With Dr. Val Eagle being out, sending this to provider of the day. Dr. Everardo All, please advise.

## 2022-01-31 ENCOUNTER — Ambulatory Visit (INDEPENDENT_AMBULATORY_CARE_PROVIDER_SITE_OTHER): Payer: 59 | Admitting: Obstetrics & Gynecology

## 2022-01-31 ENCOUNTER — Encounter: Payer: Self-pay | Admitting: Obstetrics & Gynecology

## 2022-01-31 ENCOUNTER — Other Ambulatory Visit: Payer: Self-pay

## 2022-01-31 VITALS — BP 144/89 | HR 133 | Resp 16 | Ht 64.0 in | Wt 319.0 lb

## 2022-01-31 DIAGNOSIS — N939 Abnormal uterine and vaginal bleeding, unspecified: Secondary | ICD-10-CM | POA: Diagnosis not present

## 2022-01-31 NOTE — Progress Notes (Signed)
History:  31 y.o. G0P0000 here today for consult for operative management of AUB. Pt reports many years of abnormal bleeding. She reports that she has never been pregnant and she and her husband are not ready to conceive 'now'. She says that she does not think she wants to be pregnant in the future and really wants to focus on her bleeding. She has asthma and was intubated 2 years prev. Pt does not smoke tob but, her husband does.      Pt has many years of abnormal uterine bleeding with menorrhagia and dysmenorrhea.  Patient has heavy bleeding for almost half a month she will have 5 to 6 days of heavy bleeding, and a couple of days of no periods, with return to heavy bleeding.  She might go 4 to 5 days without bleeding with a few light days in between.  She has to change her pad every hour and a half or so.  Pads are saturated.  Advil and Tylenol doing nothing to help with dysmenorrhea.  She does use ibuprofen or Midol and heating pad.  She has used hormonal means for control but, this has caused her HAs.    About 10 years ago she had an ultrasound done and was told she had endometriosis and fibroid tumors.  She has gone through different COC's in order to try to help with bleeding.  These have included Tri-Cyclen Lo, Loestrin, Sprintec, Yasmin, Tri-Sprintec.  None of these have helped.  Her cramps are very consistent with pain that is very severe to the point that makes her nauseated and almost vomit.   The patient has been told that she is prediabetic.   The following portions of the patient's history were reviewed and updated as appropriate: allergies, current medications, past family history, past medical history, past social history, past surgical history and problem list.  Review of Systems:  Pertinent items are noted in HPI.    Objective:  Physical Exam Blood pressure (!) 144/89, pulse (!) 133, resp. rate 16, height 5\' 4"  (1.626 m), weight (!) 319 lb (144.7 kg), last menstrual period  01/02/2022.  CONSTITUTIONAL: Well-developed, well-nourished female in no acute distress.  HENT:  Normocephalic, atraumatic EYES: Conjunctivae and EOM are normal. No scleral icterus.  NECK: Normal range of motion SKIN: Skin is warm and dry. No rash noted. Not diaphoretic.No pallor. NEUROLGIC: Alert and oriented to person, place, and time. Normal coordination.  Pelvic: not done   Labs and Imaging 12/19/2021 CLINICAL DATA:  Abnormal uterine bleeding.   EXAM: TRANSABDOMINAL AND TRANSVAGINAL ULTRASOUND OF PELVIS   TECHNIQUE: Both transabdominal and transvaginal ultrasound examinations of the pelvis were performed. Transabdominal technique was performed for global imaging of the pelvis including uterus, ovaries, adnexal regions, and pelvic cul-de-sac. It was necessary to proceed with endovaginal exam following the transabdominal exam to visualize the endometrium and ovaries.   COMPARISON:  None   FINDINGS: Uterus   Measurements: 7.3 x 4.8 x 4.1 cm = volume: 75 mL. 2 cm fibroid is noted in anterior portion of uterus.   Endometrium   Thickness: 6 mm which is within normal limits. No focal abnormality visualized.   Right ovary   Measurements: 2.4 x 2.5 x 2.2 cm = volume: 7 mL. Normal appearance/no adnexal mass.   Left ovary   Measurements: 3.0 x 2.8 x 2.5 cm = volume: 11 mL. Normal appearance/no adnexal mass.   Other findings   No abnormal free fluid.   IMPRESSION: 2 cm uterine fibroid is noted. No  other definite abnormality is noted in the pelvis.   If bleeding remains unresponsive to hormonal or medical therapy, sonohysterogram should be considered for focal lesion work-up. (Ref: Radiological Reasoning: Algorithmic Workup of Abnormal Vaginal Bleeding with Endovaginal Sonography and Sonohysterography. AJR 2008; 875:I43-32)    Assessment & Plan:  Patient desires surgical management with hysteroscopy with D&C and endometrial ablation using Novasure.  The risks of  surgery were discussed in detail with the patient including but not limited to: bleeding which may require transfusion or reoperation; infection which may require prolonged hospitalization or re-hospitalization and antibiotic therapy; injury to bowel, bladder, ureters and major vessels or other surrounding organs; need for additional procedures including laparotomy; thromboembolic phenomenon, incisional problems and other postoperative or anesthesia complications.  Patient was told that the likelihood that her condition and symptoms will be treated effectively with this surgical management was very high; the postoperative expectations were also discussed in detail. The patient also understands the alternative treatment options which were discussed in full. All questions were answered.  She was told that she will be contacted by our surgical scheduler regarding the time and date of her surgery; routine preoperative instructions of having nothing to eat or drink after midnight on the day prior to surgery and also coming to the hospital 1 1/2 hours prior to her time of surgery were also emphasized.  She was told she may be called for a preoperative appointment about a week prior to surgery and will be given further preoperative instructions at that visit. Printed patient education handouts about the procedure were given to the patient to review at home.   Anaelle Dunton L. Harraway-Smith, M.D., [facility] L. Harraway-Smith, M.D., [facility]

## 2022-02-02 ENCOUNTER — Ambulatory Visit: Payer: 59

## 2022-02-02 ENCOUNTER — Other Ambulatory Visit: Payer: Self-pay

## 2022-02-02 DIAGNOSIS — R0683 Snoring: Secondary | ICD-10-CM

## 2022-02-02 DIAGNOSIS — G4733 Obstructive sleep apnea (adult) (pediatric): Secondary | ICD-10-CM | POA: Diagnosis not present

## 2022-02-06 ENCOUNTER — Ambulatory Visit (INDEPENDENT_AMBULATORY_CARE_PROVIDER_SITE_OTHER): Payer: 59 | Admitting: Licensed Clinical Social Worker

## 2022-02-06 ENCOUNTER — Encounter (HOSPITAL_COMMUNITY): Payer: Self-pay

## 2022-02-06 ENCOUNTER — Other Ambulatory Visit: Payer: Self-pay

## 2022-02-06 DIAGNOSIS — F411 Generalized anxiety disorder: Secondary | ICD-10-CM

## 2022-02-06 DIAGNOSIS — F410 Panic disorder [episodic paroxysmal anxiety] without agoraphobia: Secondary | ICD-10-CM

## 2022-02-06 DIAGNOSIS — F331 Major depressive disorder, recurrent, moderate: Secondary | ICD-10-CM

## 2022-02-06 DIAGNOSIS — F5102 Adjustment insomnia: Secondary | ICD-10-CM | POA: Diagnosis not present

## 2022-02-06 NOTE — Progress Notes (Signed)
°  Virtual Visit via Video Note  I connected with Tina Manning on 02/06/22 at  8:00 AM EST by a video enabled telemedicine application and verified that I am speaking with the correct person using two identifiers.  Location: Patient: stationary car Provider: office   I discussed the limitations of evaluation and management by telemedicine and the availability of in person appointments. The patient expressed understanding and agreed to proceed.   I discussed the assessment and treatment plan with the patient. The patient was provided an opportunity to ask questions and all were answered. The patient agreed with the plan and demonstrated an understanding of the instructions.   The patient was advised to call back or seek an in-person evaluation if the symptoms worsen or if the condition fails to improve as anticipated.  I provided 25 minutes of non-face-to-face time during this encounter.   THERAPIST PROGRESS NOTE  Session Time: 8:00 AM to 8:25 AM  Participation Level: Active  Behavioral Response: CasualAlert explains not feeling well related to recuperating from COVID  Type of Therapy: Individual Therapy  Treatment Goals addressed: Work on anxiety, specifically worry siding and crowds getting over her sister's death, depression, stress management, coping  ProgressTowards Goals: Initial patient just developed goals today  Interventions: Solution Focused, Strength-based, Supportive, and Other: Developing therapeutic rapport, coping  Summary: Tina Manning is a 31 y.o. female who presents with got COVID has been sick and now recuperating. A little anxiety because husband asked if she needs to go hospital and doesn't like to go the hospital able to manage at home. Worried that they going to intubate her.  Therapist validated her on this worry although you do not know what to happen so expect a lot of people being nervous about that.  Completed paperwork from assessment  noted patient has pain issues she has to deal with.  Completed treatment plan and patient gave consent to complete virtually.  Noted focus on anxiety.  Worked on developing therapeutic rapport therapist provided active listening open questions supportive interventions as patient is sick she wanted to keep the session short we will need to explore issues with sister's death as well as beginning education on anxiety  Suicidal/Homicidal: No  Plan: Return again in 2 weeks.2.  Provided psychoeducation on anxiety, explore issues with sister's death  Diagnosis: Major depressive disorder, recurrent, moderate generalized anxiety disorder, panic attacks, adjustment insomnia   Coolidge Breeze, LCSW 02/06/2022

## 2022-02-06 NOTE — Plan of Care (Signed)
Patient participated in treatment plan °

## 2022-02-08 ENCOUNTER — Encounter: Payer: Self-pay | Admitting: Obstetrics & Gynecology

## 2022-02-09 DIAGNOSIS — G4733 Obstructive sleep apnea (adult) (pediatric): Secondary | ICD-10-CM | POA: Diagnosis not present

## 2022-02-09 NOTE — Telephone Encounter (Signed)
Beth, please see pt's email. She is requesting the results of her home sleep study. Thank you! °

## 2022-02-14 ENCOUNTER — Ambulatory Visit (INDEPENDENT_AMBULATORY_CARE_PROVIDER_SITE_OTHER): Payer: 59 | Admitting: Obstetrics & Gynecology

## 2022-02-14 ENCOUNTER — Encounter: Payer: Self-pay | Admitting: Obstetrics & Gynecology

## 2022-02-14 ENCOUNTER — Other Ambulatory Visit: Payer: Self-pay

## 2022-02-14 VITALS — BP 144/97 | HR 133 | Ht 64.0 in | Wt 325.0 lb

## 2022-02-14 DIAGNOSIS — Z6841 Body Mass Index (BMI) 40.0 and over, adult: Secondary | ICD-10-CM | POA: Diagnosis not present

## 2022-02-14 DIAGNOSIS — N939 Abnormal uterine and vaginal bleeding, unspecified: Secondary | ICD-10-CM

## 2022-02-14 NOTE — Progress Notes (Signed)
History:  31 y.o. G0P0000 here today for to discuss surgical options. Pt reports that she would like to proceed with hysterectomy in lieu of endometrial ablation. Pt reports that she has asthma and cannot exercise. She works from [employer]. She reports that she works at [employer] and she mainly eats at work. No changes in her GYN sx.        The following portions of the patient's history were reviewed and updated as appropriate: allergies, current medications, past family history, past medical history, past social history, past surgical history and problem list.  Review of Systems:  Pertinent items are noted in HPI.    Objective:  Physical Exam Blood pressure (!) 144/97, pulse (!) 133, height 5\' 4"  (1.626 m), weight (!) 325 lb (147.4 kg).  CONSTITUTIONAL: Well-developed, well-nourished female in no acute distress.  HENT:  Normocephalic, atraumatic EYES: Conjunctivae and EOM are normal. No scleral icterus.  NECK: Normal range of motion SKIN: Skin is warm and dry. No rash noted. Not diaphoretic.No pallor. NEUROLGIC: Alert and oriented to person, place, and time. Normal coordination.  Abd: Soft, nontender and nondistended; obese BMI >55.  Pelvic: Normal appearing external genitalia; very narrow introitus with limited decensus; Small uterus, no other palpable masses, no uterine or adnexal tenderness  Labs and Imaging 12/19/2021 CLINICAL DATA:  Abnormal uterine bleeding.   EXAM: TRANSABDOMINAL AND TRANSVAGINAL ULTRASOUND OF PELVIS   TECHNIQUE: Both transabdominal and transvaginal ultrasound examinations of the pelvis were performed. Transabdominal technique was performed for global imaging of the pelvis including uterus, ovaries, adnexal regions, and pelvic cul-de-sac. It was necessary to proceed with endovaginal exam following the transabdominal exam to visualize the endometrium and ovaries.   COMPARISON:  None   FINDINGS: Uterus   Measurements: 7.3 x 4.8 x 4.1 cm = volume:  75 mL. 2 cm fibroid is noted in anterior portion of uterus.   Endometrium   Thickness: 6 mm which is within normal limits. No focal abnormality visualized.   Right ovary   Measurements: 2.4 x 2.5 x 2.2 cm = volume: 7 mL. Normal appearance/no adnexal mass.   Left ovary   Measurements: 3.0 x 2.8 x 2.5 cm = volume: 11 mL. Normal appearance/no adnexal mass.   Other findings   No abnormal free fluid.   IMPRESSION: 2 cm uterine fibroid is noted. No other definite abnormality is noted in the pelvis.   If bleeding remains unresponsive to hormonal or medical therapy, sonohysterogram should be considered for focal lesion work-up. (Ref: Radiological Reasoning: Algorithmic Workup of Abnormal Vaginal Bleeding with Endovaginal Sonography and Sonohysterography. AJR 200806-08-2000)    Assessment & Plan:  AUB. Pt is scheduled for a hysteroscopy with endometrial ablation. I have reiterated to her the risks of abd surgery I am concerned about doing a more invasive procedure when we have not considered the less invasive options. I am concerned about her weight, asthma and lack of exercise tolerance.  After review of my concerns and recommendation to work on getting healthier, pt agrees that starting with a hysteroscopy with ablation followed by a hysterectomy, if needed, as her health improves.   Pt agrees to meet with nutritionist.  Reviewed some healthy eating options even at ; [id number].     Total face-to-face time with patient, review of chart, discussion with consultant and coordination of care was [employer].    Amon Costilla L. Harraway-Smith, M.D., 

## 2022-02-16 ENCOUNTER — Telehealth (HOSPITAL_COMMUNITY): Payer: 59 | Admitting: Psychiatry

## 2022-02-16 NOTE — Telephone Encounter (Signed)
Beth, please see pt's email. She is requesting the results of her home sleep study. Thank you!

## 2022-02-16 NOTE — Telephone Encounter (Signed)
Pt inquiring again about HST results. Please advise.

## 2022-02-19 ENCOUNTER — Telehealth (HOSPITAL_COMMUNITY): Payer: 59 | Admitting: Psychiatry

## 2022-02-19 ENCOUNTER — Ambulatory Visit: Payer: 59 | Admitting: Internal Medicine

## 2022-02-19 ENCOUNTER — Encounter (HOSPITAL_COMMUNITY): Payer: Self-pay

## 2022-02-20 ENCOUNTER — Ambulatory Visit (HOSPITAL_COMMUNITY): Payer: 59 | Admitting: Licensed Clinical Social Worker

## 2022-02-20 NOTE — Progress Notes (Signed)
Therapist contacted patient through My Chart and she did not respond session is a no show 

## 2022-02-21 ENCOUNTER — Telehealth: Payer: Self-pay | Admitting: Internal Medicine

## 2022-02-21 ENCOUNTER — Ambulatory Visit: Payer: 59 | Admitting: Physician Assistant

## 2022-02-21 DIAGNOSIS — J4540 Moderate persistent asthma, uncomplicated: Secondary | ICD-10-CM

## 2022-02-21 MED ORDER — ALBUTEROL SULFATE HFA 108 (90 BASE) MCG/ACT IN AERS: INHALATION_SPRAY | RESPIRATORY_TRACT | 5 refills | Status: DC

## 2022-02-21 NOTE — Telephone Encounter (Signed)
Rx for pt's albuterol inhaler has been sent to preferred pharmacy for pt. Called and spoke with pt letting her know this had been done and she verbalized understanding. Nothing further needed. 

## 2022-02-21 NOTE — Telephone Encounter (Signed)
I had wanted her to scheduled 4-6 week follow-up to review results. HST showed mild OSA, she had on average 7 apneic events an hour. Conservative management would be with weight loss and focusing on side sleeping position. If she wants to pursue oral appliance or CPAP needs visit to discuss

## 2022-02-26 ENCOUNTER — Ambulatory Visit: Payer: 59 | Admitting: Internal Medicine

## 2022-02-27 ENCOUNTER — Other Ambulatory Visit: Payer: Self-pay

## 2022-02-27 ENCOUNTER — Ambulatory Visit (INDEPENDENT_AMBULATORY_CARE_PROVIDER_SITE_OTHER): Payer: 59 | Admitting: Internal Medicine

## 2022-02-27 ENCOUNTER — Encounter: Payer: Self-pay | Admitting: Internal Medicine

## 2022-02-27 DIAGNOSIS — J45909 Unspecified asthma, uncomplicated: Secondary | ICD-10-CM | POA: Diagnosis not present

## 2022-02-27 DIAGNOSIS — R0683 Snoring: Secondary | ICD-10-CM

## 2022-02-27 MED ORDER — PREDNISONE 10 MG PO TABS: ORAL_TABLET | ORAL | 0 refills | Status: DC

## 2022-02-27 NOTE — Progress Notes (Signed)
Tina Manning, female    DOB: 20-Mar-1991   MRN: [id number]   Brief patient profile:  30 yowf never smoker with childhood asthma typically needing saba  while living in [place] where became more of a chronic issue but still only on saba as "maint" and required  intubation x 10 days  Thanksgiving 2021 at [facility] referred to pulmonary clinic 08/15/2021 by Tina Stabs, NP for asthma.  Prior to the chronic asthma problem in 2020 was maintaining  wt  around 230    History of Present Illness  08/15/2021  Pulmonary/ 1st office eval/Tina Manning no maint rx  since Feb 2022 arrived in [place] Chief Complaint  Patient presents with   Consult    Patient reports increased shortness of breath in last 2 months at rest and with exertion.   Dyspnea:  can finish [employer] grocery store s stopping  Cough: none Sleep: 45 degrees x sev years SABA use: saba hfa avg 4-5 x per day/finished pred x 5 days prior to OV  - last dose 3.5 h prior to OV   Rec Plan A = Automatic = Always=  Dulera 200 (or Breztri) Take 2 puffs first thing in am and then another 2 puffs about 12 hours later.  And take Pantoprazole (protonix) 40 mg   Take  30-60 min before first meal of the day and Pepcid (famotidine)  20 mg after supper    Work on inhaler technique:    Plan B = Backup (to supplement plan A, not to replace it) Only use your albuterol inhaler as a rescue medication Plan C = Crisis (instead of Plan B but only if Plan B stops working) - only use your albuterol nebulizer if you first try Plan B and it fails to help > ok to use the nebulizer up to every 4 hours but if start needing it regularly call for immediate appointment Also ok to Try albuterol 15 min before an activity (on alternating days)  that you know would make you short of breath and see if it makes any difference and if makes none then don't take albuterol after activity unless you can't catch your breath as this means it's the resting that helps, not the  albuterol. Depomedrol 120 mg IM  GERD diet reviewed, bed blocks rec   Singulair added by NP  09/12/21 with Allergy profile    Eos 0.2/  IgE  482   11/07/2021  f/u ov/Tina Manning re: chronic asthma  maint on dulera  200 bid due to ha  Chief Complaint  Patient presents with   Follow-up    Upper left back pain with inspiration last pm.  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  Cough: none  Sleeping: 45 degrees electric = baseline  SABA use: avg  1-2 pffs per week until 11/14 pm  plus neb this am  02: none  Covid status:   vax x 2  Cp with dep breath acute onset pm prior to ov  New pain L lat thigh also but no calf pain or sweeling  Rec Plan A = Automatic = Always=    Dulera 1-2 pffs every 12 hours  Plan B = Backup (to supplement plan A, not to replace it) Only use your albuterol inhaler as a rescue medication  Plan C = Crisis (instead of Plan B but only if Plan B stops working) - only use your albuterol nebulizer  if you first try Plan B  Flu shot today    02/27/2022  f/u ov/Tina Manning re:  asthma  maint on dulera 200 2bid /singulair  No chief complaint on file.   Dyspnea:  walks x 30 min slow pace, stops at top of hills typically  Cough: dry some sense of pnds / nasal stuffiness  Sleeping: flat bed/ 3 pillows  SABA use: using nebs in am just last few days prior to OV   02: none  Covid status:   vax x 2    No obvious day to day or daytime variability or assoc excess/ purulent sputum or mucus plugs or hemoptysis or cp or chest tightness, subjective wheeze or overt sinus or hb symptoms.   Sleeping  without nocturnal  or early am exacerbation  of respiratory  c/o's or need for noct saba. Also denies any obvious fluctuation of symptoms with weather or environmental changes or other aggravating or alleviating factors except as outlined above   No unusual exposure hx or h/o childhood pna/ asthma or knowledge of premature  birth.  Current Allergies, Complete Past Medical History, Past Surgical History, Family History, and Social History were reviewed in [employer] record.  ROS  The following are not active complaints unless bolded Hoarseness, sore throat, dysphagia, dental problems, itching, sneezing,  nasal congestion or discharge of excess mucus or purulent secretions, ear ache,   fever, chills, sweats, unintended wt loss or wt gain, classically pleuritic or exertional cp,  orthopnea pnd or arm/hand swelling  or leg swelling, presyncope, palpitations, abdominal pain, anorexia, nausea, vomiting, diarrhea  or change in bowel habits or change in bladder habits, change in stools or change in urine, dysuria, hematuria,  rash, arthralgias, visual complaints, headache, numbness, weakness or ataxia or problems with walking or coordination,  change in mood or  memory.             Past Medical History:  Diagnosis Date   Anemia    Asthma    Complication of anesthesia    Diabetes mellitus without complication (HCC)    Endometriosis    PONV (postoperative nausea and vomiting)    Tachycardia        Objective:    wts   02/27/2022            320  11/07/2021        310   09/13/21 (!) 306 lb (138.8 kg)  09/12/21 (!) 308 lb (139.7 kg)  08/15/21 (!) 311 lb (141.1 kg)    Vital signs reviewed  02/27/2022  - Note at rest 02 sats  97% on RA   General appearance:    obese amb wf nad    HEENT : pt wearing mask not removed for exam due to covid -19 concerns.    NECK :  without JVD/Nodes/TM/ nl carotid upstrokes bilaterally   LUNGS: no acc muscle use,  Nl contour chest with insp /exp wheezing  bilaterally without cough on insp or exp maneuvers   CV:  RRR  no s3 or murmur or increase in P2, and no edema   ABD:  quite obese bit soft and nontender with limited inspiratory excursion in the supine position. No bruits or organomegaly appreciated, bowel sounds nl  MS:  Nl gait/ ext warm without  deformities, calf tenderness, cyanosis or clubbing No obvious joint restrictions   SKIN: warm and dry without lesions    NEURO:  alert, approp, nl sensorium with  no motor or cerebellar deficits  apparent.       I personally reviewed images and agree with radiology impression as follows:  CXR:   pa and lateral 01/05/22  No active cardiopulmonary disease.       Assessment     Outpatient Encounter Medications as of 02/27/2022  Medication Sig   predniSONE (DELTASONE) 10 MG tablet Take  4 each am x 2 days,   2 each am x 2 days,  1 each am x 2 days and stop   albuterol (VENTOLIN HFA) 108 (90 Base) MCG/ACT inhaler INHALE 1 TO 2 PUFFS INTO THE LUNGS EVERY 6 HOURS AS NEEDED FOR WHEEZING OR SHORTNESS OF BREATH   atorvastatin (LIPITOR) 40 MG tablet TAKE 1 TABLET(40 MG) BY MOUTH DAILY   busPIRone (BUSPAR) 5 MG tablet Take 1 tablet (5 mg total) by mouth 2 (two) times daily.   hydrOXYzine (VISTARIL) 50 MG capsule Take 1 capsule (50 mg total) by mouth every 8 (eight) hours as needed.   ipratropium-albuterol (DUONEB) 0.5-2.5 (3) MG/3ML SOLN Take 3 mLs by nebulization in the morning, at noon, in the evening, and at bedtime.   metFORMIN (GLUCOPHAGE) 500 MG tablet Take 1 tablet (500 mg total) by mouth 2 (two) times daily with a meal.   metoprolol succinate (TOPROL-XL) 25 MG 24 hr tablet TAKE 1 TABLET(25 MG) BY MOUTH DAILY   mometasone-formoterol (DULERA) 200-5 MCG/ACT AERO Take 2 puffs first thing in am and then another 2 puffs about 12 hours later.   montelukast (SINGULAIR) 10 MG tablet Take 1 tablet (10 mg total) by mouth at bedtime.   ondansetron (ZOFRAN-ODT) 4 MG disintegrating tablet Take 1 tablet (4 mg total) by mouth every 8 (eight) hours as needed for nausea or vomiting.   pantoprazole (PROTONIX) 40 MG tablet TAKE 1 TABLET(40 MG) BY MOUTH DAILY   sertraline (ZOLOFT) 100 MG tablet Take 1 tablet (100 mg total) by mouth daily.   traMADol (ULTRAM) 50 MG tablet Take 2 tablets (100 mg total) by mouth  every 6 (six) hours as needed for severe pain.   zolpidem (AMBIEN CR) 12.5 MG CR tablet Take 1 tablet (12.5 mg total) by mouth at bedtime as needed for sleep.   No facility-administered encounter medications on file as of 02/27/2022.

## 2022-02-27 NOTE — Assessment & Plan Note (Signed)
T  02/12/22  AHI  7.1 with lowest sat 89% > rx conservatively with wt loss/ off back  ? ?Etiology and pathophysiology of osa including relationship to obesity reviewed in detail   ? ? ?    ?  ? ?Each maintenance medication was reviewed in detail including emphasizing most importantly the difference between maintenance and prns and under what circumstances the prns are to be triggered using an action plan format where appropriate. ? ?Total time for H and P, chart review, counseling, reviewing hfa device(s) and generating customized  > 30 min ?     ?

## 2022-02-27 NOTE — Patient Instructions (Signed)
No change in your regular medications ? ?Plan A = Automatic = Always=    dulera 200 Take 2 puffs first thing in am and then another 2 puffs about 12 hours later. And singulair each pm ? ?Plan B = Backup (to supplement plan A, not to replace it) ?Only use your albuterol inhaler as a rescue medication to be used if you can't catch your breath by resting or doing a relaxed purse lip breathing pattern.  ?- The less you use it, the better it will work when you need it. ?- Ok to use the inhaler up to 2 puffs  every 4 hours if you must but call for appointment if use goes up over your usual need ?- Don't leave home without it !!  (think of it like the spare tire for your car)  ? ?Plan C = Crisis (instead of Plan B but only if Plan B stops working) ?- only use your albuterol nebulizer if you first try Plan B and it fails to help > ok to use the nebulizer up to every 4 hours but if start needing it regularly call for immediate appointment ? ?Prednisone 10 mg take  4 each am x 2 days,   2 each am x 2 days,  1 each am x 2 days and stop  ? ?I have referred you to allergy and will see you back here in meantime if needed for worse breathing  but once established with allergy they can refill your medications ? ?. ?

## 2022-02-27 NOTE — Assessment & Plan Note (Addendum)
Wt around 230 prior to chronic asthma 2020 ? ?Body mass index is 55.07 kg/m?.  -  trending up  ?Lab Results  ?Component Value Date  ? TSH 1.950 12/14/2021  ?  ? ? ?Contributing to doe and risk of GERD and OSA  ?>>>   reviewed the need and the process to achieve and maintain neg calorie balance > defer f/u primary care including intermittently monitoring thyroid status     ?

## 2022-02-27 NOTE — Assessment & Plan Note (Signed)
Onset age 31 ?- intubation [place] in Nov 2021 ?- 08/15/2021  After extensive coaching inhaler device,  effectiveness =   75% > try dulera 200 (breztri sample x one)  ?- Singulair added  09/12/21 with Allergy profile    Eos 0.2/  IgE  482 ?- 02/27/2022  After extensive coaching inhaler device,  effectiveness =    90% > continue dulera 200 and singulair and refer to allergy  ? ?I believe she is using laba/ics correctly and yet still having flares req prednisone rx so rec ? ?1) no change maint rx ?2) Prednisone 10 mg take  4 each am x 2 days,   2 each am x 2 days,  1 each am x 2 days and stop  ?3) referred to allergy today and f/u here prn in meantime  ?

## 2022-03-01 ENCOUNTER — Telehealth: Payer: Self-pay

## 2022-03-01 NOTE — Telephone Encounter (Signed)
Patient made aware of need to reschedule her surgery on April 4th. ? ?Patient chooses to have surgery Monday March 13 and given surgery time of 11am and she is to arrive at [facility] cone at [facility]. Patient states understanding and explained there will be more instructions sent to her my chart account from [facility] - surgery scheduler. Armandina Stammer RN ?

## 2022-03-02 ENCOUNTER — Telehealth: Payer: Self-pay | Admitting: Internal Medicine

## 2022-03-02 ENCOUNTER — Other Ambulatory Visit: Payer: Self-pay

## 2022-03-02 ENCOUNTER — Encounter (HOSPITAL_COMMUNITY): Payer: Self-pay | Admitting: Obstetrics & Gynecology

## 2022-03-02 NOTE — Telephone Encounter (Signed)
Called patient but she did not answer. Left message for her to call back.  

## 2022-03-02 NOTE — Progress Notes (Signed)
Spoke with pt for pre-op call. Pt states she has hx of a rapid heart rate, takes metoprolol. States she saw Dr. Flora Lipps (cardiologist) one time and was told to continue the Metoprolol and no need for any follow-up visit. Pt has recently been diagnosed with Pre-diabetes. Last A1C was 6.0 on 12/14/21 and started on Metformin.  ? ?Pt's surgery is scheduled as ambulatory so no Covid test is required prior to surgery. ? ?

## 2022-03-05 ENCOUNTER — Encounter (HOSPITAL_COMMUNITY)
Admission: RE | Disposition: A | Discharge status: Home / Self Care | Payer: Self-pay | Source: Home / Self Care | Attending: Obstetrics & Gynecology

## 2022-03-05 ENCOUNTER — Other Ambulatory Visit: Payer: Self-pay

## 2022-03-05 ENCOUNTER — Ambulatory Visit (HOSPITAL_BASED_OUTPATIENT_CLINIC_OR_DEPARTMENT_OTHER): Payer: 59 | Admitting: Anesthesiology

## 2022-03-05 ENCOUNTER — Ambulatory Visit (HOSPITAL_COMMUNITY)
Admission: RE | Admit: 2022-03-05 | Discharge: 2022-03-05 | Disposition: A | Discharge status: Home / Self Care | Payer: 59 | Attending: Obstetrics & Gynecology | Admitting: Obstetrics & Gynecology

## 2022-03-05 ENCOUNTER — Telehealth: Payer: Self-pay

## 2022-03-05 ENCOUNTER — Ambulatory Visit (HOSPITAL_COMMUNITY): Payer: 59 | Admitting: Anesthesiology

## 2022-03-05 ENCOUNTER — Encounter (HOSPITAL_COMMUNITY): Payer: Self-pay | Admitting: Obstetrics & Gynecology

## 2022-03-05 DIAGNOSIS — F418 Other specified anxiety disorders: Secondary | ICD-10-CM

## 2022-03-05 DIAGNOSIS — N939 Abnormal uterine and vaginal bleeding, unspecified: Secondary | ICD-10-CM | POA: Insufficient documentation

## 2022-03-05 DIAGNOSIS — I10 Essential (primary) hypertension: Secondary | ICD-10-CM | POA: Insufficient documentation

## 2022-03-05 DIAGNOSIS — G4730 Sleep apnea, unspecified: Secondary | ICD-10-CM | POA: Insufficient documentation

## 2022-03-05 DIAGNOSIS — J45909 Unspecified asthma, uncomplicated: Secondary | ICD-10-CM | POA: Diagnosis not present

## 2022-03-05 DIAGNOSIS — N938 Other specified abnormal uterine and vaginal bleeding: Secondary | ICD-10-CM

## 2022-03-05 DIAGNOSIS — R7303 Prediabetes: Secondary | ICD-10-CM | POA: Diagnosis not present

## 2022-03-05 DIAGNOSIS — R9389 Abnormal findings on diagnostic imaging of other specified body structures: Secondary | ICD-10-CM | POA: Diagnosis not present

## 2022-03-05 DIAGNOSIS — N840 Polyp of corpus uteri: Secondary | ICD-10-CM | POA: Insufficient documentation

## 2022-03-05 HISTORY — DX: Tachycardia, unspecified: R00.0

## 2022-03-05 HISTORY — DX: Gastro-esophageal reflux disease without esophagitis: K21.9

## 2022-03-05 HISTORY — DX: Personal history of urinary calculi: Z87.442

## 2022-03-05 HISTORY — DX: Anxiety disorder, unspecified: F41.9

## 2022-03-05 HISTORY — DX: Pneumonia, unspecified organism: J18.9

## 2022-03-05 HISTORY — DX: Attention-deficit hyperactivity disorder, unspecified type: F90.9

## 2022-03-05 HISTORY — DX: Prediabetes: R73.03

## 2022-03-05 LAB — BASIC METABOLIC PANEL
Anion gap: 9 (ref 5–15)
BUN: 8 mg/dL (ref 6–20)
CO2: 23 mmol/L (ref 22–32)
Calcium: 9.1 mg/dL (ref 8.9–10.3)
Chloride: 105 mmol/L (ref 98–111)
Creatinine, Ser: 0.8 mg/dL (ref 0.44–1.00)
GFR, Estimated: >60 mL/min (ref >60)
Glucose, Bld: 90 mg/dL (ref 70–99)
Potassium: 4.1 mmol/L (ref 3.5–5.1)
Sodium: 137 mmol/L (ref 135–145)

## 2022-03-05 LAB — CBC
HCT: 39.4 % (ref 36.0–46.0)
Hemoglobin: 12.3 g/dL (ref 12.0–15.0)
MCH: 25.1 pg — ABNORMAL LOW (ref 26.0–34.0)
MCHC: 31.2 g/dL (ref 30.0–36.0)
MCV: 80.4 fL (ref 80.0–100.0)
Platelets: 431 10*3/uL — ABNORMAL HIGH (ref 150–400)
RBC: 4.9 MIL/uL (ref 3.87–5.11)
RDW: 16.7 % — ABNORMAL HIGH (ref 11.5–15.5)
WBC: 11 10*3/uL — ABNORMAL HIGH (ref 4.0–10.5)
nRBC: 0 % (ref 0.0–0.2)

## 2022-03-05 LAB — POCT PREGNANCY, URINE: Preg Test, Ur: NEGATIVE

## 2022-03-05 SURGERY — DILATATION & CURETTAGE/HYSTEROSCOPY WITH NOVASURE ABLATION
Anesthesia: General | Site: Uterus

## 2022-03-05 MED ORDER — ACETAMINOPHEN 500 MG PO TABS
1000.0000 mg | ORAL_TABLET | Freq: Once | ORAL | Status: AC
  Administered 2022-03-05: 1000 mg via ORAL
  Filled 2022-03-05: qty 2

## 2022-03-05 MED ORDER — ONDANSETRON HCL 4 MG/2ML IJ SOLN
INTRAMUSCULAR | Status: AC
  Filled 2022-03-05: qty 2

## 2022-03-05 MED ORDER — MIDAZOLAM HCL 2 MG/2ML IJ SOLN
INTRAMUSCULAR | Status: AC
  Filled 2022-03-05: qty 2

## 2022-03-05 MED ORDER — IPRATROPIUM-ALBUTEROL 0.5-2.5 (3) MG/3ML IN SOLN
RESPIRATORY_TRACT | Status: AC
  Filled 2022-03-05: qty 3

## 2022-03-05 MED ORDER — DEXAMETHASONE SODIUM PHOSPHATE 10 MG/ML IJ SOLN
INTRAMUSCULAR | Status: AC
  Filled 2022-03-05: qty 1

## 2022-03-05 MED ORDER — FENTANYL CITRATE (PF) 100 MCG/2ML IJ SOLN
25.0000 ug | INTRAMUSCULAR | Status: DC | PRN
  Administered 2022-03-05 (×2): 50 ug via INTRAVENOUS

## 2022-03-05 MED ORDER — DEXAMETHASONE SODIUM PHOSPHATE 10 MG/ML IJ SOLN
INTRAMUSCULAR | Status: DC | PRN
Start: 2022-03-05 — End: 2022-03-05
  Administered 2022-03-05: 10 mg via INTRAVENOUS

## 2022-03-05 MED ORDER — FENTANYL CITRATE (PF) 250 MCG/5ML IJ SOLN
INTRAMUSCULAR | Status: DC | PRN
  Administered 2022-03-05: 100 ug via INTRAVENOUS

## 2022-03-05 MED ORDER — SUCCINYLCHOLINE CHLORIDE 200 MG/10ML IV SOSY
PREFILLED_SYRINGE | INTRAVENOUS | Status: DC | PRN
Start: 2022-03-05 — End: 2022-03-05
  Administered 2022-03-05: 160 mg via INTRAVENOUS

## 2022-03-05 MED ORDER — ONDANSETRON HCL 4 MG/2ML IJ SOLN
INTRAMUSCULAR | Status: DC | PRN
  Administered 2022-03-05: 4 mg via INTRAVENOUS

## 2022-03-05 MED ORDER — EPHEDRINE 5 MG/ML INJ
INTRAVENOUS | Status: AC
  Filled 2022-03-05: qty 5

## 2022-03-05 MED ORDER — LACTATED RINGERS IV SOLN: INTRAVENOUS | Status: DC

## 2022-03-05 MED ORDER — OXYCODONE HCL 5 MG/5ML PO SOLN: 5.0000 mg | Freq: Once | ORAL | Status: DC | PRN

## 2022-03-05 MED ORDER — PHENYLEPHRINE 40 MCG/ML (10ML) SYRINGE FOR IV PUSH (FOR BLOOD PRESSURE SUPPORT)
PREFILLED_SYRINGE | INTRAVENOUS | Status: AC
  Filled 2022-03-05: qty 20

## 2022-03-05 MED ORDER — LIDOCAINE 2% (20 MG/ML) 5 ML SYRINGE
INTRAMUSCULAR | Status: DC | PRN
Start: 2022-03-05 — End: 2022-03-05
  Administered 2022-03-05: 80 mg via INTRAVENOUS

## 2022-03-05 MED ORDER — SCOPOLAMINE 1 MG/3DAYS TD PT72
1.0000 | MEDICATED_PATCH | TRANSDERMAL | Status: DC
  Administered 2022-03-05: 1.5 mg via TRANSDERMAL
  Filled 2022-03-05: qty 1

## 2022-03-05 MED ORDER — FENTANYL CITRATE (PF) 250 MCG/5ML IJ SOLN
INTRAMUSCULAR | Status: AC
  Filled 2022-03-05: qty 5

## 2022-03-05 MED ORDER — FENTANYL CITRATE (PF) 100 MCG/2ML IJ SOLN
INTRAMUSCULAR | Status: AC
  Filled 2022-03-05: qty 2

## 2022-03-05 MED ORDER — ORAL CARE MOUTH RINSE: 15.0000 mL | Freq: Once | OROMUCOSAL | Status: AC

## 2022-03-05 MED ORDER — ONDANSETRON HCL 4 MG/2ML IJ SOLN: 4.0000 mg | Freq: Once | INTRAMUSCULAR | Status: DC | PRN

## 2022-03-05 MED ORDER — SODIUM CHLORIDE 0.9 % IR SOLN
Status: DC | PRN
  Administered 2022-03-05: 3000 mL

## 2022-03-05 MED ORDER — ROCURONIUM BROMIDE 10 MG/ML (PF) SYRINGE
PREFILLED_SYRINGE | INTRAVENOUS | Status: AC
  Filled 2022-03-05: qty 10

## 2022-03-05 MED ORDER — MIDAZOLAM HCL 2 MG/2ML IJ SOLN
INTRAMUSCULAR | Status: DC | PRN
Start: 2022-03-05 — End: 2022-03-05
  Administered 2022-03-05: 2 mg via INTRAVENOUS

## 2022-03-05 MED ORDER — BUPIVACAINE HCL (PF) 0.5 % IJ SOLN
INTRAMUSCULAR | Status: AC
  Filled 2022-03-05: qty 30

## 2022-03-05 MED ORDER — BUPIVACAINE HCL (PF) 0.5 % IJ SOLN
INTRAMUSCULAR | Status: DC | PRN
  Administered 2022-03-05: 20 mL

## 2022-03-05 MED ORDER — IPRATROPIUM-ALBUTEROL 0.5-2.5 (3) MG/3ML IN SOLN
3.0000 mL | Freq: Once | RESPIRATORY_TRACT | Status: AC
Start: 2022-03-05 — End: 2022-03-05
  Administered 2022-03-05: 3 mL via RESPIRATORY_TRACT

## 2022-03-05 MED ORDER — PROPOFOL 10 MG/ML IV BOLUS
INTRAVENOUS | Status: DC | PRN
  Administered 2022-03-05: 200 mg via INTRAVENOUS

## 2022-03-05 MED ORDER — ALBUTEROL SULFATE HFA 108 (90 BASE) MCG/ACT IN AERS
INHALATION_SPRAY | RESPIRATORY_TRACT | Status: DC | PRN
  Administered 2022-03-05: 5 via RESPIRATORY_TRACT

## 2022-03-05 MED ORDER — METOPROLOL SUCCINATE ER 25 MG PO TB24
ORAL_TABLET | ORAL | Status: AC
  Filled 2022-03-05: qty 1

## 2022-03-05 MED ORDER — OXYCODONE HCL 5 MG PO TABS: 5.0000 mg | ORAL_TABLET | Freq: Once | ORAL | Status: DC | PRN

## 2022-03-05 MED ORDER — PROPOFOL 10 MG/ML IV BOLUS
INTRAVENOUS | Status: AC
  Filled 2022-03-05: qty 20

## 2022-03-05 MED ORDER — METOPROLOL SUCCINATE ER 25 MG PO TB24
25.0000 mg | ORAL_TABLET | Freq: Once | ORAL | Status: AC
  Administered 2022-03-05: 25 mg via ORAL

## 2022-03-05 MED ORDER — AMISULPRIDE (ANTIEMETIC) 5 MG/2ML IV SOLN: 10.0000 mg | Freq: Once | INTRAVENOUS | Status: DC | PRN

## 2022-03-05 MED ORDER — CHLORHEXIDINE GLUCONATE 0.12 % MT SOLN
15.0000 mL | Freq: Once | OROMUCOSAL | Status: AC
  Administered 2022-03-05: 15 mL via OROMUCOSAL
  Filled 2022-03-05: qty 15

## 2022-03-05 MED ORDER — LIDOCAINE 2% (20 MG/ML) 5 ML SYRINGE
INTRAMUSCULAR | Status: AC
  Filled 2022-03-05: qty 5

## 2022-03-05 SURGICAL SUPPLY — 13 items
ABLATOR SURESOUND NOVASURE (ABLATOR) ×2 IMPLANT
CATH ROBINSON RED A/P 16FR (CATHETERS) ×2 IMPLANT
GLOVE SURG ENC MOIS LTX SZ7 (GLOVE) ×2 IMPLANT
GLOVE SURG UNDER POLY LF SZ7 (GLOVE) ×4 IMPLANT
GOWN STRL REUS W/ TWL LRG LVL3 (GOWN DISPOSABLE) ×1 IMPLANT
GOWN STRL REUS W/ TWL XL LVL3 (GOWN DISPOSABLE) ×1 IMPLANT
GOWN STRL REUS W/TWL LRG LVL3 (GOWN DISPOSABLE) ×2
GOWN STRL REUS W/TWL XL LVL3 (GOWN DISPOSABLE) ×2
KIT PROCEDURE FLUENT (KITS) ×2 IMPLANT
KIT TURNOVER KIT B (KITS) ×2 IMPLANT
PACK VAGINAL MINOR WOMEN LF (CUSTOM PROCEDURE TRAY) ×2 IMPLANT
PAD OB MATERNITY 4.3X12.25 (PERSONAL CARE ITEMS) ×2 IMPLANT
TOWEL GREEN STERILE FF (TOWEL DISPOSABLE) ×4 IMPLANT

## 2022-03-05 NOTE — Op Note (Signed)
03/05/2022 ? ?12:38 PM ? ?PATIENT:  Tina Manning  31 y.o. female ? ?PRE-OPERATIVE DIAGNOSIS:  ABNORMAL UTERINE BLEEDING ? ?POST-OPERATIVE DIAGNOSIS:  ABNORMAL UTERINE BLEEDING ? ?PROCEDURE:  Procedure(s): ?DILATATION & CURETTAGE/HYSTEROSCOPY WITH NOVASURE ABLATION (N/A) ? ?SURGEON:  Surgeon(s) and Role: ?   Willodean Rosenthal, MD - Primary ? ?ANESTHESIA:   general and paracervical block ? ?EBL:  minimal  ? ?BLOOD ADMINISTERED:none ? ?DRAINS: none  ? ?LOCAL MEDICATIONS USED:  MARCAINE    ? ?SPECIMEN:  Source of Specimen:  endometrial currettings ? ?DISPOSITION OF SPECIMEN:  PATHOLOGY ? ?COUNTS:  YES ? ?TOURNIQUET:  * No tourniquets in log * ? ?DICTATION: .Note written in EPIC ? ?PLAN OF CARE: Discharge to home after PACU ? ?PATIENT DISPOSITION:  PACU - hemodynamically stable. ?  ?Delay start of Pharmacological VTE agent (>24hrs) due to surgical blood loss or risk of bleeding: not applicable ? ?Complications: none immediate ? 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  ? ?The risks, benefits, and alternatives of surgery were explained, understood, and accepted. The consents were signed and all questions were answered. She was taken to the operating room and general anesthesia was applied without complication. She was placed in the dorsal lithotomy position and her vagina and abdomen were prepped and draped in the usual sterile fashion. A bivalved speculum was placed in the patients' vagina and the anterior lip of the cervix was grasped with a single toothed tenaculum. A paracervical block was performed at 5 and 7 o'clock with 20cc of 0.5% Marcaine.   The endometrial cavity was sounded to 7.5cm and the endocervical length measured 2cm. A hysteroscope was inserted and the endometrium was noted to be slightly thickened. NO polyps or fibroids were noted. The ostia on both sides were noted.  The scope was removed and a sharp currete was used to scape the lining of the uterus until a gritty texture was  noted throughout.  Specimens were sent to pathology.  The NovaSure device was then inserted and seated using 5.5cm as the cavity length and 3.6cm as the cavity width.  The total activation time was 52sec at a power of 109.  The hysteroscope was reinserted and an even burn pattern was noted to the fundus.  The single toothed tenaculum was removed at the end of the case and no bleeding was noted from the cervix.   The patient was extubated and taken to the recovery room in stable condition.  Sponge, lap and instrument counts were correct.  There were no noted complications. ? ? ?Cicley Ganesh L. Harraway-Smith, M.D., FACOG ? ? ? ? ?  ?

## 2022-03-05 NOTE — Anesthesia Preprocedure Evaluation (Addendum)
Anesthesia Evaluation  ?Patient identified by MRN, date of birth, ID band ?Patient awake ? ? ? ?Reviewed: ?Allergy & Precautions, NPO status , Patient's Chart, lab work & pertinent test results ? ?History of Anesthesia Complications ?(+) PONV and history of anesthetic complications ? ?Airway ?Mallampati: II ? ?TM Distance: >3 FB ?Neck ROM: Full ? ? ? Dental ?no notable dental hx. ? ?  ?Pulmonary ?asthma , sleep apnea (mild, no CPAP indicatied per patient) ,  ?  ?Pulmonary exam normal ?breath sounds clear to auscultation ? ? ? ? ? ? Cardiovascular ?hypertension, Pt. on home beta blockers and Pt. on medications ?+ dysrhythmias  ?Rhythm:Regular Rate:Tachycardia ? ? ?  ?Neuro/Psych ?PSYCHIATRIC DISORDERS Anxiety Depression  Neuromuscular disease (sciatica)   ? GI/Hepatic ?Neg liver ROS, GERD  ,  ?Endo/Other  ?Morbid obesity (BMI 53)prediabetes ? Renal/GU ?negative Renal ROS  ?negative genitourinary ?  ?Musculoskeletal ?negative musculoskeletal ROS ?(+)  ? Abdominal ?(+) + obese,   ?Peds ?negative pediatric ROS ?(+)  Hematology ?negative hematology ROS ?(+)   ?Anesthesia Other Findings ? ? Reproductive/Obstetrics ?negative OB ROS ? ?  ? ? ? ? ? ? ? ? ? ? ? ? ? ?  ?  ? ? ? ? ? ? ? ?Anesthesia Physical ?Anesthesia Plan ? ?ASA: 4 ? ?Anesthesia Plan: General  ? ?Post-op Pain Management: Tylenol PO (pre-op)* and Minimal or no pain anticipated  ? ?Induction: Intravenous ? ?PONV Risk Score and Plan: Scopolamine patch - Pre-op, Treatment may vary due to age or medical condition, Ondansetron and Dexamethasone ? ?Airway Management Planned: Oral ETT ? ?Additional Equipment: None ? ?Intra-op Plan:  ? ?Post-operative Plan: Extubation in OR ? ?Informed Consent: I have reviewed the patients History and Physical, chart, labs and discussed the procedure including the risks, benefits and alternatives for the proposed anesthesia with the patient or authorized representative who has indicated his/her  understanding and acceptance.  ? ? ? ?Dental advisory given ? ?Plan Discussed with: CRNA, Surgeon and Anesthesiologist ? ?Anesthesia Plan Comments:   ? ? ? ? ? ?Anesthesia Quick Evaluation ? ?

## 2022-03-05 NOTE — Brief Op Note (Signed)
03/05/2022 ? ?12:38 PM ? ?PATIENT:  Sahithi Ordoyne  31 y.o. female ? ?PRE-OPERATIVE DIAGNOSIS:  ABNORMAL UTERINE BLEEDING ? ?POST-OPERATIVE DIAGNOSIS:  ABNORMAL UTERINE BLEEDING ? ?PROCEDURE:  Procedure(s): ?DILATATION & CURETTAGE/HYSTEROSCOPY WITH NOVASURE ABLATION (N/A) ? ?SURGEON:  Surgeon(s) and Role: ?   Willodean Rosenthal, MD - Primary ? ?ANESTHESIA:   general and paracervical block ? ?EBL:  minimal  ? ?BLOOD ADMINISTERED:none ? ?DRAINS: none  ? ?LOCAL MEDICATIONS USED:  MARCAINE    ? ?SPECIMEN:  Source of Specimen:  endometrial currettings ? ?DISPOSITION OF SPECIMEN:  PATHOLOGY ? ?COUNTS:  YES ? ?TOURNIQUET:  * No tourniquets in log * ? ?DICTATION: .Note written in EPIC ? ?PLAN OF CARE: Discharge to home after PACU ? ?PATIENT DISPOSITION:  PACU - hemodynamically stable. ?  ?Delay start of Pharmacological VTE agent (>24hrs) due to surgical blood loss or risk of bleeding: not applicable ? ?Complications: none immediate ? ?Ezmae Speers L. Harraway-Smith, M.D., FACOG ? ? ?

## 2022-03-05 NOTE — Anesthesia Procedure Notes (Signed)
Procedure Name: Intubation ?Date/Time: 03/05/2022 11:30 AM ?Performed by: Gaylene Brooks, CRNA ?Pre-anesthesia Checklist: Patient identified, Emergency Drugs available, Suction available and Patient being monitored ?Patient Re-evaluated:Patient Re-evaluated prior to induction ?Oxygen Delivery Method: Circle System Utilized ?Preoxygenation: Pre-oxygenation with 100% oxygen ?Induction Type: IV induction and Rapid sequence ?Ventilation: Two handed mask ventilation required ?Laryngoscope Size: Sabra Heck and 2 ?Grade View: Grade I ?Tube type: Oral ?Tube size: 7.0 mm ?Number of attempts: 1 ?Airway Equipment and Method: Stylet ?Placement Confirmation: ETT inserted through vocal cords under direct vision, positive ETCO2 and breath sounds checked- equal and bilateral ?Secured at: 22 cm ?Tube secured with: Tape ?Dental Injury: Teeth and Oropharynx as per pre-operative assessment  ? ? ? ? ?

## 2022-03-05 NOTE — Transfer of Care (Signed)
Immediate Anesthesia Transfer of Care Note ? ?Patient: Tina Manning ? ?Procedure(s) Performed: DILATATION & CURETTAGE/HYSTEROSCOPY WITH NOVASURE ABLATION (Uterus) ? ?Patient Location: PACU ? ?Anesthesia Type:General ? ?Level of Consciousness: patient cooperative and responds to stimulation ? ?Airway & Oxygen Therapy: Patient Spontanous Breathing and Patient connected to nasal cannula oxygen ? ?Post-op Assessment: Report given to RN, Post -op Vital signs reviewed and stable and Patient moving all extremities X 4 ? ?Post vital signs: Reviewed and stable ? ?Last Vitals:  ?Vitals Value Taken Time  ?BP 148/100 03/05/22 1215  ?Temp    ?Pulse 97 03/05/22 1219  ?Resp 13 03/05/22 1219  ?SpO2 98 % 03/05/22 1219  ?Vitals shown include unvalidated device data. ? ?Last Pain:  ?Vitals:  ? 03/05/22 0857  ?TempSrc:   ?PainSc: 0-No pain  ?   ? ?  ? ?Complications: No notable events documented. ?

## 2022-03-05 NOTE — Telephone Encounter (Signed)
Pt called stating she had a D & C this morning and she states she is in pain. She states the tylenol is not relieving the pain. I advised pt to continue to take her pain meds. A message will be sent to the provider.  ?Mays Paino l Zakar Brosch, CMA  ?

## 2022-03-05 NOTE — H&P (Signed)
Preoperative History and Physical  Tina MuskratRebecca Louise Manning is a 31 y.o. G0P0000 here for surgical management of abnormal uterine bleeding.   Proposed surgery: hysteroscopy with dilation and curettage and endometrial ablation using Novasure.   Past Medical History:  Diagnosis Date   ADHD (attention deficit hyperactivity disorder)    Anemia    Anxiety    Asthma    Complication of anesthesia    COVID 01/2022   out of work for a week   Endometriosis    GERD (gastroesophageal reflux disease)    Heart rate fast    takes Metoprolol   History of kidney stones    Pneumonia    PONV (postoperative nausea and vomiting)    Pre-diabetes    Tachycardia    Past Surgical History:  Procedure Laterality Date   CHOLECYSTECTOMY     OPEN REDUCTION INTERNAL FIXATION (ORIF) DISTAL RADIAL FRACTURE Right 05/15/2021   Procedure: OPEN REDUCTION INTERNAL FIXATION (ORIF)RIGHT  DISTAL RADIAL FRACTURE;  Surgeon: Cammy Copaean, Gregory Scott, MD;  Location: [facility] OR;  Service: Orthopedics;  Laterality: Right;   TYMPANOSTOMY TUBE PLACEMENT     as a baby   OB History     Gravida  0   Para  0   Term  0   Preterm  0   AB  0   Living  0      SAB  0   IAB  0   Ectopic  0   Multiple  0   Live Births  0          Patient denies any cervical dysplasia or STIs. Medications Prior to Admission  Medication Sig Dispense Refill Last Dose   albuterol (VENTOLIN HFA) 108 (90 Base) MCG/ACT inhaler INHALE 1 TO 2 PUFFS INTO THE LUNGS EVERY 6 HOURS AS NEEDED FOR WHEEZING OR SHORTNESS OF BREATH 18 g 5 03/04/2022   atorvastatin (LIPITOR) 40 MG tablet TAKE 1 TABLET(40 MG) BY MOUTH DAILY 90 tablet 0 03/04/2022   busPIRone (BUSPAR) 5 MG tablet Take 1 tablet (5 mg total) by mouth 2 (two) times daily. (Patient taking differently: Take 10 mg by mouth 2 (two) times daily.) 60 tablet 1 Past Week   ipratropium-albuterol (DUONEB) 0.5-2.5 (3) MG/3ML SOLN Take 3 mLs by nebulization in the morning, at noon, in the evening, and at  bedtime. (Patient taking differently: Take 3 mLs by nebulization every 4 (four) hours as needed (Asthma).) 360 mL 2 Past Month   metFORMIN (GLUCOPHAGE) 500 MG tablet Take 1 tablet (500 mg total) by mouth 2 (two) times daily with a meal. 60 tablet 5 03/04/2022   metoprolol succinate (TOPROL-XL) 25 MG 24 hr tablet TAKE 1 TABLET(25 MG) BY MOUTH DAILY 90 tablet 0 03/04/2022   mometasone-formoterol (DULERA) 200-5 MCG/ACT AERO Take 2 puffs first thing in am and then another 2 puffs about 12 hours later. 1 each 11 03/05/2022 at 0600   montelukast (SINGULAIR) 10 MG tablet Take 1 tablet (10 mg total) by mouth at bedtime. 30 tablet 11 03/04/2022   pantoprazole (PROTONIX) 40 MG tablet TAKE 1 TABLET(40 MG) BY MOUTH DAILY 30 tablet 5 03/04/2022   sertraline (ZOLOFT) 100 MG tablet Take 1 tablet (100 mg total) by mouth daily. 30 tablet 1 03/04/2022   traMADol (ULTRAM) 50 MG tablet Take 2 tablets (100 mg total) by mouth every 6 (six) hours as needed for severe pain. 60 tablet 0 Past Week   zolpidem (AMBIEN CR) 12.5 MG CR tablet Take 1 tablet (12.5 mg total) by mouth  at bedtime as needed for sleep. (Patient taking differently: Take 12.5 mg by mouth at bedtime.) 30 tablet 1 03/04/2022   hydrOXYzine (VISTARIL) 50 MG capsule Take 1 capsule (50 mg total) by mouth every 8 (eight) hours as needed. (Patient not taking: Reported on 03/01/2022) 30 capsule 0 Not Taking   ondansetron (ZOFRAN-ODT) 4 MG disintegrating tablet Take 1 tablet (4 mg total) by mouth every 8 (eight) hours as needed for nausea or vomiting. (Patient not taking: Reported on 03/01/2022) 20 tablet 0 Not Taking   predniSONE (DELTASONE) 10 MG tablet Take  4 each am x 2 days,   2 each am x 2 days,  1 each am x 2 days and stop (Patient not taking: Reported on 03/01/2022) 14 tablet 0 Not Taking    Allergies  Allergen Reactions   Bee Venom Anaphylaxis   Ketorolac Tromethamine Palpitations and Shortness Of Breath   Penicillins Nausea And Vomiting and Other (See Comments)     Intolerance    Social History:   reports that she has never smoked. She has never used smokeless tobacco. She reports that she does not drink alcohol and does not use drugs. Family History  Problem Relation Age of Onset   Heart attack Mother    Hypertension Mother    Breast cancer Mother    Polycystic ovary syndrome Mother    Heart disease Father    Hypertension Father    Liver cancer Father     Review of Systems: Noncontributory  PHYSICAL EXAM: Blood pressure (!) 137/93, pulse (!) 102, temperature 99 F (37.2 C), temperature source Oral, resp. rate 18, height 5\' 5"  (1.651 m), weight (!) 145.2 kg, last menstrual period 02/27/2022, SpO2 95 %. General appearance - alert, well appearing, and in no distress Chest - clear to auscultation, no wheezes, rales or rhonchi, symmetric air entry Heart - normal rate and regular rhythm Abdomen - soft, nontender, nondistended, no masses or organomegaly Pelvic - done in office Extremities - peripheral pulses normal, no pedal edema, no clubbing or cyanosis  Labs: Results for orders placed or performed during the hospital encounter of 03/05/22 (from the past 336 hour(s))  Pregnancy, urine POC   Collection Time: 03/05/22  9:17 AM  Result Value Ref Range   Preg Test, Ur NEGATIVE NEGATIVE  CBC per protocol   Collection Time: 03/05/22  9:27 AM  Result Value Ref Range   WBC 11.0 (H) 4.0 - 10.5 K/uL   RBC 4.90 3.87 - 5.11 MIL/uL   Hemoglobin 12.3 12.0 - 15.0 g/dL   HCT 03/07/22 97.3 - 53.2 %   MCV 80.4 80.0 - 100.0 fL   MCH 25.1 (L) 26.0 - 34.0 pg   MCHC 31.2 30.0 - 36.0 g/dL   RDW 99.2 (H) 42.6 - 83.4 %   Platelets 431 (H) 150 - 400 K/uL   nRBC 0.0 0.0 - 0.2 %  Basic metabolic panel per protocol   Collection Time: 03/05/22  9:27 AM  Result Value Ref Range   Sodium 137 135 - 145 mmol/L   Potassium 4.1 3.5 - 5.1 mmol/L   Chloride 105 98 - 111 mmol/L   CO2 23 22 - 32 mmol/L   Glucose, Bld 90 70 - 99 mg/dL   BUN 8 6 - 20 mg/dL   Creatinine,  Ser 03/07/22 0.44 - 1.00 mg/dL   Calcium 9.1 8.9 - 2.22 mg/dL   GFR, Estimated 97.9 >89 mL/min   Anion gap 9 5 - 15    Imaging Studies: Labs  and Imaging 12/19/2021 CLINICAL DATA:  Abnormal uterine bleeding.   EXAM: TRANSABDOMINAL AND TRANSVAGINAL ULTRASOUND OF PELVIS   TECHNIQUE: Both transabdominal and transvaginal ultrasound examinations of the pelvis were performed. Transabdominal technique was performed for global imaging of the pelvis including uterus, ovaries, adnexal regions, and pelvic cul-de-sac. It was necessary to proceed with endovaginal exam following the transabdominal exam to visualize the endometrium and ovaries.   COMPARISON:  None   FINDINGS: Uterus   Measurements: 7.3 x 4.8 x 4.1 cm = volume: 75 mL. 2 cm fibroid is noted in anterior portion of uterus.   Endometrium   Thickness: 6 mm which is within normal limits. No focal abnormality visualized.   Right ovary   Measurements: 2.4 x 2.5 x 2.2 cm = volume: 7 mL. Normal appearance/no adnexal mass.   Left ovary   Measurements: 3.0 x 2.8 x 2.5 cm = volume: 11 mL. Normal appearance/no adnexal mass.   Other findings   No abnormal free fluid.   IMPRESSION: 2 cm uterine fibroid is noted. No other definite abnormality is noted in the pelvis.   If bleeding remains unresponsive to hormonal or medical therapy, sonohysterogram should be considered for focal lesion work-up. (Ref: Radiological Reasoning: Algorithmic Workup of Abnormal Vaginal Bleeding with Endovaginal Sonography and Sonohysterography. 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   The risks of surgery were discussed in detail with the patient including but not limited to: bleeding which may require transfusion or reoperation; infection which may require antibiotics; injury to surrounding organs which may involve bowel, bladder, ureters ; need for additional procedures including laparoscopy or laparotomy; thromboembolic phenomenon, surgical site problems and other postoperative/anesthesia complications. Likelihood of success in alleviating the patient's condition was discussed. Routine postoperative instructions will be reviewed with the patient and her family in detail after surgery.  The patient concurred with the proposed plan, giving informed written consent for the surgery.  Patient has been NPO since last night she will remain NPO for procedure.  Anesthesia and OR aware.  Preoperative prophylactic antibiotics and SCDs ordered on call to the OR.  To OR when ready.  Marlyce Mcdougald L. Erin Fulling, M.D., [facility] 03/05/2022 10:32 AM

## 2022-03-05 NOTE — Telephone Encounter (Signed)
Dr Wynona Neat, please advise on ambien 12.5 refill, thanks! ?

## 2022-03-05 NOTE — Anesthesia Postprocedure Evaluation (Signed)
Anesthesia Post Note ? ?Patient: Tina Manning ? ?Procedure(s) Performed: DILATATION & CURETTAGE/HYSTEROSCOPY WITH NOVASURE ABLATION (Uterus) ? ?  ? ?Patient location during evaluation: PACU ?Anesthesia Type: General ?Level of consciousness: awake ?Pain management: pain level controlled ?Vital Signs Assessment: post-procedure vital signs reviewed and stable ?Respiratory status: spontaneous breathing and respiratory function stable ?Cardiovascular status: stable ?Postop Assessment: no apparent nausea or vomiting ?Anesthetic complications: no ? ? ?No notable events documented. ? ?Last Vitals:  ?Vitals:  ? 03/05/22 1330 03/05/22 1345  ?BP: 121/75 131/86  ?Pulse: 82 87  ?Resp: 15 20  ?Temp:  37.2 ?C  ?SpO2: 95% 99%  ?  ?Last Pain:  ?Vitals:  ? 03/05/22 0857  ?TempSrc:   ?PainSc: 0-No pain  ? ? ?  ?  ?  ?  ?  ?  ? ?Mellody Dance ? ? ? ? ?

## 2022-03-06 ENCOUNTER — Encounter (HOSPITAL_COMMUNITY): Payer: Self-pay | Admitting: Obstetrics & Gynecology

## 2022-03-06 ENCOUNTER — Encounter: Payer: Self-pay | Admitting: Obstetrics & Gynecology

## 2022-03-06 ENCOUNTER — Ambulatory Visit: Payer: 59 | Admitting: Dietician

## 2022-03-06 LAB — SURGICAL PATHOLOGY

## 2022-03-06 MED ORDER — ZOLPIDEM TARTRATE ER 12.5 MG PO TBCR: 12.5000 mg | EXTENDED_RELEASE_TABLET | Freq: Every day | ORAL | 3 refills | Status: DC

## 2022-03-07 ENCOUNTER — Encounter: Payer: Self-pay | Admitting: Obstetrics & Gynecology

## 2022-03-07 ENCOUNTER — Other Ambulatory Visit: Payer: Self-pay

## 2022-03-07 ENCOUNTER — Ambulatory Visit (INDEPENDENT_AMBULATORY_CARE_PROVIDER_SITE_OTHER): Payer: 59 | Admitting: Obstetrics & Gynecology

## 2022-03-07 VITALS — BP 127/89 | HR 119 | Temp 98.5°F | Wt 329.0 lb

## 2022-03-07 DIAGNOSIS — N939 Abnormal uterine and vaginal bleeding, unspecified: Secondary | ICD-10-CM | POA: Diagnosis not present

## 2022-03-07 DIAGNOSIS — R509 Fever, unspecified: Secondary | ICD-10-CM | POA: Diagnosis not present

## 2022-03-07 DIAGNOSIS — G8918 Other acute postprocedural pain: Secondary | ICD-10-CM | POA: Diagnosis not present

## 2022-03-07 LAB — POCT URINALYSIS DIPSTICK
Bilirubin, UA: NEGATIVE
Glucose, UA: NEGATIVE
Ketones, UA: NEGATIVE
Nitrite, UA: NEGATIVE
Protein, UA: NEGATIVE
Spec Grav, UA: 1.02 (ref 1.010–1.025)
Urobilinogen, UA: 0.2 E.U./dL
pH, UA: 5 (ref 5.0–8.0)

## 2022-03-07 NOTE — Progress Notes (Signed)
History:  ?31 y.o. G0P0000 here today for elevated temp following hysteroscopy 2 days prev. Pt is s/p an uncomplicated hysteroscopy with endometrial ablation on 03/05/2022, 2 days prev.    ? ?The following portions of the patient's history were reviewed and updated as appropriate: allergies, current medications, past family history, past medical history, past social history, past surgical history and problem list. ? ?Review of Systems:  ?Pertinent items are noted in HPI. ?   ?Objective:  ?Physical Exam ?Last menstrual period 02/27/2022. ? ?CONSTITUTIONAL: Well-developed, well-nourished female in no acute distress.  ?HENT:  Normocephalic, atraumatic ?EYES: Conjunctivae and EOM are normal. No scleral icterus.  ?NECK: Normal range of motion ?SKIN: Skin is warm and dry. No rash noted. Not diaphoretic.No pallor. ?NEUROLGIC: Alert and oriented to person, place, and time. Normal coordination.  ?Abd: soft, NT, ND ?Pelvic; not done ? ?Assessment & Plan:  ?POD #2 s/p hysteroscopy with D&C and endometrial ablation. Normal post op  cramping ?F/u in 2 weeks or sooner prn  ? ?Oyinkansola Truax L. Harraway-Smith, M.D., FACOG ? ? ?

## 2022-03-08 ENCOUNTER — Encounter: Payer: Self-pay | Admitting: Obstetrics & Gynecology

## 2022-03-08 LAB — CBC
Hematocrit: 39.3 % (ref 34.0–46.6)
Hemoglobin: 12.2 g/dL (ref 11.1–15.9)
MCH: 24.7 pg — ABNORMAL LOW (ref 26.6–33.0)
MCHC: 31 g/dL — ABNORMAL LOW (ref 31.5–35.7)
MCV: 80 fL (ref 79–97)
Platelets: 489 10*3/uL — ABNORMAL HIGH (ref 150–450)
RBC: 4.93 x10E6/uL (ref 3.77–5.28)
RDW: 15.6 % — ABNORMAL HIGH (ref 11.7–15.4)
WBC: 15.2 10*3/uL — ABNORMAL HIGH (ref 3.4–10.8)

## 2022-03-09 LAB — URINE CULTURE

## 2022-03-19 ENCOUNTER — Ambulatory Visit: Payer: Self-pay | Admitting: *Deleted

## 2022-03-19 NOTE — Telephone Encounter (Signed)
?  Chief Complaint: ? gout ?Symptoms: toe pain ?Frequency: most of time especially if walks on it ?Pertinent Negatives: Patient denies fever ?Disposition: [] ED /[] Urgent Care (no appt availability in office) / [x] Appointment(In office/virtual)/ []  [place] [place] [facility]/ [] Home Care/ [] Refused Recommended Disposition /[] [place] [facility]/ []  Follow-up with PCP ?Additional Notes: Pt has appt for tomorrow, if believes she needs earlier attention was advised of MyChart visit. Pt agreed. ? ? ?Reason for Disposition ? [1] Redness of the skin AND [2] no fever ? ?Answer Assessment - Initial Assessment Questions ?1. ONSET: "When did the pain start?"  ?    2 ?2. LOCATION: "Where is the pain located?"  ?    1st toe throbbing ?3. PAIN: "How bad is the pain?"    (Scale 1-10; or mild, moderate, severe) ? - MILD (1-3): doesn't interfere with normal activities.  ? - MODERATE (4-7): interferes with normal activities (e.g., work or school) or awakens from sleep, limping.  ? - SEVERE (8-10): excruciating pain, unable to do any normal activities, unable to walk.  ?    7 ?4. WORK OR EXERCISE: "Has there been any recent work or exercise that involved this part of the body?"  ?    No, thinks it is gout ?5. CAUSE: "What do you think is causing the foot pain?" ?    Thinks it is first gout flare up ?6. OTHER SYMPTOMS: "Do you have any other symptoms?" (e.g., leg pain, rash, fever, numbness) ?    no ?7. PREGNANCY: "Is there any chance you are pregnant?" "When was your last menstrual period?" ?    na ? ?Protocols used: Foot Pain-A-AH ? ?

## 2022-03-19 NOTE — Progress Notes (Signed)
? ? ?Patient ID: Tina Manning, female    DOB: 1991-05-11  MRN: [id number] ? ?CC: Toe Pain ? ?Subjective: ?Tina Manning is a 31 y.o. female who presents for toe pain.  ? ?Her concerns today include:  ? ?TOE PAIN: ?Reports right big toe pain for 3 days. States woke up 3 days ago with sharp pain in right toe and has not went away. Worse with movement. Denies any recent trauma/injury to right big toe. Taking Ibuprofen over-the-counter helps some. Denies chest pain, shortness of breath, right lower leg swelling/redness/tenderness/pain and additional red flag symptoms.  ? ?Patient Active Problem List  ? Diagnosis Date Noted  ? Abnormal uterine bleeding (AUB) 12/15/2021  ? Hirsutism 12/15/2021  ? Snoring 12/05/2021  ? Insomnia 11/13/2021  ? Sciatic nerve pain, right 10/31/2021  ? Acute right-sided low back pain with right-sided sciatica 10/31/2021  ? 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 Hyperlipidemia 08/15/2021  ? Prediabetes 08/15/2021  ? Morbid obesity due to excess calories (HCC) 08/15/2021  ? Closed fracture of lower end of right radius with routine healing   ? Anxiety and depression 08/09/2011  ? Endometriosis 03/27/2009  ? Tachycardia 11/27/1998  ? Asthma 04/24/1994  ?  ? ?Current Outpatient Medications on File Prior to Visit  ?Medication Sig Dispense Refill  ? albuterol (VENTOLIN HFA) 108 (90 Base) MCG/ACT inhaler INHALE 1 TO 2 PUFFS INTO THE LUNGS EVERY 6 HOURS AS NEEDED FOR WHEEZING OR SHORTNESS OF BREATH 18 g 5  ? atorvastatin (LIPITOR) 40 MG tablet TAKE 1 TABLET(40 MG) BY MOUTH DAILY 90 tablet 0  ? busPIRone (BUSPAR) 5 MG tablet Take 1 tablet (5 mg total) by mouth 2 (two) times daily. (Patient taking differently: Take 10 mg by mouth 2 (two) times daily.) 60 tablet 1  ? hydrOXYzine (VISTARIL) 50 MG capsule Take 1 capsule (50 mg total) by mouth every 8 (eight) hours as needed. 30 capsule 0  ? ipratropium-albuterol (DUONEB) 0.5-2.5 (3) MG/3ML SOLN Take 3 mLs  by nebulization in the morning, at noon, in the evening, and at bedtime. (Patient taking differently: Take 3 mLs by nebulization every 4 (four) hours as needed (Asthma).) 360 mL 2  ? metFORMIN (GLUCOPHAGE) 500 MG tablet Take 1 tablet (500 mg total) by mouth 2 (two) times daily with a meal. 60 tablet 5  ? metoprolol succinate (TOPROL-XL) 25 MG 24 hr tablet TAKE 1 TABLET(25 MG) BY MOUTH DAILY 90 tablet 0  ? mometasone-formoterol (DULERA) 200-5 MCG/ACT AERO Take 2 puffs first thing in am and then another 2 puffs about 12 hours later. 1 each 11  ? montelukast (SINGULAIR) 10 MG tablet Take 1 tablet (10 mg total) by mouth at bedtime. 30 tablet 11  ? ondansetron (ZOFRAN-ODT) 4 MG disintegrating tablet Take 1 tablet (4 mg total) by mouth every 8 (eight) hours as needed for nausea or vomiting. (Patient not taking: Reported on 03/01/2022) 20 tablet 0  ? pantoprazole (PROTONIX) 40 MG tablet TAKE 1 TABLET(40 MG) BY MOUTH DAILY 30 tablet 5  ? predniSONE (DELTASONE) 10 MG tablet Take  4 each am x 2 days,   2 each am x 2 days,  1 each am x 2 days and stop (Patient not taking: Reported on 03/01/2022) 14 tablet 0  ? sertraline (ZOLOFT) 100 MG tablet Take 1 tablet (100 mg total) by mouth daily. 30 tablet 1  ? traMADol (ULTRAM) 50 MG tablet Take 2 tablets (100 mg total) by mouth every 6 (six)  hours as needed for severe pain. 60 tablet 0  ? zolpidem (AMBIEN CR) 12.5 MG CR tablet Take 1 tablet (12.5 mg total) by mouth at bedtime. 30 tablet 3  ? ?No current facility-administered medications on file prior to visit.  ? ? ?Allergies  ?Allergen Reactions  ? Bee Venom Anaphylaxis  ? Ketorolac Tromethamine Palpitations and Shortness Of Breath  ? Penicillins Nausea And Vomiting and Other (See Comments)  ?  Intolerance ?  ? ? ?Social History  ? ?Socioeconomic History  ? Marital status: Married  ?  Spouse name: Not on file  ? Number of children: 0  ? Years of education: Not on file  ? Highest education level: Not on file  ?Occupational History  ?  Occupation: Chief Financial Officer  ?Tobacco Use  ? Smoking status: Never  ? Smokeless tobacco: Never  ?Vaping Use  ? Vaping Use: Never used  ?Substance and Sexual Activity  ? Alcohol use: No  ? Drug use: No  ? Sexual activity: Not Currently  ?  Birth control/protection: None  ?Other Topics Concern  ? Not on file  ?Social History Narrative  ? Not on file  ? ?Social Determinants of Health  ? ?Financial Resource Strain: Not on file  ?Food Insecurity: Not on file  ?Transportation Needs: Not on file  ?Physical Activity: Not on file  ?Stress: Not on file  ?Social Connections: Not on file  ?Intimate Partner Violence: Not on file  ? ? ?Family History  ?Problem Relation Age of Onset  ? Heart attack Mother   ? Hypertension Mother   ? Breast cancer Mother   ? Polycystic ovary syndrome Mother   ? Heart disease Father   ? Hypertension Father   ? Liver cancer Father   ? ? ?Past Surgical History:  ?Procedure Laterality Date  ? CHOLECYSTECTOMY    ? DILITATION & CURRETTAGE/HYSTROSCOPY WITH NOVASURE ABLATION N/A 03/05/2022  ? Procedure: DILATATION & CURETTAGE/HYSTEROSCOPY WITH NOVASURE ABLATION;  Surgeon: Lavonia Drafts, MD;  Location: [facility];  Service: Gynecology;  Laterality: N/A;  ? OPEN REDUCTION INTERNAL FIXATION (ORIF) DISTAL RADIAL FRACTURE Right 05/15/2021  ? Procedure: OPEN REDUCTION INTERNAL FIXATION (ORIF)RIGHT  DISTAL RADIAL FRACTURE;  Surgeon: Meredith Pel, MD;  Location: [facility];  Service: Orthopedics;  Laterality: Right;  ? TYMPANOSTOMY TUBE PLACEMENT    ? as a baby  ? ? ?ROS: ?Review of Systems ?Negative except as stated above ? ?PHYSICAL EXAM: ?BP 111/82 (BP Location: Left Arm, Patient Position: Sitting, Cuff Size: Large)   Pulse (!) 115   Temp 98.3 ?F (36.8 ?C)   Resp 19   Ht 5' 4.02" (1.626 m)   Wt (!) 320 lb (145.2 kg)   LMP 02/27/2022 (Exact Date)   SpO2 97%   BMI 54.90 kg/m?  ? ?Physical Exam ?HENT:  ?   Head: Normocephalic and atraumatic.  ?Eyes:  ?   Extraocular Movements: Extraocular  movements intact.  ?   Conjunctiva/sclera: Conjunctivae normal.  ?   Pupils: Pupils are equal, round, and reactive to light.  ?Cardiovascular:  ?   Rate and Rhythm: Tachycardia present.  ?   Pulses: Normal pulses.  ?   Heart sounds: Normal heart sounds.  ?Pulmonary:  ?   Effort: Pulmonary effort is normal.  ?   Breath sounds: Normal breath sounds.  ?Musculoskeletal:  ?   Cervical back: Normal range of motion and neck supple.  ?   Comments: Right great toe with tenderness to palpation. No evidence of erythema.   ?  Neurological:  ?   General: No focal deficit present.  ?   Mental Status: She is alert and oriented to person, place, and time.  ?Psychiatric:     ?   Mood and Affect: Mood normal.     ?   Behavior: Behavior normal.  ? ? ?ASSESSMENT AND PLAN: ?1. Great toe pain, right: ?- Patient denies any recent trauma/injury.  ?- Ibuprofen as prescribed. Counseled on medication adherence and adverse effects. Patient reports does well with Ibuprofen.  ?- Uric acid to screen for gout.  ?- Follow-up with primary provider as scheduled.  ?- Uric Acid ?- ibuprofen (ADVIL) 600 MG tablet; Take 1 tablet (600 mg total) by mouth every 8 (eight) hours as needed.  Dispense: 30 tablet; Refill: 0 ? ? ?Patient was given the opportunity to ask questions.  Patient verbalized understanding of the plan and was able to repeat key elements of the plan. Patient was given clear instructions to go to Emergency Department or return to medical center if symptoms don't improve, worsen, or new problems develop.The patient verbalized understanding. ? ? ?Orders Placed This Encounter  ?Procedures  ? Uric Acid  ? ?Requested Prescriptions  ? ?Signed Prescriptions Disp Refills  ? ibuprofen (ADVIL) 600 MG tablet 30 tablet 0  ?  Sig: Take 1 tablet (600 mg total) by mouth every 8 (eight) hours as needed.  ? ? ?Follow-up with primary provider as scheduled.  ? ?Camillia Herter, NP  ?

## 2022-03-20 ENCOUNTER — Encounter: Payer: Self-pay | Admitting: Family

## 2022-03-20 ENCOUNTER — Other Ambulatory Visit: Payer: Self-pay

## 2022-03-20 ENCOUNTER — Ambulatory Visit (INDEPENDENT_AMBULATORY_CARE_PROVIDER_SITE_OTHER): Payer: 59 | Admitting: Family

## 2022-03-20 VITALS — BP 111/82 | HR 115 | Temp 98.3°F | Resp 19 | Ht 64.02 in | Wt 320.0 lb

## 2022-03-20 DIAGNOSIS — M79674 Pain in right toe(s): Secondary | ICD-10-CM

## 2022-03-20 MED ORDER — IBUPROFEN 600 MG PO TABS: 600.0000 mg | ORAL_TABLET | Freq: Three times a day (TID) | ORAL | 0 refills | Status: DC | PRN

## 2022-03-20 NOTE — Progress Notes (Signed)
Pt presents for right big toe pain, pt states woke up on Saturday with sharp pain in toe and has not went away. Denies any trauma to toe  ?

## 2022-03-21 ENCOUNTER — Encounter: Payer: Self-pay | Admitting: Family

## 2022-03-21 LAB — URIC ACID: Uric Acid: 1.9 mg/dL — ABNORMAL LOW (ref 2.6–6.2)

## 2022-03-21 NOTE — Progress Notes (Signed)
No gout. Continue with plan discussed in office. Follow-up with primary provider as needed.

## 2022-03-22 ENCOUNTER — Telehealth: Payer: Self-pay | Admitting: Family

## 2022-03-22 NOTE — Telephone Encounter (Signed)
Copied from CRM [id number]. Topic: General - Call Back - No Documentation ?>> Mar 22, 2022 12:41 PM Randol Kern wrote: ?Reason for CRM: Pt returned missed call, please advise  ?Best contact: [phone] ?

## 2022-03-28 ENCOUNTER — Ambulatory Visit
Admission: EM | Admit: 2022-03-28 | Discharge: 2022-03-28 | Disposition: A | Discharge status: Home / Self Care | Payer: 59 | Attending: Family Medicine | Admitting: Family Medicine

## 2022-03-28 ENCOUNTER — Other Ambulatory Visit: Payer: Self-pay | Admitting: Family Medicine

## 2022-03-28 ENCOUNTER — Encounter: Payer: Self-pay | Admitting: Obstetrics & Gynecology

## 2022-03-28 ENCOUNTER — Telehealth: Payer: 59 | Admitting: Nurse Practitioner

## 2022-03-28 DIAGNOSIS — J4541 Moderate persistent asthma with (acute) exacerbation: Secondary | ICD-10-CM | POA: Insufficient documentation

## 2022-03-28 DIAGNOSIS — J029 Acute pharyngitis, unspecified: Secondary | ICD-10-CM | POA: Diagnosis not present

## 2022-03-28 DIAGNOSIS — J069 Acute upper respiratory infection, unspecified: Secondary | ICD-10-CM

## 2022-03-28 LAB — POCT RAPID STREP A (OFFICE): Rapid Strep A Screen: NEGATIVE

## 2022-03-28 MED ORDER — TRIAMCINOLONE ACETONIDE 40 MG/ML IJ SUSP: 40.0000 mg | Freq: Once | INTRAMUSCULAR | Status: DC

## 2022-03-28 MED ORDER — PREDNISONE 20 MG PO TABS
40.0000 mg | ORAL_TABLET | Freq: Every day | ORAL | 0 refills | Status: DC
Start: 2022-03-28 — End: 2022-03-31

## 2022-03-28 MED ORDER — FLUTICASONE PROPIONATE 50 MCG/ACT NA SUSP: 2.0000 | Freq: Every day | NASAL | 6 refills | Status: DC

## 2022-03-28 MED ORDER — BENZONATATE 100 MG PO CAPS: 100.0000 mg | ORAL_CAPSULE | Freq: Three times a day (TID) | ORAL | 0 refills | Status: DC | PRN

## 2022-03-28 MED ORDER — ALBUTEROL SULFATE (2.5 MG/3ML) 0.083% IN NEBU
2.5000 mg | INHALATION_SOLUTION | Freq: Once | RESPIRATORY_TRACT | Status: AC
  Administered 2022-03-28: 2.5 mg via RESPIRATORY_TRACT

## 2022-03-28 NOTE — Discharge Instructions (Addendum)
?  You have been swabbed for COVID, and the test will result in the next 24 hours. Our staff will call you if positive. If the test is positive, you should quarantine for 5 days.  If you are positive instead for the flu, you can be prescribed medicine that can help improve ? ?Staff will call you if the throat culture is positive for strep so that she can be treated for that instead ? ?You have been given a shot of triamcinolone 40 mg here ? ?Take prednisone 20 mg--2 tabs daily for 5 days ? ?Continue using your albuterol inhaler or your nebulizer treatments every 4 hours as needed ?

## 2022-03-28 NOTE — Progress Notes (Signed)

## 2022-03-28 NOTE — ED Provider Notes (Signed)
?[facility] ? ? ? ?CSN: [id number] ?Arrival date & time: 03/28/22  1439 ? ? ?  ? ?History   ?Chief Complaint ?Chief Complaint  ?Patient presents with  ? Sore Throat  ? ? ?HPI ?Tina Manning is a 31 y.o. female.  ? ? ?Sore Throat ? ?Here for very sore, raw throat since yesterday.  Also she feels tight and raw in her central chest.  She has been wheezing more than usual and last used her inhaler about 2 hours ago.  She is uncertain if she has had any fever.  No vomiting or diarrhea.  She has not coughed much, but she says that some of that is in part due to the fact that it hurts so much cough that she is trying not to. Not much nasal congestion ? ?Past Medical History:  ?Diagnosis Date  ? ADHD (attention deficit hyperactivity disorder)   ? Anemia   ? Anxiety   ? Asthma   ? Complication of anesthesia   ? COVID 01/2022  ? out of work for a week  ? Endometriosis   ? GERD (gastroesophageal reflux disease)   ? Heart rate fast   ? takes Metoprolol  ? History of kidney stones   ? Pneumonia   ? PONV (postoperative nausea and vomiting)   ? Pre-diabetes   ? Tachycardia   ? ? ?Patient Active Problem List  ? Diagnosis Date Noted  ? Abnormal uterine bleeding (AUB) 12/15/2021  ? Hirsutism 12/15/2021  ? Snoring 12/05/2021  ? Insomnia 11/13/2021  ? Sciatic nerve pain, right 10/31/2021  ? Acute right-sided low back pain with right-sided sciatica 10/31/2021  ? 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 Hyperlipidemia 08/15/2021  ? Prediabetes 08/15/2021  ? Morbid obesity due to excess calories (HCC) 08/15/2021  ? Closed fracture of lower end of right radius with routine healing   ? Anxiety and depression 08/09/2011  ? Endometriosis 03/27/2009  ? Tachycardia 11/27/1998  ? Asthma 04/24/1994  ? ? ?Past Surgical History:  ?Procedure Laterality Date  ? CHOLECYSTECTOMY    ? DILITATION & CURRETTAGE/HYSTROSCOPY WITH NOVASURE ABLATION N/A 03/05/2022  ? Procedure: DILATATION &  CURETTAGE/HYSTEROSCOPY WITH NOVASURE ABLATION;  Surgeon: Willodean Rosenthal, MD;  Location: [facility];  Service: Gynecology;  Laterality: N/A;  ? OPEN REDUCTION INTERNAL FIXATION (ORIF) DISTAL RADIAL FRACTURE Right 05/15/2021  ? Procedure: OPEN REDUCTION INTERNAL FIXATION (ORIF)RIGHT  DISTAL RADIAL FRACTURE;  Surgeon: Cammy Copa, MD;  Location: [facility];  Service: Orthopedics;  Laterality: Right;  ? TYMPANOSTOMY TUBE PLACEMENT    ? as a baby  ? ? ?OB History   ? ? Gravida  ?0  ? Para  ?0  ? Term  ?0  ? Preterm  ?0  ? AB  ?0  ? Living  ?0  ?  ? ? SAB  ?0  ? IAB  ?0  ? Ectopic  ?0  ? Multiple  ?0  ? Live Births  ?0  ?   ?  ?  ? ? ? ?Home Medications   ? ?Prior to Admission medications   ?Medication Sig Start Date End Date Taking? Authorizing Provider  ?predniSONE (DELTASONE) 20 MG tablet Take 2 tablets (40 mg total) by mouth daily with breakfast for 5 days. 03/28/22 04/02/22 Yes Josanne Boerema, Janace Aris, MD  ?albuterol (VENTOLIN HFA) 108 (90 Base) MCG/ACT inhaler INHALE 1 TO 2 PUFFS INTO THE LUNGS EVERY 6 HOURS AS NEEDED FOR WHEEZING OR SHORTNESS OF BREATH  02/21/22   Nyoka Cowden, MD  ?atorvastatin (LIPITOR) 40 MG tablet TAKE 1 TABLET(40 MG) BY MOUTH DAILY 10/27/21   Rema Fendt, NP  ?busPIRone (BUSPAR) 5 MG tablet Take 1 tablet (5 mg total) by mouth 2 (two) times daily. ?Patient taking differently: Take 10 mg by mouth 2 (two) times daily. 01/19/22 01/19/23  Thresa Ross, MD  ?fluticasone (FLONASE) 50 MCG/ACT nasal spray Place 2 sprays into both nostrils daily. 03/28/22   Bennie Pierini, FNP  ?hydrOXYzine (VISTARIL) 50 MG capsule Take 1 capsule (50 mg total) by mouth every 8 (eight) hours as needed. 09/29/21   Rema Fendt, NP  ?ibuprofen (ADVIL) 600 MG tablet Take 1 tablet (600 mg total) by mouth every 8 (eight) hours as needed. 03/20/22   Rema Fendt, NP  ?ipratropium-albuterol (DUONEB) 0.5-2.5 (3) MG/3ML SOLN Take 3 mLs by nebulization in the morning, at noon, in the evening, and at bedtime. ?Patient  taking differently: Take 3 mLs by nebulization every 4 (four) hours as needed (Asthma). 01/04/22   Rema Fendt, NP  ?metFORMIN (GLUCOPHAGE) 500 MG tablet Take 1 tablet (500 mg total) by mouth 2 (two) times daily with a meal. 12/21/21   Levie Heritage, DO  ?metoprolol succinate (TOPROL-XL) 25 MG 24 hr tablet TAKE 1 TABLET(25 MG) BY MOUTH DAILY 01/12/22   Rema Fendt, NP  ?mometasone-formoterol (DULERA) 200-5 MCG/ACT AERO Take 2 puffs first thing in am and then another 2 puffs about 12 hours later. 08/15/21   Nyoka Cowden, MD  ?montelukast (SINGULAIR) 10 MG tablet Take 1 tablet (10 mg total) by mouth at bedtime. 09/12/21   Glenford Bayley, NP  ?pantoprazole (PROTONIX) 40 MG tablet TAKE 1 TABLET(40 MG) BY MOUTH DAILY 01/11/22   Glenford Bayley, NP  ?sertraline (ZOLOFT) 100 MG tablet Take 1 tablet (100 mg total) by mouth daily. 01/19/22 01/19/23  Thresa Ross, MD  ?traMADol (ULTRAM) 50 MG tablet Take 2 tablets (100 mg total) by mouth every 6 (six) hours as needed for severe pain. 12/29/21   Levie Heritage, DO  ?zolpidem (AMBIEN CR) 12.5 MG CR tablet Take 1 tablet (12.5 mg total) by mouth at bedtime. 03/06/22   Tomma Lightning, MD  ? ? ?Family History ?Family History  ?Problem Relation Age of Onset  ? Heart attack Mother   ? Hypertension Mother   ? Breast cancer Mother   ? Polycystic ovary syndrome Mother   ? Heart disease Father   ? Hypertension Father   ? Liver cancer Father   ? ? ?Social History ?Social History  ? ?Tobacco Use  ? Smoking status: Never  ? Smokeless tobacco: Never  ?Vaping Use  ? Vaping Use: Never used  ?Substance Use Topics  ? Alcohol use: No  ? Drug use: No  ? ? ? ?Allergies   ?Bee venom, Ketorolac tromethamine, and Penicillins ? ? ?Review of Systems ?Review of Systems ? ? ?Physical Exam ?Triage Vital Signs ?ED Triage Vitals [03/28/22 1555]  ?Enc Vitals Group  ?   BP (!) 150/100  ?   Pulse Rate (!) 116  ?   Resp 18  ?   Temp 98.5 ?F (36.9 ?C)  ?   Temp Source Oral  ?   SpO2 95 %  ?    Weight   ?   Height   ?   Head Circumference   ?   Peak Flow   ?   Pain Score 0  ?   Pain  Loc   ?   Pain Edu?   ?   Excl. in GC?   ? ?No data found. ? ?Updated Vital Signs ?BP (!) 150/100 (BP Location: Left Arm)   Pulse (!) 116   Temp 98.5 ?F (36.9 ?C) (Oral)   Resp 18   LMP 02/27/2022 (Exact Date)   SpO2 95%  ? ?Visual Acuity ?Right Eye Distance:   ?Left Eye Distance:   ?Bilateral Distance:   ? ?Right Eye Near:   ?Left Eye Near:    ?Bilateral Near:    ? ?Physical Exam ?Vitals reviewed.  ?Constitutional:   ?   General: She is not in acute distress. ?   Appearance: She is not toxic-appearing.  ?HENT:  ?   Right Ear: Tympanic membrane and ear canal normal.  ?   Left Ear: Tympanic membrane and ear canal normal.  ?   Nose: Nose normal.  ?   Mouth/Throat:  ?   Mouth: Mucous membranes are moist.  ?   Comments: Oropharynx is very erythematous with some clear mucus in the oropharynx ?Eyes:  ?   Extraocular Movements: Extraocular movements intact.  ?   Conjunctiva/sclera: Conjunctivae normal.  ?   Pupils: Pupils are equal, round, and reactive to light.  ?Cardiovascular:  ?   Rate and Rhythm: Normal rate and regular rhythm.  ?   Heart sounds: No murmur heard. ?Pulmonary:  ?   Effort: Pulmonary effort is normal. No respiratory distress.  ?   Breath sounds: Wheezing (There are bilateral wheezes though overall air movement is good.) present. No rhonchi or rales.  ?Chest:  ?   Chest wall: No tenderness.  ?Musculoskeletal:  ?   Cervical back: Neck supple.  ?Lymphadenopathy:  ?   Cervical: No cervical adenopathy.  ?Skin: ?   Capillary Refill: Capillary refill takes less than 2 seconds.  ?   Coloration: Skin is not jaundiced or pale.  ?Neurological:  ?   General: No focal deficit present.  ?   Mental Status: She is alert and oriented to person, place, and time.  ?Psychiatric:     ?   Behavior: Behavior normal.  ? ? ? ?UC Treatments / Results  ?Labs ?(all labs ordered are listed, but only abnormal results are displayed) ?Labs  Reviewed  ?CULTURE, GROUP A STREP [facility])  ?COVID-19, FLU A+B [facility]  ?POCT RAPID STREP A (OFFICE)  ? ? ?EKG ? ? ?Radiology ?No results found. ? ?Procedures ?Procedures (including critical care time) ? ?Medications Ordered in

## 2022-03-28 NOTE — ED Triage Notes (Signed)
Pt c/o sore throat, chest congestion, ear ache, nausea, diarrhea, body aches  ? ?Denies headaches, cough,  ? ?Onset ~ yesterday  ?

## 2022-03-30 ENCOUNTER — Other Ambulatory Visit: Payer: Self-pay | Admitting: Pulmonary Disease

## 2022-03-30 ENCOUNTER — Telehealth: Payer: 59 | Admitting: Nurse Practitioner

## 2022-03-30 DIAGNOSIS — J4521 Mild intermittent asthma with (acute) exacerbation: Secondary | ICD-10-CM | POA: Diagnosis not present

## 2022-03-30 DIAGNOSIS — R051 Acute cough: Secondary | ICD-10-CM | POA: Diagnosis not present

## 2022-03-30 LAB — COVID-19, FLU A+B NAA
Influenza A, NAA: NOT DETECTED
Influenza B, NAA: NOT DETECTED
SARS-CoV-2, NAA: NOT DETECTED

## 2022-03-30 MED ORDER — BELSOMRA 10 MG PO TABS: 10.0000 mg | ORAL_TABLET | Freq: Every day | ORAL | 1 refills | Status: DC

## 2022-03-30 NOTE — Telephone Encounter (Signed)
Prescription for Belsomra sent in place of Ambien ?

## 2022-03-30 NOTE — Telephone Encounter (Signed)
Pt states the [facility] the Belsomra was sent to does not have med in stock, they will get med in on 4/11. Pt is requesting Belsomra be sent to [facility] on [address] in [place] (pharmacy changed in chart for escript) so she doesn't have to wait until next week to fill.  ? ? ?Dr. Wynona Neat, please advise. Thanks.  ?

## 2022-03-30 NOTE — Telephone Encounter (Signed)
Dr. Wynona Neat please advise on patient mychart message. Thank you  ? ? ?Is there somthing that is stronger than the [place] I was on ? I'm maybe getting 2 hours asleep and then up it's like it won't keep me asleep and I'm starting to drag from lack of sleep. ?

## 2022-03-31 LAB — CULTURE, GROUP A STREP (THRC)

## 2022-03-31 MED ORDER — PREDNISONE 20 MG PO TABS: 40.0000 mg | ORAL_TABLET | Freq: Every day | ORAL | 0 refills | Status: AC

## 2022-03-31 MED ORDER — PROMETHAZINE-DM 6.25-15 MG/5ML PO SYRP: 5.0000 mL | ORAL_SOLUTION | Freq: Four times a day (QID) | ORAL | 0 refills | Status: DC | PRN

## 2022-03-31 NOTE — Progress Notes (Signed)
We are sorry that you are not feeling well.  Here is how we plan to help! ? ?Based on your presentation I believe you most likely have A cough due to a virus.  This is called viral bronchitis and is best treated by rest, plenty of fluids and control of the cough.  You may use Ibuprofen or Tylenol as directed to help your symptoms.   ?  ?In addition I have called in promethazine DM 1 tsp 6hrs prn- be careful, this can caused drowsiness. ? ?Prednisone 20mg  2 rablets at rthe same time daily for 5 days. ? ?From your responses in the eVisit questionnaire you describe inflammation in the upper respiratory tract which is causing a significant cough.  This is commonly called Bronchitis and has four common causes:   ?Allergies ?Viral Infections ?Acid Reflux ?Bacterial Infection ?Allergies, viruses and acid reflux are treated by controlling symptoms or eliminating the cause. An example might be a cough caused by taking certain blood pressure medications. You stop the cough by changing the medication. Another example might be a cough caused by acid reflux. Controlling the reflux helps control the cough. ? ?USE OF BRONCHODILATOR ("RESCUE") INHALERS: ?There is a risk from using your bronchodilator too frequently.  The risk is that over-reliance on a medication which only relaxes the muscles surrounding the breathing tubes can reduce the effectiveness of medications prescribed to reduce swelling and congestion of the tubes themselves.  Although you feel brief relief from the bronchodilator inhaler, your asthma may actually be worsening with the tubes becoming more swollen and filled with mucus.  This can delay other crucial treatments, such as oral steroid medications. If you need to use a bronchodilator inhaler daily, several times per day, you should discuss this with your provider.  There are probably better treatments that could be used to keep your asthma under control.  ?   ?HOME CARE ?Only take medications as instructed by  your medical team. ?Complete the entire course of an antibiotic. ?Drink plenty of fluids and get plenty of rest. ?Avoid close contacts especially the very young and the elderly ?Cover your mouth if you cough or cough into your sleeve. ?Always remember to wash your hands ?A steam or ultrasonic humidifier can help congestion.  ? ?GET HELP RIGHT AWAY IF: ?You develop worsening fever. ?You become short of breath ?You cough up blood. ?Your symptoms persist after you have completed your treatment plan ?MAKE SURE YOU  ?Understand these instructions. ?Will watch your condition. ?Will get help right away if you are not doing well or get worse. ?  ? ?Thank you for choosing an e-visit. ? ?Your e-visit answers were reviewed by a board certified advanced clinical practitioner to complete your personal care plan. Depending upon the condition, your plan could have included both over the counter or prescription medications. ? ?Please review your pharmacy choice. Make sure the pharmacy is open so you can pick up prescription now. If there is a problem, you may contact your provider through and have the prescription routed to another pharmacy.  Your safety is important to [employer]. If you have drug allergies check your prescription carefully.  ? ?For the next 24 hours you can use MyChart to ask questions about today's visit, request a non-urgent call back, or ask for a work or school excuse. ?You will get an email in the next two days asking about your experience. I hope that your e-visit has been valuable and will speed your recovery. ? ?  5-10 minutes spent reviewing and documenting in chart. ? ?

## 2022-04-03 ENCOUNTER — Other Ambulatory Visit: Payer: Self-pay | Admitting: Pulmonary Disease

## 2022-04-03 ENCOUNTER — Encounter: Payer: Self-pay | Admitting: Pulmonary Disease

## 2022-04-03 ENCOUNTER — Telehealth: Payer: 59 | Admitting: Physician Assistant

## 2022-04-03 DIAGNOSIS — R059 Cough, unspecified: Secondary | ICD-10-CM

## 2022-04-03 MED ORDER — BELSOMRA 10 MG PO TABS: 10.0000 mg | ORAL_TABLET | Freq: Every day | ORAL | 2 refills | Status: DC

## 2022-04-03 NOTE — Progress Notes (Signed)
Based on what you shared with me, I feel your condition warrants further evaluation and I recommend that you be seen in a face to face visit. ? ?On chart review, it appears  you have been seen several times for a cough and upper respiratory complaints and despite appropriate treatment your symptoms are persisting. Unfortunately at this time and evisit does not appear to be the most appropriate way to treat your symptoms and I feel that you need to be seen in person and have a physical exam completed including a lung exam. You may also need to have a chest xray. ?  ?NOTE: There will be NO CHARGE for this eVisit ?  ?If you are having a true medical emergency please call 911.   ?  ? For an urgent face to face visit, [facility] has six urgent care centers for your convenience:  ?  ? [facility] at [facility] ?Get Driving Directions ?[phone] ?[phone] ?[place], [place] [address] ?  ? [facility]) ?Get Driving Directions ?[phone] ?[address] ?[place], [place] [address] ? ?[facility] [place]) ?Get Driving Directions ?[phone] ?[address] ?[place],  [place]  [address] ? ?[facility] ?Get Driving Directions ?[phone] ?[address], [address] ?[place], [place] [address] ?  ?[facility] [place] ?Get Driving Directions  ?[phone] ?[address] ?[address] ?[place], [place] [address] ?  ? Urgent Care at [facility] ?Get Driving Directions ?[phone] ?[address] ?[place], [place] [address] ? ?Your MyChart E-visit questionnaire answers were reviewed by a board certified advanced clinical practitioner to complete your personal care plan based on your specific symptoms.  Thank you for using e-Visits. ?  ?Approximately 5 minutes was spent documenting and reviewing patient's chart. ? ?

## 2022-04-04 ENCOUNTER — Encounter: Payer: 59 | Admitting: Obstetrics & Gynecology

## 2022-04-04 NOTE — Telephone Encounter (Signed)
See MyChart message from 04/03/22.  ? ?

## 2022-04-05 NOTE — Telephone Encounter (Signed)
Looks like pt usually sees Dr. Sherene Sires for asthma but has been seeing Beth for insomnia problems. Meds have been refilled for her by Dr. Val Eagle and by Waynetta Sandy. With Beth not available this afternoon and until Tuesday, sending this to Dr. Val Eagle to see if he is okay refilling pt's Ambien. Ambien is not currently showing on pt's current med list. ? ?Please advise. ?

## 2022-04-09 ENCOUNTER — Other Ambulatory Visit: Payer: Self-pay | Admitting: Pulmonary Disease

## 2022-04-09 MED ORDER — ZOLPIDEM TARTRATE ER 12.5 MG PO TBCR: 12.5000 mg | EXTENDED_RELEASE_TABLET | Freq: Every evening | ORAL | 0 refills | Status: DC | PRN

## 2022-04-09 NOTE — Progress Notes (Unsigned)
Belsomra not working, requested to change to Ambien ?

## 2022-04-24 ENCOUNTER — Ambulatory Visit: Payer: 59 | Admitting: Dietician

## 2022-04-24 ENCOUNTER — Telehealth: Payer: Medicaid Other | Admitting: Physician Assistant

## 2022-04-24 DIAGNOSIS — H66003 Acute suppurative otitis media without spontaneous rupture of ear drum, bilateral: Secondary | ICD-10-CM

## 2022-04-25 MED ORDER — NEOMYCIN-POLYMYXIN-HC 3.5-10000-1 OT SOLN: 3.0000 [drp] | Freq: Four times a day (QID) | OTIC | 0 refills | Status: DC

## 2022-04-25 MED ORDER — DOXYCYCLINE HYCLATE 100 MG PO TABS: 100.0000 mg | ORAL_TABLET | Freq: Two times a day (BID) | ORAL | 0 refills | Status: DC

## 2022-04-25 NOTE — Addendum Note (Signed)
Addended by: Margaretann Loveless on: 04/25/2022 11:05 AM ? ? Modules accepted: Orders ? ?

## 2022-04-25 NOTE — Progress Notes (Signed)
E Visit for Swimmer's Ear ? ?We are sorry that you are not feeling well. Here is how we plan to help! ? ?I have prescribed: Ciprofloxin 0.2% and hydrocortisone 1% otic suspension 3 drops in affected ears twice daily for 7 days ? ?Based on what you have told me you may have a bacterial infection. In addition to the ear drops I have prescribed an oral antibiotic: and Doxycycline 100mg  one tablet by mouth twice a day for 10 days ? ?In certain cases swimmer's ear may progress to a more serious bacterial infection of the middle or inner ear.  If you have a fever 102 and up and significantly worsening symptoms, this could indicate a more serious infection moving to the middle/inner and needs face to face evaluation in an office by a provider. ? ?Your symptoms should improve over the next 3 days and should resolve in about 7 days. ? ?HOME CARE: ? ?Wash your hands frequently. ?Do not place the tip of the bottle on your ear or touch it with your fingers. ?You can take Acetominophen 650 mg every 4-6 hours as needed for pain.  If pain is severe or moderate, you can apply a heating pad (set on low) or hot water bottle (wrapped in a towel) to outer ear for 20 minutes.  This will also increase drainage. ?Avoid ear plugs ?Do not use Q-tips ?After showers, help the water run out by tilting your head to one side. ? ?GET HELP RIGHT AWAY IF: ? ?Fever is over 102.2 degrees. ?You develop progressive ear pain or hearing loss. ?Ear symptoms persist longer than 3 days after treatment. ? ?MAKE SURE YOU: ? ?Understand these instructions. ?Will watch your condition. ?Will get help right away if you are not doing well or get worse. ? ?TO PREVENT SWIMMER'S EAR: ?Use a bathing cap or custom fitted swim molds to keep your ears dry. ?Towel off after swimming to dry your ears. ?Tilt your head or pull your earlobes to allow the water to escape your ear canal. ?If there is still water in your ears, consider using a hairdryer on the lowest  setting. ? ? ?Thank you for choosing an e-visit. ? ?Your e-visit answers were reviewed by a board certified advanced clinical practitioner to complete your personal care plan. Depending upon the condition, your plan could have included both over the counter or prescription medications. ? ?Please review your pharmacy choice. Make sure the pharmacy is open so you can pick up prescription now. If there is a problem, you may contact your provider through [employer] and have the prescription routed to another pharmacy.  Your safety is important to [place]. If you have drug allergies check your prescription carefully.  ? ?For the next 24 hours you can use MyChart to ask questions about today's visit, request a non-urgent call back, or ask for a work or school excuse. ?You will get an email in the next two days asking about your experience. I hope that your e-visit has been valuable and will speed your recovery. ? ? ? ?I provided 5 minutes of non face-to-face time during this encounter for chart review and documentation.  ? ?

## 2022-04-26 ENCOUNTER — Other Ambulatory Visit: Payer: Self-pay | Admitting: Internal Medicine

## 2022-04-26 DIAGNOSIS — J4540 Moderate persistent asthma, uncomplicated: Secondary | ICD-10-CM

## 2022-05-09 ENCOUNTER — Telehealth: Payer: Medicaid Other | Admitting: Physician Assistant

## 2022-05-09 DIAGNOSIS — J208 Acute bronchitis due to other specified organisms: Secondary | ICD-10-CM

## 2022-05-09 DIAGNOSIS — J4541 Moderate persistent asthma with (acute) exacerbation: Secondary | ICD-10-CM

## 2022-05-09 MED ORDER — PREDNISONE 20 MG PO TABS: 40.0000 mg | ORAL_TABLET | Freq: Every day | ORAL | 0 refills | Status: AC

## 2022-05-09 MED ORDER — BENZONATATE 100 MG PO CAPS: 100.0000 mg | ORAL_CAPSULE | Freq: Three times a day (TID) | ORAL | 0 refills | Status: DC | PRN

## 2022-05-09 NOTE — Progress Notes (Signed)
I have spent 5 minutes in review of e-visit questionnaire, review and updating patient chart, medical decision making and response to patient.   Klever Twyford Cody Keziyah Kneale, PA-C    

## 2022-05-09 NOTE — Progress Notes (Signed)
We are sorry that you are not feeling well.  Here is how we plan to help! ? ?Based on your presentation I believe you most likely have A cough due to a virus.  This is called viral bronchitis and is best treated by rest, plenty of fluids and control of the cough.  You may use Ibuprofen or Tylenol as directed to help your symptoms.   ?  ?In addition you may use A prescription cough medication called Tessalon Perles 100mg . You may take 1-2 capsules every 8 hours as needed for your cough. ? ?I have also sent in prednisone 40 mg to take daily for 5 days for breathing and bronchospasm.  ? ?From your responses in the eVisit questionnaire you describe inflammation in the upper respiratory tract which is causing a significant cough.  This is commonly called Bronchitis and has four common causes:   ?Allergies ?Viral Infections ?Acid Reflux ?Bacterial Infection ?Allergies, viruses and acid reflux are treated by controlling symptoms or eliminating the cause. An example might be a cough caused by taking certain blood pressure medications. You stop the cough by changing the medication. Another example might be a cough caused by acid reflux. Controlling the reflux helps control the cough. ? ?USE OF BRONCHODILATOR ("RESCUE") INHALERS: ?There is a risk from using your bronchodilator too frequently.  The risk is that over-reliance on a medication which only relaxes the muscles surrounding the breathing tubes can reduce the effectiveness of medications prescribed to reduce swelling and congestion of the tubes themselves.  Although you feel brief relief from the bronchodilator inhaler, your asthma may actually be worsening with the tubes becoming more swollen and filled with mucus.  This can delay other crucial treatments, such as oral steroid medications. If you need to use a bronchodilator inhaler daily, several times per day, you should discuss this with your provider.  There are probably better treatments that could be used to  keep your asthma under control.  ?   ?HOME CARE ?Only take medications as instructed by your medical team. ?Complete the entire course of an antibiotic. ?Drink plenty of fluids and get plenty of rest. ?Avoid close contacts especially the very young and the elderly ?Cover your mouth if you cough or cough into your sleeve. ?Always remember to wash your hands ?A steam or ultrasonic humidifier can help congestion.  ? ?GET HELP RIGHT AWAY IF: ?You develop worsening fever. ?You become short of breath ?You cough up blood. ?Your symptoms persist after you have completed your treatment plan ?MAKE SURE YOU  ?Understand these instructions. ?Will watch your condition. ?Will get help right away if you are not doing well or get worse. ?  ? ?Thank you for choosing an e-visit. ? ?Your e-visit answers were reviewed by a board certified advanced clinical practitioner to complete your personal care plan. Depending upon the condition, your plan could have included both over the counter or prescription medications. ? ?Please review your pharmacy choice. Make sure the pharmacy is open so you can pick up prescription now. If there is a problem, you may contact your provider through and have the prescription routed to another pharmacy.  Your safety is important to [employer]. If you have drug allergies check your prescription carefully.  ? ?For the next 24 hours you can use MyChart to ask questions about today's visit, request a non-urgent call back, or ask for a work or school excuse. ?You will get an email in the next two days asking about your  experience. I hope that your e-visit has been valuable and will speed your recovery. ? ?

## 2022-05-29 ENCOUNTER — Other Ambulatory Visit: Payer: Self-pay | Admitting: Family

## 2022-05-29 ENCOUNTER — Other Ambulatory Visit: Payer: Self-pay | Admitting: Pulmonary Disease

## 2022-05-29 DIAGNOSIS — R000 Tachycardia, unspecified: Secondary | ICD-10-CM

## 2022-05-29 MED ORDER — ZOLPIDEM TARTRATE ER 12.5 MG PO TBCR: 12.5000 mg | EXTENDED_RELEASE_TABLET | Freq: Every evening | ORAL | 0 refills | Status: DC | PRN

## 2022-05-30 ENCOUNTER — Telehealth: Payer: Medicaid Other | Admitting: Physician Assistant

## 2022-05-30 DIAGNOSIS — J208 Acute bronchitis due to other specified organisms: Secondary | ICD-10-CM

## 2022-05-30 MED ORDER — PREDNISONE 20 MG PO TABS: 40.0000 mg | ORAL_TABLET | Freq: Every day | ORAL | 0 refills | Status: AC

## 2022-05-30 MED ORDER — PROMETHAZINE-DM 6.25-15 MG/5ML PO SYRP: 5.0000 mL | ORAL_SOLUTION | Freq: Four times a day (QID) | ORAL | 0 refills | Status: DC | PRN

## 2022-05-30 NOTE — Progress Notes (Signed)
We are sorry that you are not feeling well.  Here is how we plan to help!  Based on your presentation I believe you most likely have A cough due to a virus.  This is called viral bronchitis and is best treated by rest, plenty of fluids and control of the cough.  You may use Ibuprofen or Tylenol as directed to help your symptoms.     In addition you may use a prescription cough syrup -- promethazine-dm -- that I have sent to your pharmacy.  I have also sent in a 5-day burst of prednisone to help calm down bronchospasm and open airways.   From your responses in the eVisit questionnaire you describe inflammation in the upper respiratory tract which is causing a significant cough.  This is commonly called Bronchitis and has four common causes:   Allergies Viral Infections Acid Reflux Bacterial Infection Allergies, viruses and acid reflux are treated by controlling symptoms or eliminating the cause. An example might be a cough caused by taking certain blood pressure medications. You stop the cough by changing the medication. Another example might be a cough caused by acid reflux. Controlling the reflux helps control the cough.  USE OF BRONCHODILATOR ("RESCUE") INHALERS: There is a risk from using your bronchodilator too frequently.  The risk is that over-reliance on a medication which only relaxes the muscles surrounding the breathing tubes can reduce the effectiveness of medications prescribed to reduce swelling and congestion of the tubes themselves.  Although you feel brief relief from the bronchodilator inhaler, your asthma may actually be worsening with the tubes becoming more swollen and filled with mucus.  This can delay other crucial treatments, such as oral steroid medications. If you need to use a bronchodilator inhaler daily, several times per day, you should discuss this with your provider.  There are probably better treatments that could be used to keep your asthma under control.     HOME  CARE Only take medications as instructed by your medical team. Complete the entire course of an antibiotic. Drink plenty of fluids and get plenty of rest. Avoid close contacts especially the very young and the elderly Cover your mouth if you cough or cough into your sleeve. Always remember to wash your hands A steam or ultrasonic humidifier can help congestion.   GET HELP RIGHT AWAY IF: You develop worsening fever. You become short of breath You cough up blood. Your symptoms persist after you have completed your treatment plan MAKE SURE YOU  Understand these instructions. Will watch your condition. Will get help right away if you are not doing well or get worse.    Thank you for choosing an e-visit.  Your e-visit answers were reviewed by a board certified advanced clinical practitioner to complete your personal care plan. Depending upon the condition, your plan could have included both over the counter or prescription medications.  Please review your pharmacy choice. Make sure the pharmacy is open so you can pick up prescription now. If there is a problem, you may contact your provider through [employer] and have the prescription routed to another pharmacy.  Your safety is important to [place]. If you have drug allergies check your prescription carefully.   For the next 24 hours you can use MyChart to ask questions about today's visit, request a non-urgent call back, or ask for a work or school excuse. You will get an email in the next two days asking about your experience. I hope that your e-visit has  been valuable and will speed your recovery.

## 2022-05-30 NOTE — Progress Notes (Signed)
I have spent 5 minutes in review of e-visit questionnaire, review and updating patient chart, medical decision making and response to patient.   Joycelynn Fritsche Cody Britteney Ayotte, PA-C    

## 2022-05-31 MED ORDER — METOPROLOL SUCCINATE ER 25 MG PO TB24: ORAL_TABLET | ORAL | 0 refills | Status: DC

## 2022-06-25 ENCOUNTER — Telehealth: Payer: Medicaid Other | Admitting: Physician Assistant

## 2022-06-25 DIAGNOSIS — K0889 Other specified disorders of teeth and supporting structures: Secondary | ICD-10-CM

## 2022-06-26 MED ORDER — CLINDAMYCIN HCL 300 MG PO CAPS: 300.0000 mg | ORAL_CAPSULE | Freq: Four times a day (QID) | ORAL | 0 refills | Status: AC

## 2022-06-26 NOTE — Addendum Note (Signed)
Addended by: Waldon Merl on: 06/26/2022 07:18 AM   Modules accepted: Orders

## 2022-06-26 NOTE — Progress Notes (Signed)
I have spent 5 minutes in review of e-visit questionnaire, review and updating patient chart, medical decision making and response to patient.   Meldrick Buttery Cody Jody Silas, PA-C    

## 2022-06-26 NOTE — Progress Notes (Addendum)
E-Visit for Dental Pain  We are sorry that you are not feeling well.  Here is how we plan to help!  Based on what you have shared with me in the questionnaire, it sounds like you have a dental infection/abscess causing significant pain.   I have prescribed Clindamycin 300mg  4 times per day for 5 days to try to calm things down until you can be evaluated in person. If anything worsens despite treatment, you must be seen in person even if at ER.   It is imperative that you see a dentist within 10 days of this eVisit to determine the cause of the dental pain and be sure it is adequately treated  A toothache or tooth pain is caused when the nerve in the root of a tooth or surrounding a tooth is irritated. Dental (tooth) infection, decay, injury, or loss of a tooth are the most common causes of dental pain. Pain may also occur after an extraction (tooth is pulled out). Pain sometimes originates from other areas and radiates to the jaw, thus appearing to be tooth pain.Bacteria growing inside your mouth can contribute to gum disease and dental decay, both of which can cause pain. A toothache occurs from inflammation of the central portion of the tooth called pulp. The pulp contains nerve endings that are very sensitive to pain. Inflammation to the pulp or pulpitis may be caused by dental cavities, trauma, and infection.    HOME CARE:   For toothaches: Over-the-counter pain medications such as acetaminophen or ibuprofen may be used. Take these as directed on the package while you arrange for a dental appointment. Avoid very cold or hot foods, because they may make the pain worse. You may get relief from biting on a cotton ball soaked in oil of cloves. You can get oil of cloves at most drug stores.  For jaw pain:  Aspirin may be helpful for problems in the joint of the jaw in adults. If pain happens every time you open your mouth widely, the temporomandibular joint (TMJ) may be the source of the pain.  Yawning or taking a large bite of food may worsen the pain. An appointment with your doctor or dentist will help you find the cause.     GET HELP RIGHT AWAY IF:  You have a high fever or chills If you have had a recent head or face injury and develop headache, light headedness, nausea, vomiting, or other symptoms that concern you after an injury to your face or mouth, you could have a more serious injury in addition to your dental injury. A facial rash associated with a toothache: This condition may improve with medication. Contact your doctor for them to decide what is appropriate. Any jaw pain occurring with chest pain: Although jaw pain is most commonly caused by dental disease, it is sometimes referred pain from other areas. People with heart disease, especially people who have had stents placed, people with diabetes, or those who have had heart surgery may have jaw pain as a symptom of heart attack or angina. If your jaw or tooth pain is associated with lightheadedness, sweating, or shortness of breath, you should see a doctor as soon as possible. Trouble swallowing or excessive pain or bleeding from gums: If you have a history of a weakened immune system, diabetes, or steroid use, you may be more susceptible to infections. Infections can often be more severe and extensive or caused by unusual organisms. Dental and gum infections in people with these  conditions may require more aggressive treatment. An abscess may need draining or IV antibiotics, for example.  MAKE SURE YOU   Understand these instructions. Will watch your condition. Will get help right away if you are not doing well or get worse.  Thank you for choosing an e-visit.  Your e-visit answers were reviewed by a board certified advanced clinical practitioner to complete your personal care plan. Depending upon the condition, your plan could have included both over the counter or prescription medications.  Please review your  pharmacy choice. Make sure the pharmacy is open so you can pick up prescription now. If there is a problem, you may contact your provider through [employer] and have the prescription routed to another pharmacy.  Your safety is important to [place]. If you have drug allergies check your prescription carefully.   For the next 24 hours you can use MyChart to ask questions about today's visit, request a non-urgent call back, or ask for a work or school excuse. You will get an email in the next two days asking about your experience. I hope that your e-visit has been valuable and will speed your recovery.

## 2022-06-27 ENCOUNTER — Other Ambulatory Visit: Payer: Self-pay | Admitting: Family

## 2022-06-27 DIAGNOSIS — J4540 Moderate persistent asthma, uncomplicated: Secondary | ICD-10-CM

## 2022-06-28 NOTE — Telephone Encounter (Signed)
Requested medication (s) are due for refill today:   No  Requested medication (s) are on the active medication list:   No  Future visit scheduled:   No   Last ordered: 08/14/2021 discontinued and not on medication list reason returned.   Requested Prescriptions  Pending Prescriptions Disp Refills   budesonide-formoterol (SYMBICORT) 160-4.5 MCG/ACT inhaler [Pharmacy Med Name: BUDESONIDE/FORM 160/4.5MCG(120 INH)] 10.2 g     Sig: INHALE 2 PUFFS INTO THE LUNGS TWICE DAILY     Pulmonology:  [facility] [employer] Passed - 06/27/2022  8:26 PM      Passed - Valid encounter within last 12 months    Recent Outpatient Visits           3 months ago Great toe pain, right   [facility] Care at [facility], Amy J, NP   5 months ago Acute right-sided low back pain with right-sided sciatica   [facility] Care at [facility], Amy J, NP   8 months ago Acute right-sided low back pain with right-sided sciatica   [facility] at [facility], Gildardo Pounds, NP   10 months ago Generalized anxiety disorder   [facility] Care at [facility], Amy J, NP   10 months ago Generalized anxiety disorder   [facility] Care at [facility], Salomon Fick, NP

## 2022-07-02 ENCOUNTER — Telehealth: Payer: Self-pay | Admitting: Internal Medicine

## 2022-07-02 MED ORDER — FLUTICASONE-SALMETEROL 115-21 MCG/ACT IN AERO: 2.0000 | INHALATION_SPRAY | Freq: Two times a day (BID) | RESPIRATORY_TRACT | 12 refills | Status: DC

## 2022-07-02 NOTE — Telephone Encounter (Signed)
Advair 115 2 bid (generic ok)

## 2022-07-02 NOTE — Telephone Encounter (Signed)
Fax received from pt's pharmacy stating that the Elwin Sleight is not covered by pt's insurance. Preferred alternative is Wixela, Breo, fluticasone-salmeterol.  Dr. Sherene Sires, please advise on this.

## 2022-07-28 ENCOUNTER — Encounter: Payer: Self-pay | Admitting: Family

## 2022-07-30 ENCOUNTER — Other Ambulatory Visit: Payer: Self-pay | Admitting: Family

## 2022-07-30 DIAGNOSIS — M79671 Pain in right foot: Secondary | ICD-10-CM

## 2022-08-02 ENCOUNTER — Telehealth: Payer: Commercial Managed Care - HMO | Admitting: Physician Assistant

## 2022-08-02 DIAGNOSIS — G8929 Other chronic pain: Secondary | ICD-10-CM

## 2022-08-02 NOTE — Progress Notes (Signed)
Because of ongoing back issues and nerve involvement, this is above the scope of what we can assess and manage via e-visit and, so, I feel your condition warrants further evaluation and I recommend that you be seen in a face to face visit.   NOTE: There will be NO CHARGE for this eVisit   If you are having a true medical emergency please call 911.      For an urgent face to face visit, [facility] has seven urgent care centers for your convenience:     [facility] at [facility] Directions [phone] [address] [place], [place] [address]    [facility]) Get Driving Directions [phone] [address] [place], [place] [address]  [facility]) Get Driving Directions [phone] [address] [place],  [place]  [address]  [facility] [employer] Directions  [phone] [address] [place],  [place] [address]   [facility] Get Driving Directions [phone] [address] [place], [place] [address]   [facility] at [facility] Get Driving Directions  [phone] [address] [place], [place] [address]   [facility] at [facility] Directions [phone] [address] [place], [place] [address]  Your MyChart E-visit questionnaire answers were reviewed by a board certified advanced clinical practitioner to complete your personal care plan based on your specific symptoms.  Thank you for using e-Visits.

## 2022-08-03 NOTE — Progress Notes (Unsigned)
Patient ID: Tina Manning, female    DOB: 24-Feb-1991  MRN: [id number]  CC: No chief complaint on file.   Subjective: Tina Manning is a 31 y.o. 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   Hyperlipidemia 08/15/2021   Prediabetes 08/15/2021   Morbid obesity due to excess calories (HCC) 08/15/2021   Closed fracture of lower end of right radius with routine healing    Anxiety and depression 08/09/2011   Endometriosis 03/27/2009   Tachycardia 11/27/1998   Asthma 04/24/1994     Current Outpatient Medications on File Prior to Visit  Medication Sig Dispense Refill   albuterol (VENTOLIN HFA) 108 (90 Base) MCG/ACT inhaler INHALE 1-2 PUFFS BY MOUTH EVERY 6 HOURS AS NEEDED FOR WHEEZE OR SHORTNESS OF BREATH 8.5 each 2   atorvastatin (LIPITOR) 40 MG tablet TAKE 1 TABLET(40 MG) BY MOUTH DAILY 90 tablet 0   benzonatate (TESSALON) 100 MG capsule Take 1 capsule (100 mg total) by mouth 3 (three) times daily as needed for cough. 30 capsule 0   busPIRone (BUSPAR) 5 MG tablet Take 1 tablet (5 mg total) by mouth 2 (two) times daily. (Patient taking differently: Take 10 mg by mouth 2 (two) times daily.) 60 tablet 1   doxycycline (VIBRA-TABS) 100 MG tablet Take 1 tablet (100 mg total) by mouth 2 (two) times daily. 20 tablet 0   fluticasone (FLONASE) 50 MCG/ACT nasal spray Place 2 sprays into both nostrils daily. 16 g 6   fluticasone-salmeterol (ADVAIR HFA) 115-21 MCG/ACT inhaler Inhale 2 puffs into the lungs 2 (two) times daily. 1 each 12   hydrOXYzine (VISTARIL) 50 MG capsule Take 1 capsule (50  mg total) by mouth every 8 (eight) hours as needed. 30 capsule 0   ibuprofen (ADVIL) 600 MG tablet Take 1 tablet (600 mg total) by mouth every 8 (eight) hours as needed. 30 tablet 0   ipratropium-albuterol (DUONEB) 0.5-2.5 (3) MG/3ML SOLN Take 3 mLs by nebulization in the morning, at noon, in the evening, and at bedtime. (Patient taking differently: Take 3 mLs by nebulization every 4 (four) hours as needed (Asthma).) 360 mL 2   metFORMIN (GLUCOPHAGE) 500 MG tablet Take 1 tablet (500 mg total) by mouth 2 (two) times daily with a meal. 60 tablet 5   metoprolol succinate (TOPROL-XL) 25 MG 24 hr tablet TAKE 1 TABLET(25 MG) BY MOUTH DAILY 90 tablet 0   mometasone-formoterol (DULERA) 200-5 MCG/ACT AERO Take 2 puffs first thing in am and then another 2 puffs about 12 hours later. 1 each 11   montelukast (SINGULAIR) 10 MG tablet Take 1 tablet (10 mg total) by mouth at bedtime. 30 tablet 11   neomycin-polymyxin-hydrocortisone (CORTISPORIN) OTIC solution Place 3 drops into both ears 4 (four) times daily. X 7 days 10 mL 0   pantoprazole (PROTONIX) 40 MG tablet TAKE 1 TABLET(40 MG) BY MOUTH DAILY 30 tablet 5   promethazine-dextromethorphan (PROMETHAZINE-DM) 6.25-15 MG/5ML syrup Take 5 mLs by mouth 4 (four) times daily as needed for cough. 118 mL 0   sertraline (ZOLOFT) 100 MG tablet Take 1 tablet (100  mg total) by mouth daily. 30 tablet 1   traMADol (ULTRAM) 50 MG tablet Take 2 tablets (100 mg total) by mouth every 6 (six) hours as needed for severe pain. 60 tablet 0   zolpidem (AMBIEN CR) 12.5 MG CR tablet Take 1 tablet (12.5 mg total) by mouth at bedtime as needed for sleep. 30 tablet 0   No current facility-administered medications on file prior to visit.    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    Birth control/protection: None  Other Topics Concern   Not on file  Social History Narrative   Not on file   Social Determinants of Health   Financial Resource Strain: Not on file  Food Insecurity: Not on file  Transportation Needs: Not on file  Physical Activity: Not on file  Stress: Not on file  Social Connections: Not on file  Intimate Partner Violence: Not on file    Family History  Problem Relation Age of Onset   Heart attack Mother    Hypertension Mother    Breast cancer Mother    Polycystic ovary syndrome Mother    Heart disease Father    Hypertension Father    Liver cancer Father     Past Surgical History:  Procedure Laterality Date   CHOLECYSTECTOMY     DILITATION & CURRETTAGE/HYSTROSCOPY WITH NOVASURE ABLATION N/A 03/05/2022   Procedure: DILATATION & CURETTAGE/HYSTEROSCOPY WITH NOVASURE ABLATION;  Surgeon: Willodean Rosenthal, MD;  Location: [facility] OR;  Service: Gynecology;  Laterality: N/A;   OPEN REDUCTION INTERNAL FIXATION (ORIF) DISTAL RADIAL FRACTURE Right 05/15/2021   Procedure: OPEN REDUCTION INTERNAL FIXATION (ORIF)RIGHT  DISTAL RADIAL FRACTURE;  Surgeon: Cammy Copa, MD;  Location: [facility] OR;  Service: Orthopedics;  Laterality: Right;   TYMPANOSTOMY TUBE PLACEMENT     as a baby    ROS: Review of Systems Negative except as stated above  PHYSICAL EXAM: There were no vitals taken for this visit.  Physical Exam  {female adult master:[id number]} {female adult master:[id number]}     Latest Ref Rng & Units 03/05/2022    9:27 AM 11/13/2021    9:49 AM  08/14/2021    5:24 PM  CMP  Glucose 70 - 99 mg/dL 90  96    BUN 6 - 20 mg/dL 8  11    Creatinine 1.61 - 1.00 mg/dL 0.96  0.45    Sodium 409 - 145 mmol/L 137  137    Potassium 3.5 - 5.1 mmol/L 4.1  3.8    Chloride 98 - 111 mmol/L 105  107    CO2 22 - 32 mmol/L 23  22    Calcium 8.9 - 10.3 mg/dL 9.1  8.5    Total Protein 6.0 - 8.5 g/dL   6.4   Total Bilirubin 0.0 - 1.2  mg/dL   <1.6   Alkaline Phos 44 - 121 IU/L   96   AST 0 - 40 IU/L   19   ALT 0 - 32 IU/L   24    Lipid Panel     Component Value Date/Time   CHOL 211 (H) 08/14/2021 1724   TRIG 188 (H) 08/14/2021 1724   HDL 38 (L) 08/14/2021 1724   CHOLHDL 5.6 (H) 08/14/2021 1724   LDLCALC 139 (H) 08/14/2021 1724    CBC    Component Value Date/Time   WBC 15.2 (H) 03/07/2022 1331   WBC 11.0 (H) 03/05/2022 0927   RBC 4.93 03/07/2022 1331   RBC 4.90 03/05/2022 0927   HGB 12.2 03/07/2022 1331   HCT 39.3 03/07/2022 1331   PLT 489 (H) 03/07/2022 1331   MCV 80 03/07/2022 1331   MCH 24.7 (L) 03/07/2022 1331   MCH 25.1 (L) 03/05/2022 0927   MCHC 31.0 (L) 03/07/2022 1331   MCHC 31.2 03/05/2022 0927   RDW 15.6 (H) 03/07/2022 1331   LYMPHSABS 1.9 11/13/2021 0949   MONOABS 0.7 11/13/2021 0949   EOSABS 0.6 11/13/2021 0949   BASOSABS 0.1 11/13/2021 0949    ASSESSMENT AND PLAN:  There are no diagnoses linked to this encounter.   Patient was given the opportunity to ask questions.  Patient verbalized understanding of the plan and was able to repeat key elements of the plan. Patient was given clear instructions to go to Emergency Department or return to medical center if symptoms don't improve, worsen, or new problems develop.The patient verbalized understanding.   No orders of the defined types were placed in this encounter.    Requested Prescriptions    No prescriptions requested or ordered in this encounter    No follow-ups on file.  Rema Fendt, NP

## 2022-08-08 ENCOUNTER — Encounter: Payer: Commercial Managed Care - HMO | Admitting: Family

## 2022-08-08 NOTE — Telephone Encounter (Signed)
Needs repeat appointment for re-eval

## 2022-08-10 DIAGNOSIS — J4540 Moderate persistent asthma, uncomplicated: Secondary | ICD-10-CM

## 2022-08-10 MED ORDER — ALBUTEROL SULFATE HFA 108 (90 BASE) MCG/ACT IN AERS: INHALATION_SPRAY | RESPIRATORY_TRACT | 2 refills | Status: DC

## 2022-08-12 ENCOUNTER — Telehealth: Payer: Commercial Managed Care - HMO | Admitting: Family

## 2022-08-12 DIAGNOSIS — J4520 Mild intermittent asthma, uncomplicated: Secondary | ICD-10-CM | POA: Diagnosis not present

## 2022-08-12 MED ORDER — PREDNISONE 20 MG PO TABS: 40.0000 mg | ORAL_TABLET | Freq: Every day | ORAL | 0 refills | Status: AC

## 2022-08-12 MED ORDER — ALBUTEROL SULFATE HFA 108 (90 BASE) MCG/ACT IN AERS: 2.0000 | INHALATION_SPRAY | Freq: Four times a day (QID) | RESPIRATORY_TRACT | 0 refills | Status: DC | PRN

## 2022-08-12 NOTE — Progress Notes (Signed)
Visit for Asthma  Based on what you have shared with me, it looks like you may have a flare up of your asthma.  Asthma is a chronic (ongoing) lung disease which results in airway obstruction, inflammation and hyper-responsiveness.   Asthma symptoms vary from person to person, with common symptoms including nighttime awakening and decreased ability to participate in normal activities as a result of shortness of breath. It is often triggered by changes in weather, changes in the season, changes in air temperature, or inside (home, school, daycare or work) allergens such as animal dander, mold, mildew, woodstoves or cockroaches.   It can also be triggered by hormonal changes, extreme emotion, physical exertion or an upper respiratory tract illness.     It is important to identify the trigger, and then eliminate or avoid the trigger if possible.   If you have been prescribed medications to be taken on a regular basis, it is important to follow the asthma action plan and to follow guidelines to adjust medication in response to increasing symptoms of decreased peak expiratory flow rate  Treatment: I have prescribed: Albuterol (Proventil HFA; Ventolin HFA) 108 (90 Base) MCG/ACT Inhaler 2 puffs into the lungs every six hours as needed for wheezing or shortness of breath and Prednisone 40mg by mouth per day for  7 days  HOME CARE . Only take medications as instructed by your medical team. . Consider wearing a mask or scarf to improve breathing air temperature have been shown to decrease irritation and decrease exacerbations . Get rest. . Taking a steamy shower or using a humidifier may help nasal congestion sand ease sore throat pain. You can place a towel over your head and breathe in the steam from hot water coming from a faucet. . Using a saline nasal spray works much the same way.  . Cough  drops, hare candies and sore throat lozenges may ease your cough.  . Avoid close contacts especially the very you and the elderly . Cover your mouth if you cough or sneeze . Always remember to wash your hands.    GET HELP RIGHT AWAY IF: . You develop worsening symptoms; breathlessness at rest, drowsy, confused or agitated, unable to speak in full sentences . You have coughing fits . You develop a severe headache or visual changes . You develop shortness of breath, difficulty breathing or start having chest pain . Your symptoms persist after you have completed your treatment plan . If your symptoms do not improve within 10 days  MAKE SURE YOU . Understand these instructions. . Will watch your condition. . Will get help right away if you are not doing well or get worse.   Your e-visit answers were reviewed by a board certified advanced clinical practitioner to complete your personal care plan, Depending upon the condition, your plan could have included both over the counter or prescription medications.  Please review your pharmacy choice. Your safety is important to us. If you have drug allergies check your prescription carefully. You can use MyChart to ask questions about today's visit, request a non-urgent call back, or ask for a work or school excuse for 24 hours related to this e-Visit. If it has been greater than 24 hours you will need to follow up with your provider, or enter a new e-Visit to address those concerns.  You will get an e-mail in the next two days asking about your experience. I hope that your e-visit has been valuable and will speed your recovery. Thank   you for using e-visits.   Approximately 5 minutes was spent documenting and reviewing patient's chart.   

## 2022-08-15 ENCOUNTER — Ambulatory Visit: Payer: Commercial Managed Care - HMO | Admitting: Podiatrist

## 2022-08-15 ENCOUNTER — Ambulatory Visit (INDEPENDENT_AMBULATORY_CARE_PROVIDER_SITE_OTHER): Payer: Commercial Managed Care - HMO

## 2022-08-15 ENCOUNTER — Ambulatory Visit: Payer: Commercial Managed Care - HMO

## 2022-08-15 ENCOUNTER — Encounter: Payer: Self-pay | Admitting: Podiatrist

## 2022-08-15 DIAGNOSIS — M778 Other enthesopathies, not elsewhere classified: Secondary | ICD-10-CM | POA: Diagnosis not present

## 2022-08-15 DIAGNOSIS — M79672 Pain in left foot: Secondary | ICD-10-CM

## 2022-08-15 DIAGNOSIS — M79671 Pain in right foot: Secondary | ICD-10-CM

## 2022-08-15 DIAGNOSIS — Q66221 Congenital metatarsus adductus, right foot: Secondary | ICD-10-CM | POA: Diagnosis not present

## 2022-08-15 DIAGNOSIS — Q66222 Congenital metatarsus adductus, left foot: Secondary | ICD-10-CM | POA: Diagnosis not present

## 2022-08-15 NOTE — Progress Notes (Signed)
Chief Complaint  Patient presents with   Foot Pain    Bilateral foot pain rt ankle pain,      HPI: Patient is 31 y.o. female who presents today for bilateral foot pain and right ankle pain on the lateral side. She relates she noticed the discomfort about 3 months ago-  she relates pain in the ball of the feet and arches. She has tried soaking and tylenol/ advil for pain.  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 8 g 0   atorvastatin (LIPITOR) 40 MG tablet TAKE 1 TABLET(40 MG) BY MOUTH DAILY 90 tablet 0   benzonatate (TESSALON) 100 MG capsule Take 1 capsule (100 mg total) by mouth 3 (three) times daily as needed for cough. 30 capsule 0   busPIRone (BUSPAR) 5 MG tablet Take 1 tablet (5 mg total) by mouth 2 (two) times daily. (Patient taking differently: Take 10 mg by mouth 2 (two) times daily.) 60 tablet 1   doxycycline (VIBRA-TABS) 100 MG tablet Take 1 tablet (100 mg total) by mouth 2 (two) times daily. 20 tablet 0   fluticasone (FLONASE) 50 MCG/ACT nasal spray Place 2 sprays into both nostrils daily. 16 g 6    fluticasone-salmeterol (ADVAIR HFA) 115-21 MCG/ACT inhaler Inhale 2 puffs into the lungs 2 (two) times daily. 1 each 12   hydrOXYzine (VISTARIL) 50 MG capsule Take 1 capsule (50 mg total) by mouth every 8 (eight) hours as needed. 30 capsule 0   ibuprofen (ADVIL) 600 MG tablet Take 1 tablet (600 mg total) by mouth every 8 (eight) hours as needed. 30 tablet 0   ipratropium-albuterol (DUONEB) 0.5-2.5 (3) MG/3ML SOLN Take 3 mLs by nebulization in the morning, at noon, in the evening, and at bedtime. (Patient taking differently: Take 3 mLs by nebulization every 4 (four) hours as needed (Asthma).) 360 mL 2   metFORMIN (GLUCOPHAGE) 500 MG tablet Take 1 tablet (500 mg total) by mouth 2 (two) times daily with a meal. 60 tablet 5   metoprolol succinate (TOPROL-XL) 25 MG 24 hr tablet TAKE 1 TABLET(25 MG) BY MOUTH DAILY 90 tablet 0   mometasone-formoterol (DULERA) 200-5 MCG/ACT AERO Take 2 puffs first thing in am and then another 2 puffs about 12 hours later. 1 each 11   montelukast (SINGULAIR) 10 MG tablet Take 1 tablet (10 mg total) by mouth at bedtime. 30 tablet 11   neomycin-polymyxin-hydrocortisone (CORTISPORIN) OTIC solution Place 3 drops into both ears 4 (four) times daily. X 7 days 10 mL 0   pantoprazole (PROTONIX) 40 MG tablet TAKE 1 TABLET(40 MG) BY MOUTH DAILY 30 tablet 5  predniSONE (DELTASONE) 20 MG tablet Take 2 tablets (40 mg total) by mouth daily with breakfast for 7 days. 14 tablet 0   promethazine-dextromethorphan (PROMETHAZINE-DM) 6.25-15 MG/5ML syrup Take 5 mLs by mouth 4 (four) times daily as needed for cough. 118 mL 0   sertraline (ZOLOFT) 100 MG tablet Take 1 tablet (100 mg total) by mouth daily. 30 tablet 1   traMADol (ULTRAM) 50 MG tablet Take 2 tablets (100 mg total) by mouth every 6 (six) hours as needed for severe pain. 60 tablet 0   zolpidem (AMBIEN CR) 12.5 MG CR tablet Take 1 tablet (12.5 mg total) by mouth at bedtime as needed for sleep. 30 tablet 0   No current  facility-administered medications on file prior to visit.    Allergies  Allergen Reactions   Bee Venom Anaphylaxis   Ketorolac Tromethamine Palpitations and Shortness Of Breath   Penicillins Nausea And Vomiting and Other (See Comments)    Intolerance     Review of Systems No fevers, chills, nausea, muscle aches, no difficulty breathing, no calf pain, no chest pain or shortness of breath.   Physical Exam  GENERAL APPEARANCE: Alert, conversant. Appropriately groomed. No acute distress.   VASCULAR: Pedal pulses palpable 2/4 DP and  PT bilateral.  Capillary refill time is immediate to all digits,  Proximal to distal cooling it warm to warm.  Digital perfusion adequate.   NEUROLOGIC: sensation is intact to 5.07 monofilament at 5/5 sites bilateral.  Light touch is intact bilateral, vibratory sensation intact bilateral  MUSCULOSKELETAL: acceptable muscle strength, tone and stability bilateral. High arch and Metatarsus adductus foot type noted. Prominent plantar pressure fifth metatarsal noted bilateral. Plantar arch pain is also noted with palpation   DERMATOLOGIC: skin is warm, supple, and dry.  Pressure callus present plantar lateral right foot.    Xrays- metatarsus adductus foot noted bilateral.  Os trigonum noted bilateral. Right ankle joint appears normal. Ankle mortise joint space is normal. No arthritic changes.  No acute osseus abnormalities noted.   Assessment     ICD-10-CM   1. 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 Capsulitis of right foot  M77.8        Plan  Discussed exam and xray findings. Discussed her foot pain is likely a result from her metatarsus adductus foot type.  Suggested she would likley benefit from some custom orthotics.  Recommended soft and accommodative shoegear and to start with otc inserts for her shoes.  She  will try this first and will call if symptoms fail to improve.

## 2022-08-30 ENCOUNTER — Other Ambulatory Visit: Payer: Self-pay | Admitting: Pulmonary Disease

## 2022-08-30 ENCOUNTER — Encounter: Payer: Self-pay | Admitting: Nurse Practitioner

## 2022-08-30 ENCOUNTER — Ambulatory Visit (INDEPENDENT_AMBULATORY_CARE_PROVIDER_SITE_OTHER): Payer: Commercial Managed Care - HMO | Admitting: Nurse Practitioner

## 2022-08-30 ENCOUNTER — Ambulatory Visit (INDEPENDENT_AMBULATORY_CARE_PROVIDER_SITE_OTHER): Payer: Commercial Managed Care - HMO

## 2022-08-30 VITALS — BP 140/80 | HR 126 | Ht 65.0 in | Wt 304.2 lb

## 2022-08-30 DIAGNOSIS — J45901 Unspecified asthma with (acute) exacerbation: Secondary | ICD-10-CM | POA: Diagnosis not present

## 2022-08-30 DIAGNOSIS — J189 Pneumonia, unspecified organism: Secondary | ICD-10-CM

## 2022-08-30 DIAGNOSIS — J4551 Severe persistent asthma with (acute) exacerbation: Secondary | ICD-10-CM | POA: Diagnosis not present

## 2022-08-30 MED ORDER — BREZTRI AEROSPHERE 160-9-4.8 MCG/ACT IN AERO: 2.0000 | INHALATION_SPRAY | Freq: Two times a day (BID) | RESPIRATORY_TRACT | 0 refills | Status: DC

## 2022-08-30 MED ORDER — BUDESONIDE 0.5 MG/2ML IN SUSP: 0.5000 mg | Freq: Two times a day (BID) | RESPIRATORY_TRACT | 5 refills | Status: AC

## 2022-08-30 NOTE — Progress Notes (Signed)
@Patient  ID: , female    DOB: 1991-03-14, 31 y.o.   MRN: [id number]  Chief Complaint  Patient presents with   Follow-up    Asthma, possible pneumonia.    Referring provider: [id number], NP  HPI: 31 year old female, never smoker followed for asthma. She had a history of childhood asthma and maintained on SABA. She started having more respiratory symptoms in 2021 and was hospitalized around Thanksgiving 2021 requiring mechanical ventilation. She is a patient of Dr. 03-23-2002 and last seen in office on 02/27/2022. Past medical history significant for   TEST/EVENTS:  02/12/2022 HST: AHI 7.1/h, SpO2 low 89%. Treated conservatively with weight loss  02/27/2022: OV with Dr. 04/29/2022. Maintained on Dulera and singulair. Still having flares requiring prednisone. Treated with prednisone taper. Referred to allergy given history of elevated IgE and eosinophils.   08/30/2022: Today - follow up Patient presents today for hospital follow up. She was seen in the ED in [place] yesterday due to increased shortness of breath and productive cough with yellow sputum.  She underwent imaging, which is not available for review today.  One physician told her that she had pneumonia and another one said that he did not think that she did.  They discharged her home on Levaquin 750 mg x10 days and prednisone taper, which she started yesterday.  She wanted to come to her lung doctor to make sure that she was on the proper treatment.  Today, she reports feeling slightly better.  Still has a little bit of increased shortness of breath from her baseline and chest tightness.  She has not noticed any increased wheezing.  Still occasionally coughing up some phlegm.  Denies any fevers, chills, night sweats, hemoptysis, PND, orthopnea, lower extremity swelling.  They did respiratory viral testing on her which was all negative.  She has had multiple asthma exacerbations over the past year.  She has required prednisone  at least 5-6 times in the last 6 months.  She was previously referred to allergist given allergic phenotype by Dr. 10/30/2022 but she has yet to follow-up with them because of her insurance changing.  She is currently on [facility] which she uses twice daily.  ACT score was 8.  Allergies  Allergen Reactions   Bee Venom Anaphylaxis   Ketorolac Tromethamine Palpitations and Shortness Of Breath   Penicillins Nausea And Vomiting and Other (See Comments)    Intolerance     Immunization History  Administered Date(s) Administered   Influenza,inj,Quad PF,6+ Mos 11/07/2021   Influenza-Unspecified 06/03/2022   PFIZER(Purple Top)SARS-COV-2 Vaccination 01/27/2020, 02/25/2020    Past Medical History:  Diagnosis Date   ADHD (attention deficit hyperactivity disorder)    Anemia    Anxiety    Asthma    Complication of anesthesia    COVID 01/2022   out of work for a week   Endometriosis    GERD (gastroesophageal reflux disease)    Heart rate fast    takes Metoprolol   History of kidney stones    Pneumonia    PONV (postoperative nausea and vomiting)    Pre-diabetes    Tachycardia     Tobacco History: Social History   Tobacco Use  Smoking Status Never  Smokeless Tobacco Never   Counseling given: Not Answered   Outpatient Medications Prior to Visit  Medication Sig Dispense Refill   albuterol (VENTOLIN HFA) 108 (90 Base) MCG/ACT inhaler Inhale 2 puffs into the lungs every 6 (six) hours as needed for wheezing or  shortness of breath. 8 g 0   fluticasone (FLONASE) 50 MCG/ACT nasal spray Place 2 sprays into both nostrils daily. 16 g 6   ipratropium-albuterol (DUONEB) 0.5-2.5 (3) MG/3ML SOLN Take 3 mLs by nebulization in the morning, at noon, in the evening, and at bedtime. (Patient taking differently: Take 3 mLs by nebulization every 4 (four) hours as needed (Asthma).) 360 mL 2   mometasone-formoterol (DULERA) 200-5 MCG/ACT AERO Take 2 puffs first thing in am and then another 2 puffs about 12 hours  later. 1 each 11   montelukast (SINGULAIR) 10 MG tablet Take 1 tablet (10 mg total) by mouth at bedtime. 30 tablet 11   fluticasone-salmeterol (ADVAIR HFA) 115-21 MCG/ACT inhaler Inhale 2 puffs into the lungs 2 (two) times daily. 1 each 12   atorvastatin (LIPITOR) 40 MG tablet TAKE 1 TABLET(40 MG) BY MOUTH DAILY 90 tablet 0   busPIRone (BUSPAR) 5 MG tablet Take 1 tablet (5 mg total) by mouth 2 (two) times daily. (Patient taking differently: Take 10 mg by mouth 2 (two) times daily.) 60 tablet 1   hydrOXYzine (VISTARIL) 50 MG capsule Take 1 capsule (50 mg total) by mouth every 8 (eight) hours as needed. 30 capsule 0   ibuprofen (ADVIL) 600 MG tablet Take 1 tablet (600 mg total) by mouth every 8 (eight) hours as needed. 30 tablet 0   levofloxacin (LEVAQUIN) 750 MG tablet Take 750 mg by mouth daily.     metFORMIN (GLUCOPHAGE) 500 MG tablet Take 1 tablet (500 mg total) by mouth 2 (two) times daily with a meal. 60 tablet 5   metoprolol succinate (TOPROL-XL) 25 MG 24 hr tablet TAKE 1 TABLET(25 MG) BY MOUTH DAILY 90 tablet 0   pantoprazole (PROTONIX) 40 MG tablet TAKE 1 TABLET(40 MG) BY MOUTH DAILY 30 tablet 5   predniSONE (STERAPRED UNI-PAK 21 TAB) 5 MG (21) TBPK tablet Take by mouth.     sertraline (ZOLOFT) 100 MG tablet Take 1 tablet (100 mg total) by mouth daily. 30 tablet 1   zolpidem (AMBIEN CR) 12.5 MG CR tablet Take 1 tablet (12.5 mg total) by mouth at bedtime as needed for sleep. 30 tablet 0   benzonatate (TESSALON) 100 MG capsule Take 1 capsule (100 mg total) by mouth 3 (three) times daily as needed for cough. 30 capsule 0   doxycycline (VIBRA-TABS) 100 MG tablet Take 1 tablet (100 mg total) by mouth 2 (two) times daily. 20 tablet 0   neomycin-polymyxin-hydrocortisone (CORTISPORIN) OTIC solution Place 3 drops into both ears 4 (four) times daily. X 7 days 10 mL 0   promethazine-dextromethorphan (PROMETHAZINE-DM) 6.25-15 MG/5ML syrup Take 5 mLs by mouth 4 (four) times daily as needed for cough. 118  mL 0   traMADol (ULTRAM) 50 MG tablet Take 2 tablets (100 mg total) by mouth every 6 (six) hours as needed for severe pain. 60 tablet 0   No facility-administered medications prior to visit.     Review of Systems:   Constitutional: No weight loss or gain, night sweats, fevers, chills , or lassitude. +fatigue HEENT: No headaches, difficulty swallowing, tooth/dental problems, or sore throat. No sneezing, itching, ear ache, nasal congestion, or post nasal drip CV:  No chest pain, orthopnea, PND, swelling in lower extremities, anasarca, dizziness, palpitations, syncope Resp: +shortness of breath with exertion; productive cough; chest tightness; occasional wheeze. No hemoptysis.  No chest wall deformity GI:  No heartburn, indigestion, abdominal pain, nausea, vomiting, diarrhea, change in bowel habits, loss of appetite, bloody stools.  MSK:  No joint pain or swelling.  No decreased range of motion.  No back pain. Neuro: No dizziness or lightheadedness.  Psych: No depression or anxiety. Mood stable.     Physical Exam:  BP (!) 140/80 (BP Location: Right Arm)   Pulse (!) 126   Ht 5\' 5"  (1.651 m)   Wt (!) 304 lb 3.2 oz (138 kg)   SpO2 95%   BMI 50.62 kg/m   GEN: Pleasant, interactive, well-appearing; morbid obesity; in no acute distress. HEENT:  Normocephalic and atraumatic. PERRLA. Sclera white. Nasal turbinates pink, moist and patent bilaterally. No rhinorrhea present. Oropharynx erythematous and moist, without exudate or edema. No lesions, ulcerations NECK:  Supple w/ fair ROM. No JVD present. Normal carotid impulses w/o bruits. Thyroid symmetrical with no goiter or nodules palpated. No lymphadenopathy.   CV: RRR, no m/r/g, no peripheral edema. Pulses intact, +2 bilaterally. No cyanosis, pallor or clubbing. PULMONARY:  Unlabored, regular breathing. Expiratory wheezes right lung otherwise left lung clear bilaterally A&P w/o wheezes/rales/rhonchi. No accessory muscle use. No dullness to  percussion. GI: BS present and normoactive. Soft, non-tender to palpation. No organomegaly or masses detected. No CVA tenderness. MSK: No erythema, warmth or tenderness. Cap refil <2 sec all extrem. No deformities or joint swelling noted.  Neuro: A/Ox3. No focal deficits noted.   Skin: Warm, no lesions or rashe Psych: Normal affect and behavior. Judgement and thought content appropriate.     Lab Results:  CBC    Component Value Date/Time   WBC 15.2 (H) 03/07/2022 1331   WBC 11.0 (H) 03/05/2022 0927   RBC 4.93 03/07/2022 1331   RBC 4.90 03/05/2022 0927   HGB 12.2 03/07/2022 1331   HCT 39.3 03/07/2022 1331   PLT 489 (H) 03/07/2022 1331   MCV 80 03/07/2022 1331   MCH 24.7 (L) 03/07/2022 1331   MCH 25.1 (L) 03/05/2022 0927   MCHC 31.0 (L) 03/07/2022 1331   MCHC 31.2 03/05/2022 0927   RDW 15.6 (H) 03/07/2022 1331   LYMPHSABS 1.9 11/13/2021 0949   MONOABS 0.7 11/13/2021 0949   EOSABS 0.6 11/13/2021 0949   BASOSABS 0.1 11/13/2021 0949    BMET    Component Value Date/Time   NA 137 03/05/2022 0927   K 4.1 03/05/2022 0927   CL 105 03/05/2022 0927   CO2 23 03/05/2022 0927   GLUCOSE 90 03/05/2022 0927   BUN 8 03/05/2022 0927   CREATININE 0.80 03/05/2022 0927   CALCIUM 9.1 03/05/2022 0927   GFRNONAA >60 03/05/2022 0927   GFRAA >60 02/27/2018 0716    BNP No results found for: "BNP"   Imaging:  DG Chest 2 View  Result Date: 08/30/2022 CLINICAL DATA:  Follow-up pneumonia. EXAM: CHEST - 2 VIEW COMPARISON:  AP chest 08/29/2022, 05/30/2022; CT chest 08/29/2022 FINDINGS: Cardiac silhouette and mediastinal contours within normal limits. Mildly decreased lung volumes. Associated bronchovascular crowding. No focal airspace opacity is seen. No pleural effusion or pneumothorax. Cholecystectomy clips. Mild-to-moderate multilevel degenerative disc changes of the thoracic spine. IMPRESSION: No radiographic evidence of pneumonia. Note is made that the ground-glass opacities raising the  question of an infectious or inflammatory process within the bilateral lung apices were only seen on CT 08/29/2022, not on same day 08/29/2022 radiographs. Electronically Signed   By: 10/29/2022 M.D.   On: 08/30/2022 16:38          No data to display          No results found for: "NITRICOXIDE"      Assessment &  Plan:   CAP (community acquired pneumonia) She was seen yesterday at the emergency department in [place].  She had conflicting information but was discharged home on Levaquin 750 mg x 10 days for possible pneumonia.  She is not exactly comfortable with this and would like further evaluation.  Unfortunately, I do not have access to any of the imaging today.  Given her symptoms, we will repeat CXR and determine need for ongoing antibiotic therapy.  Recommended that she continue mucolytic therapy and prednisone taper for asthma exacerbation.  Patient Instructions  Continue Albuterol inhaler 2 puffs or 3 mL neb every 6 hours as needed for shortness of breath or wheezing. Notify if symptoms persist despite rescue inhaler/neb use. Continue flonase nasal spray 2 sprays each nostril daily Continue singulair 1 tab At bedtime  Continue pantoprazole 1 tab daily for reflux Complete prednisone taper pack as previously ordered  Stop Dulera. Start Breztri 2 puffs Twice daily. Brush tongue and rinse mouth afterwards.  Budesonide nebs 2 mL Twice daily as needed for shortness of breath or wheezing. Brush tongue and rinse mouth afterwards  Contact Asthma/Allergy as requested by Dr. Sherene Sires  [facility] [address] [place] [address]  [phone]  We will start the process for biologic therapy with Dupixent - someone from pharmacy will contact you to get this scheduled  Chest x ray today - I will notify you if you need to complete levaquin   Follow up in 2 weeks with Dr. Sherene Sires or Katie Lilymae Swiech,NP. If symptoms do not improve or worsen, please contact office  for sooner follow up or seek emergency care.     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  Now with acute exacerbation possibly related to CAP.  She is slowly improving.  Recommended that she continue prednisone taper as previously prescribed.  She has had multiple flares over the last year requiring prednisone.  Given this, recommended we trial stepping her up to triple therapy.  Added on prn budesonide nebs for breakthrough shortness of breath or wheezing.  I believe she would also benefit from biologic therapy.  We will get the process started for Dupixent.  Recommended that she still see allergy for further recommendations now that she has insurance coverage.  Provided her with the phone number today.   I spent 35 minutes of dedicated to the care of this patient on the date of this encounter to include pre-visit review of records, face-to-face time with the patient discussing conditions above, post visit ordering of testing, clinical documentation with the electronic health record, making appropriate referrals as documented, and communicating necessary findings to members of the patients care team.  Noemi Chapel, NP 08/30/2022  Pt aware and understands NP's role.

## 2022-08-30 NOTE — Assessment & Plan Note (Signed)
She was seen yesterday at the emergency department in [place].  She had conflicting information but was discharged home on Levaquin 750 mg x 10 days for possible pneumonia.  She is not exactly comfortable with this and would like further evaluation.  Unfortunately, I do not have access to any of the imaging today.  Given her symptoms, we will repeat CXR and determine need for ongoing antibiotic therapy.  Recommended that she continue mucolytic therapy and prednisone taper for asthma exacerbation.  Patient Instructions  Continue Albuterol inhaler 2 puffs or 3 mL neb every 6 hours as needed for shortness of breath or wheezing. Notify if symptoms persist despite rescue inhaler/neb use. Continue flonase nasal spray 2 sprays each nostril daily Continue singulair 1 tab At bedtime  Continue pantoprazole 1 tab daily for reflux Complete prednisone taper pack as previously ordered  Stop Dulera. Start Breztri 2 puffs Twice daily. Brush tongue and rinse mouth afterwards.  Budesonide nebs 2 mL Twice daily as needed for shortness of breath or wheezing. Brush tongue and rinse mouth afterwards  Contact Asthma/Allergy as requested by Dr. Sherene Sires  [facility] [address] [place] [address]  [phone]  We will start the process for biologic therapy with Dupixent - someone from pharmacy will contact you to get this scheduled  Chest x ray today - I will notify you if you need to complete levaquin   Follow up in 2 weeks with Dr. Sherene Sires or Katie Myrene Bougher,NP. If symptoms do not improve or worsen, please contact office for sooner follow up or seek emergency care.

## 2022-08-30 NOTE — Assessment & Plan Note (Signed)
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  Now with acute exacerbation possibly related to CAP.  She is slowly improving.  Recommended that she continue prednisone taper as previously prescribed.  She has had multiple flares over the last year requiring prednisone.  Given this, recommended we trial stepping her up to triple therapy.  Added on prn budesonide nebs for breakthrough shortness of breath or wheezing.  I believe she would also benefit from biologic therapy.  We will get the process started for Dupixent.  Recommended that she still see allergy for further recommendations now that she has insurance coverage.  Provided her with the phone number today.

## 2022-08-30 NOTE — Patient Instructions (Addendum)
Continue Albuterol inhaler 2 puffs or 3 mL neb every 6 hours as needed for shortness of breath or wheezing. Notify if symptoms persist despite rescue inhaler/neb use. Continue flonase nasal spray 2 sprays each nostril daily Continue singulair 1 tab At bedtime  Continue pantoprazole 1 tab daily for reflux Complete prednisone taper pack as previously ordered  Stop Dulera. Start Breztri 2 puffs Twice daily. Brush tongue and rinse mouth afterwards.  Budesonide nebs 2 mL Twice daily as needed for shortness of breath or wheezing. Brush tongue and rinse mouth afterwards  Contact Asthma/Allergy as requested by Dr. Sherene Sires  [facility]  [address] [place] [address]  [phone]  We will start the process for biologic therapy with Dupixent - someone from pharmacy will contact you to get this scheduled  Chest x ray today - I will notify you if you need to complete levaquin   Follow up in 2 weeks with Dr. Sherene Sires or Katie Deke Tilghman,NP. If symptoms do not improve or worsen, please contact office for sooner follow up or seek emergency care.

## 2022-08-30 NOTE — Progress Notes (Signed)
Notified patient of CXR findings. Radiologist was able to review previous imaging. Seems that pneumonia was diagnosed based off CT chest findings. Given her infectious symptoms and ground glass opacities on CT, recommended that she continue levaquin but only 5 day course. Verbalized understanding. Follow  up 2 weeks

## 2022-09-01 MED ORDER — ZOLPIDEM TARTRATE ER 12.5 MG PO TBCR: 12.5000 mg | EXTENDED_RELEASE_TABLET | Freq: Every evening | ORAL | 0 refills | Status: DC | PRN

## 2022-09-03 ENCOUNTER — Other Ambulatory Visit (HOSPITAL_COMMUNITY): Payer: Self-pay

## 2022-09-04 ENCOUNTER — Encounter: Payer: Self-pay | Admitting: Obstetrics & Gynecology

## 2022-09-06 ENCOUNTER — Other Ambulatory Visit (HOSPITAL_COMMUNITY): Payer: Self-pay

## 2022-09-06 NOTE — Telephone Encounter (Signed)
Pharmacy, are you able to see if Budesonide is cheaper through a home care company? Thanks for your help!

## 2022-09-07 NOTE — Telephone Encounter (Signed)
[facility], is there a specific DME pharmacy that I should send her Budesonide to based off her insurance? Thanks.

## 2022-09-07 NOTE — Telephone Encounter (Signed)
Ok to send to DME. Budesonide 2 mL Twice daily

## 2022-09-07 NOTE — Telephone Encounter (Signed)
Tina Manning, please advise if ok to try and send Budesonide script through a DME pharmacy. Pt states she currently cannot afford it at $80/month [name]).

## 2022-09-07 NOTE — Telephone Encounter (Signed)
Meds can only be sent thru DME if pt's insurance is Medicare or a Medicare HMO plan.  Pt has regular commercial insurance so this is not an option.

## 2022-09-10 ENCOUNTER — Ambulatory Visit (INDEPENDENT_AMBULATORY_CARE_PROVIDER_SITE_OTHER): Payer: Commercial Managed Care - HMO | Admitting: Physician Assistant

## 2022-09-10 ENCOUNTER — Ambulatory Visit: Payer: Commercial Managed Care - HMO | Admitting: Nurse Practitioner

## 2022-09-10 ENCOUNTER — Encounter: Payer: Self-pay | Admitting: Physician Assistant

## 2022-09-10 ENCOUNTER — Ambulatory Visit (INDEPENDENT_AMBULATORY_CARE_PROVIDER_SITE_OTHER): Payer: Commercial Managed Care - HMO

## 2022-09-10 VITALS — Ht 65.0 in | Wt 310.0 lb

## 2022-09-10 DIAGNOSIS — M25531 Pain in right wrist: Secondary | ICD-10-CM | POA: Diagnosis not present

## 2022-09-10 DIAGNOSIS — G5601 Carpal tunnel syndrome, right upper limb: Secondary | ICD-10-CM | POA: Diagnosis not present

## 2022-09-10 NOTE — Telephone Encounter (Signed)
No, budesonide is our only inhaled steroid nebulizer option. This is just for rescue though to be use for increased SOB/wheezing and should not be something she's using daily.   I see where she reported she was having worsening symptoms; I'm not sure if this has been addressed. Can you schedule her an acute visit if not? Thanks.

## 2022-09-10 NOTE — Progress Notes (Deleted)
@Patient  ID: , female    DOB: [birth year], 31 y.o.   MRN: [id number]  No chief complaint on file.   Referring provider: [id number], NP  HPI: 31 year old female, never smoker followed for asthma. She had a history of childhood asthma and maintained on SABA. She started having more respiratory symptoms in 2021 and was hospitalized around Thanksgiving 2021 requiring mechanical ventilation. She is a patient of Dr. 03-23-2002 and last seen in office on 02/27/2022. Past medical history significant for   TEST/EVENTS:  02/12/2022 HST: AHI 7.1/h, SpO2 low 89%. Treated conservatively with weight loss  02/27/2022: OV with Dr. 04/29/2022. Maintained on Dulera and singulair. Still having flares requiring prednisone. Treated with prednisone taper. Referred to allergy given history of elevated IgE and eosinophils.   08/30/2022: Today - follow up Patient presents today for hospital follow up. She was seen in the ED in [place] yesterday due to increased shortness of breath and productive cough with yellow sputum.  She underwent imaging, which is not available for review today.  One physician told her that she had pneumonia and another one said that he did not think that she did.  They discharged her home on Levaquin 750 mg x10 days and prednisone taper, which she started yesterday.  She wanted to come to her lung doctor to make sure that she was on the proper treatment.  Today, she reports feeling slightly better.  Still has a little bit of increased shortness of breath from her baseline and chest tightness.  She has not noticed any increased wheezing.  Still occasionally coughing up some phlegm.  Denies any fevers, chills, night sweats, hemoptysis, PND, orthopnea, lower extremity swelling.  They did respiratory viral testing on her which was all negative.  She has had multiple asthma exacerbations over the past year.  She has required prednisone at least 5-6 times in the last 6 months.  She was  previously referred to allergist given allergic phenotype by Dr. 10/30/2022 but she has yet to follow-up with them because of her insurance changing.  She is currently on [facility] which she uses twice daily.  ACT score was 8.  Allergies  Allergen Reactions   Bee Venom Anaphylaxis   Ketorolac Tromethamine Palpitations and Shortness Of Breath   Penicillins Nausea And Vomiting and Other (See Comments)    Intolerance     Immunization History  Administered Date(s) Administered   Influenza,inj,Quad PF,6+ Mos 11/07/2021   Influenza-Unspecified 06/03/2022   PFIZER(Purple Top)SARS-COV-2 Vaccination 01/27/2020, 02/25/2020    Past Medical History:  Diagnosis Date   ADHD (attention deficit hyperactivity disorder)    Anemia    Anxiety    Asthma    Complication of anesthesia    COVID 01/2022   out of work for a week   Endometriosis    GERD (gastroesophageal reflux disease)    Heart rate fast    takes Metoprolol   History of kidney stones    Pneumonia    PONV (postoperative nausea and vomiting)    Pre-diabetes    Tachycardia     Tobacco History: Social History   Tobacco Use  Smoking Status Never  Smokeless Tobacco Never   Counseling given: Not Answered   Outpatient Medications Prior to Visit  Medication Sig Dispense Refill   albuterol (VENTOLIN HFA) 108 (90 Base) MCG/ACT inhaler Inhale 2 puffs into the lungs every 6 (six) hours as needed for wheezing or shortness of breath. 8 g 0   atorvastatin (LIPITOR) 40  MG tablet TAKE 1 TABLET(40 MG) BY MOUTH DAILY 90 tablet 0   Budeson-Glycopyrrol-Formoterol (BREZTRI AEROSPHERE) 160-9-4.8 MCG/ACT AERO Inhale 2 puffs into the lungs in the morning and at bedtime. 1 each 0   budesonide (PULMICORT) 0.5 MG/2ML nebulizer solution Take 2 mLs (0.5 mg total) by nebulization in the morning and at bedtime. 60 mL 5   busPIRone (BUSPAR) 5 MG tablet Take 1 tablet (5 mg total) by mouth 2 (two) times daily. (Patient taking differently: Take 10 mg by mouth 2 (two)  times daily.) 60 tablet 1   fluticasone (FLONASE) 50 MCG/ACT nasal spray Place 2 sprays into both nostrils daily. 16 g 6   hydrOXYzine (VISTARIL) 50 MG capsule Take 1 capsule (50 mg total) by mouth every 8 (eight) hours as needed. 30 capsule 0   ibuprofen (ADVIL) 600 MG tablet Take 1 tablet (600 mg total) by mouth every 8 (eight) hours as needed. 30 tablet 0   ipratropium-albuterol (DUONEB) 0.5-2.5 (3) MG/3ML SOLN Take 3 mLs by nebulization in the morning, at noon, in the evening, and at bedtime. (Patient taking differently: Take 3 mLs by nebulization every 4 (four) hours as needed (Asthma).) 360 mL 2   levofloxacin (LEVAQUIN) 750 MG tablet Take 750 mg by mouth daily.     metFORMIN (GLUCOPHAGE) 500 MG tablet Take 1 tablet (500 mg total) by mouth 2 (two) times daily with a meal. 60 tablet 5   metoprolol succinate (TOPROL-XL) 25 MG 24 hr tablet TAKE 1 TABLET(25 MG) BY MOUTH DAILY 90 tablet 0   mometasone-formoterol (DULERA) 200-5 MCG/ACT AERO Take 2 puffs first thing in am and then another 2 puffs about 12 hours later. 1 each 11   montelukast (SINGULAIR) 10 MG tablet Take 1 tablet (10 mg total) by mouth at bedtime. 30 tablet 11   pantoprazole (PROTONIX) 40 MG tablet TAKE 1 TABLET(40 MG) BY MOUTH DAILY 30 tablet 5   predniSONE (STERAPRED UNI-PAK 21 TAB) 5 MG (21) TBPK tablet Take by mouth.     sertraline (ZOLOFT) 100 MG tablet Take 1 tablet (100 mg total) by mouth daily. 30 tablet 1   zolpidem (AMBIEN CR) 12.5 MG CR tablet Take 1 tablet (12.5 mg total) by mouth at bedtime as needed for sleep. 30 tablet 0   No facility-administered medications prior to visit.     Review of Systems:   Constitutional: No weight loss or gain, night sweats, fevers, chills , or lassitude. +fatigue HEENT: No headaches, difficulty swallowing, tooth/dental problems, or sore throat. No sneezing, itching, ear ache, nasal congestion, or post nasal drip CV:  No chest pain, orthopnea, PND, swelling in lower extremities,  anasarca, dizziness, palpitations, syncope Resp: +shortness of breath with exertion; productive cough; chest tightness; occasional wheeze. No hemoptysis.  No chest wall deformity GI:  No heartburn, indigestion, abdominal pain, nausea, vomiting, diarrhea, change in bowel habits, loss of appetite, bloody stools.  MSK:  No joint pain or swelling.  No decreased range of motion.  No back pain. Neuro: No dizziness or lightheadedness.  Psych: No depression or anxiety. Mood stable.     Physical Exam:  There were no vitals taken for this visit.  GEN: Pleasant, interactive, well-appearing; morbid obesity; in no acute distress. HEENT:  Normocephalic and atraumatic. PERRLA. Sclera white. Nasal turbinates pink, moist and patent bilaterally. No rhinorrhea present. Oropharynx erythematous and moist, without exudate or edema. No lesions, ulcerations NECK:  Supple w/ fair ROM. No JVD present. Normal carotid impulses w/o bruits. Thyroid symmetrical with no goiter or  nodules palpated. No lymphadenopathy.   CV: RRR, no m/r/g, no peripheral edema. Pulses intact, +2 bilaterally. No cyanosis, pallor or clubbing. PULMONARY:  Unlabored, regular breathing. Expiratory wheezes right lung otherwise left lung clear bilaterally A&P w/o wheezes/rales/rhonchi. No accessory muscle use. No dullness to percussion. GI: BS present and normoactive. Soft, non-tender to palpation. No organomegaly or masses detected. No CVA tenderness. MSK: No erythema, warmth or tenderness. Cap refil <2 sec all extrem. No deformities or joint swelling noted.  Neuro: A/Ox3. No focal deficits noted.   Skin: Warm, no lesions or rashe Psych: Normal affect and behavior. Judgement and thought content appropriate.     Lab Results:  CBC    Component Value Date/Time   WBC 15.2 (H) 03/07/2022 1331   WBC 11.0 (H) 03/05/2022 0927   RBC 4.93 03/07/2022 1331   RBC 4.90 03/05/2022 0927   HGB 12.2 03/07/2022 1331   HCT 39.3 03/07/2022 1331   PLT 489  (H) 03/07/2022 1331   MCV 80 03/07/2022 1331   MCH 24.7 (L) 03/07/2022 1331   MCH 25.1 (L) 03/05/2022 0927   MCHC 31.0 (L) 03/07/2022 1331   MCHC 31.2 03/05/2022 0927   RDW 15.6 (H) 03/07/2022 1331   LYMPHSABS 1.9 11/13/2021 0949   MONOABS 0.7 11/13/2021 0949   EOSABS 0.6 11/13/2021 0949   BASOSABS 0.1 11/13/2021 0949    BMET    Component Value Date/Time   NA 137 03/05/2022 0927   K 4.1 03/05/2022 0927   CL 105 03/05/2022 0927   CO2 23 03/05/2022 0927   GLUCOSE 90 03/05/2022 0927   BUN 8 03/05/2022 0927   CREATININE 0.80 03/05/2022 0927   CALCIUM 9.1 03/05/2022 0927   GFRNONAA >60 03/05/2022 0927   GFRAA >60 02/27/2018 0716    BNP No results found for: "BNP"   Imaging:  XR Wrist Complete Right  Result Date: 09/10/2022 Radiograph of the right wrist was obtained in multiple projections today.  Patient has well-maintained alignment.  She is status post open reduction internal fixation of a distal radius fracture.  Fracture appears healed hardware is in place and intact without any evidence of loosening.  DG Chest 2 View  Result Date: 08/30/2022 CLINICAL DATA:  Follow-up pneumonia. EXAM: CHEST - 2 VIEW COMPARISON:  AP chest 08/29/2022, 05/30/2022; CT chest 08/29/2022 FINDINGS: Cardiac silhouette and mediastinal contours within normal limits. Mildly decreased lung volumes. Associated bronchovascular crowding. No focal airspace opacity is seen. No pleural effusion or pneumothorax. Cholecystectomy clips. Mild-to-moderate multilevel degenerative disc changes of the thoracic spine. IMPRESSION: No radiographic evidence of pneumonia. Note is made that the ground-glass opacities raising the question of an infectious or inflammatory process within the bilateral lung apices were only seen on CT 08/29/2022, not on same day 08/29/2022 radiographs. Electronically Signed   By: Neita Garnet M.D.   On: 08/30/2022 16:38          No data to display          No results found for:  "NITRICOXIDE"      Assessment & Plan:   No problem-specific Assessment & Plan notes found for this encounter.    I spent 35 minutes of dedicated to the care of this patient on the date of this encounter to include pre-visit review of records, face-to-face time with the patient discussing conditions above, post visit ordering of testing, clinical documentation with the electronic health record, making appropriate referrals as documented, and communicating necessary findings to members of the patients care team.  Ruby Cola  Keaisha Sublette, NP 09/10/2022  Pt aware and understands NP's role.

## 2022-09-10 NOTE — Progress Notes (Signed)
Office Visit Note   Patient: Tina Manning           Date of Birth: [birth year]           MRN: [id number] Visit Date: 09/10/2022              Requested by: Rema Fendt, NP [address] [place],  [place] [address] PCP: Rema Fendt, NP  Chief Complaint  Patient presents with   Right Wrist - Pain      HPI: Tina Manning is a pleasant 31 year old woman who is 16 months status post open reduction internal fixation of her right distal radius fracture by Dr. August Saucer.  She was doing quite well after surgery.  Over the last 3 to 4 months she has developed progressive pain in her right hand and paresthesias in the thumb index and long finger.  She feels her grip strength has decreased.  She also tends to drop things.  She has pain that awakens her at night.  Has a history of being prediabetic.  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  She is wondering if this is secondary to her hardware.  Assessment & Plan: Visit Diagnoses:  1. Pain in right wrist   2. Carpal tunnel syndrome, right upper limb     Plan: Her hardware is in good position.  I do not see any evidence of loosening.  Examination brings concerns for carpal tunnel syndrome.  I discussed this with her.  We will give her a wrist splint for comfort today.  Also talked about anti-inflammatories and topical Voltaren gel.  She will get electrodiagnostic studies of the right upper extremity and then follow-up with Dr. August Saucer.  I explained to her if she did have findings consistent with carpal tunnel syndrome they would consider surgical release.  Her fracture is healed quite well.  Follow-Up Instructions: With Dr. August Saucer after electrodiagnostic studies  Ortho Exam  Patient is alert, oriented, no adenopathy, well-dressed, normal affect, normal respiratory effort. Right wrist and hand.  She has a palpable radial pulse brisk capillary refill.  On the volar surface she has a well-healed surgical incision.   She is able to weakly oppose all of her fingers.  She has a positive Tinel's finding with reproduction of tingling going up into her thumb.  She traces the numbness in her thumb index and sometimes long finger.  Nothing present in the ring finger of the pinky.  Grip strength is slightly decreased.    Imaging: XR Wrist Complete Right  Result Date: 09/10/2022 Radiograph of the right wrist was obtained in multiple projections today.  Patient has well-maintained alignment.  She is status post open reduction internal fixation of a distal radius fracture.  Fracture appears healed hardware is in place and intact without any evidence of loosening.  No images are attached to the encounter.  Labs: Lab Results  Component Value Date   HGBA1C 6.0 (H) 12/14/2021   HGBA1C 5.9 (H) 08/14/2021   LABURIC 1.9 (L) 03/20/2022   REPTSTATUS 03/31/2022 FINAL 03/28/2022   CULT  03/28/2022    NO GROUP A STREP (S.PYOGENES) ISOLATED Performed at [facility], [address], [place], [place] [address]      Lab Results  Component Value Date   ALBUMIN 4.5 08/14/2021    No results found for: "MG" No results found for: "VD25OH"  No results found for: "PREALBUMIN"    Latest Ref Rng & Units 03/07/2022  1:31 PM 03/05/2022    9:27 AM 12/14/2021   11:06 AM  CBC EXTENDED  WBC 3.4 - 10.8 x10E3/uL 15.2  11.0  10.1   RBC 3.77 - 5.28 x10E6/uL 4.93  4.90  4.80   Hemoglobin 11.1 - 15.9 g/dL 03.5  00.9  38.1   HCT 34.0 - 46.6 % 39.3  39.4  38.8   Platelets 150 - 450 x10E3/uL 489  431  454      Body mass index is 51.59 kg/m.  Orders:  Orders Placed This Encounter  Procedures   XR Wrist Complete Right   Ambulatory referral to Physical Medicine Rehab   No orders of the defined types were placed in this encounter.    Procedures: No procedures performed  Clinical Data: No additional findings.  ROS:  All other systems negative, except as noted in the HPI. Review of Systems  Objective: Vital  Signs: Ht 5\' 5"  (1.651 m)   Wt (!) 310 lb (140.6 kg)   BMI 51.59 kg/m   Specialty Comments:  No specialty comments available.  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   Hyperlipidemia 08/15/2021   Prediabetes 08/15/2021   Morbid obesity due to excess calories (HCC) 08/15/2021   Closed fracture of lower end of right radius with routine healing    Anxiety and depression 08/09/2011   Endometriosis 03/27/2009   Tachycardia 11/27/1998   Severe asthma with acute exacerbation 04/24/1994   Past Medical History:  Diagnosis Date   ADHD (attention deficit hyperactivity disorder)    Anemia    Anxiety    Asthma    Complication of anesthesia    COVID 01/2022   out of work for a week   Endometriosis    GERD (gastroesophageal reflux disease)    Heart rate fast    takes Metoprolol   History of kidney stones    Pneumonia    PONV (postoperative nausea and vomiting)    Pre-diabetes    Tachycardia     Family History  Problem Relation Age of Onset   Heart attack Mother    Hypertension Mother    Breast cancer Mother    Polycystic ovary syndrome Mother    Heart disease Father    Hypertension Father    Liver cancer Father     Past Surgical History:  Procedure Laterality Date   CHOLECYSTECTOMY     DILITATION & CURRETTAGE/HYSTROSCOPY WITH NOVASURE ABLATION N/A 03/05/2022   Procedure: DILATATION & CURETTAGE/HYSTEROSCOPY WITH NOVASURE ABLATION;  Surgeon: 03/07/2022, MD;  Location: [facility] OR;  Service: Gynecology;  Laterality: N/A;   OPEN REDUCTION  INTERNAL FIXATION (ORIF) DISTAL RADIAL FRACTURE Right 05/15/2021   Procedure: OPEN REDUCTION INTERNAL FIXATION (ORIF)RIGHT  DISTAL RADIAL FRACTURE;  Surgeon: 05/17/2021, MD;  [facility];  Service: Orthopedics;  Laterality: Right;   TYMPANOSTOMY TUBE PLACEMENT     as a baby   Social History   Occupational History   Occupation: Firehouse [employer]  Tobacco Use   Smoking status: Never   Smokeless tobacco: Never  Vaping Use   Vaping Use: Never used  Substance and Sexual Activity   Alcohol use: No   Drug use: No   Sexual activity: Not Currently    Birth control/protection: None

## 2022-09-12 ENCOUNTER — Telehealth: Payer: Self-pay | Admitting: Physical Medicine and Rehabilitation

## 2022-09-12 NOTE — Telephone Encounter (Signed)
Tried calling to schedule. No answer. Unable to LM VM is full

## 2022-09-12 NOTE — Telephone Encounter (Signed)
Patient returned call asked for a call back to schedule a NCS with Dr. Ernestina Patches.   The number to contact patient is [phone]

## 2022-09-18 ENCOUNTER — Ambulatory Visit: Payer: Commercial Managed Care - HMO | Admitting: Surgical

## 2022-09-18 ENCOUNTER — Telehealth: Payer: Commercial Managed Care - HMO | Admitting: Physician Assistant

## 2022-09-18 ENCOUNTER — Encounter: Payer: Commercial Managed Care - HMO | Admitting: Physical Medicine and Rehabilitation

## 2022-09-18 DIAGNOSIS — J039 Acute tonsillitis, unspecified: Secondary | ICD-10-CM

## 2022-09-19 ENCOUNTER — Other Ambulatory Visit: Payer: Self-pay | Admitting: Podiatry

## 2022-09-19 DIAGNOSIS — Q66221 Congenital metatarsus adductus, right foot: Secondary | ICD-10-CM

## 2022-09-19 MED ORDER — AZITHROMYCIN 250 MG PO TABS: ORAL_TABLET | ORAL | 0 refills | Status: AC

## 2022-09-19 NOTE — Progress Notes (Signed)

## 2022-09-19 NOTE — Progress Notes (Signed)
I have spent 5 minutes in review of e-visit questionnaire, review and updating patient chart, medical decision making and response to patient.   Elienai Gailey Cody Briauna Gilmartin, PA-C    

## 2022-10-01 ENCOUNTER — Other Ambulatory Visit: Payer: Self-pay | Admitting: Pulmonary Disease

## 2022-10-02 ENCOUNTER — Other Ambulatory Visit: Payer: Self-pay | Admitting: Pulmonary Disease

## 2022-10-02 NOTE — Telephone Encounter (Signed)
Called and spoke to pharmacy tech at [facility] and she stated they did not receive the electronic script that was sent in today by Dr. Ander Slade. Phoned in the script that AO intended to be sent in. Will inform pt with email.

## 2022-10-04 ENCOUNTER — Telehealth: Payer: Commercial Managed Care - HMO | Admitting: Family Medicine

## 2022-10-04 ENCOUNTER — Telehealth: Payer: Commercial Managed Care - HMO | Admitting: Physician Assistant

## 2022-10-04 DIAGNOSIS — J020 Streptococcal pharyngitis: Secondary | ICD-10-CM

## 2022-10-04 DIAGNOSIS — J029 Acute pharyngitis, unspecified: Secondary | ICD-10-CM

## 2022-10-04 NOTE — Progress Notes (Signed)
Because you were recently treated for strep and having recurrent symptoms, I feel your condition warrants further evaluation and I recommend that you be seen in a face to face visit for further evaluation and possible testing/culture of the throat.   NOTE: There will be NO CHARGE for this eVisit   If you are having a true medical emergency please call 911.      For an urgent face to face visit, [facility] has seven urgent care centers for your convenience:     [facility] [place] [facility] [place] Get Driving Directions [phone] [place], [place] [address]    [facility] [place] [facility]) Get Driving Directions [phone] [place], [place] [address]  [facility] [place] ([place]) Get Driving Directions [phone] [address] [place],  [place]  [address]  [place] [facility] [place] [facility] - at [address] Get Driving Directions  [phone] [address] [place],  [place] [address]   [facility] Get Driving Directions [phone] [place], [place], [place] [address]   [facility] Get Driving Directions  [phone] [address] [place], [place] [address]   [facility] [place] Get Driving Directions [phone] [address] [place], [place] [address]  Your MyChart E-visit questionnaire answers were reviewed by a board certified advanced clinical practitioner to complete your personal care plan based on your specific symptoms.  Thank you for using e-Visits.   I have spent 5 minutes in review of e-visit questionnaire, review and updating patient chart, medical decision making and response to patient.   Mar Daring, PA-C

## 2022-10-04 NOTE — Progress Notes (Signed)
[place]   Needs in person care given recent treatment for strep and having recurrent symptoms.  Possible swab with testing needed.   Patient acknowledged agreement and understanding of the plan.

## 2022-10-05 ENCOUNTER — Ambulatory Visit: Payer: Self-pay | Admitting: *Deleted

## 2022-10-05 ENCOUNTER — Ambulatory Visit: Payer: Commercial Managed Care - HMO

## 2022-10-05 NOTE — Telephone Encounter (Signed)
  Chief Complaint: sore throat Symptoms: sore throat-red raw, congestion, hoarseness  Frequency: 2 days Pertinent Negatives: Patient denies fever Disposition: [] ED /[x] Urgent Care (no appt availability in office) / [] Appointment(In office/virtual)/ []  [facility]/ [] Home Care/ [] Refused Recommended Disposition /[]  Mobile Bus/ []  Follow-up with PCP Additional Notes:  Patient advised comfort measures and be seen at [facility] as soon as possible.  Reason for Disposition  SEVERE (e.g., excruciating) throat pain  Answer Assessment - Initial Assessment Questions 1. ONSET: "When did the throat start hurting?" (Hours or days ago)      2-3 days 2. SEVERITY: "How bad is the sore throat?" (Scale 1-10; mild, moderate or severe)   - MILD (1-3):  Doesn't interfere with eating or normal activities.   - MODERATE (4-7): Interferes with eating some solids and normal activities.   - SEVERE (8-10):  Excruciating pain, interferes with most normal activities.   - SEVERE WITH DYSPHAGIA (10): Can't swallow liquids, drooling.     moderate 3. STREP EXPOSURE: "Has there been any exposure to strep within the past week?" If Yes, ask: "What type of contact occurred?"      Unknown- works with public 4.  VIRAL SYMPTOMS: "Are there any symptoms of a cold, such as a runny nose, cough, hoarse voice or red eyes?"      Hoarseness, congestion in back of throat 5. FEVER: "Do you have a fever?" If Yes, ask: "What is your temperature, how was it measured, and when did it start?"     No 6. PUS ON THE TONSILS: "Is there pus on the tonsils in the back of your throat?"     no white patch- red 7. OTHER SYMPTOMS: "Do you have any other symptoms?" (e.g., difficulty breathing, headache, rash)     nausea 8. PREGNANCY: "Is there any chance you are pregnant?" "When was your last menstrual period?"     *No Answer*  Protocols used: Sore Throat-A-AH

## 2022-10-11 ENCOUNTER — Encounter: Payer: Self-pay | Admitting: Family Medicine

## 2022-10-18 ENCOUNTER — Telehealth: Payer: Commercial Managed Care - HMO | Admitting: Physician Assistant

## 2022-10-18 DIAGNOSIS — L2082 Flexural eczema: Secondary | ICD-10-CM | POA: Diagnosis not present

## 2022-10-18 MED ORDER — TRIAMCINOLONE ACETONIDE 0.1 % EX CREA: 1.0000 | TOPICAL_CREAM | Freq: Two times a day (BID) | CUTANEOUS | 0 refills | Status: DC

## 2022-10-18 NOTE — Progress Notes (Signed)

## 2022-10-29 ENCOUNTER — Encounter: Payer: Self-pay | Admitting: Internal Medicine

## 2022-10-29 NOTE — Telephone Encounter (Signed)
Ok to refill under my name this once if Dr Ander Slade not Corliss Blacker

## 2022-10-30 ENCOUNTER — Other Ambulatory Visit: Payer: Self-pay | Admitting: Pulmonary Disease

## 2022-10-30 MED ORDER — ZOLPIDEM TARTRATE ER 12.5 MG PO TBCR: 12.5000 mg | EXTENDED_RELEASE_TABLET | Freq: Every day | ORAL | 1 refills | Status: DC

## 2022-10-30 NOTE — Telephone Encounter (Signed)
Ambien refilled

## 2022-11-11 ENCOUNTER — Telehealth: Payer: Commercial Managed Care - HMO | Admitting: Family

## 2022-11-11 DIAGNOSIS — I10 Essential (primary) hypertension: Secondary | ICD-10-CM

## 2022-11-11 DIAGNOSIS — R000 Tachycardia, unspecified: Secondary | ICD-10-CM

## 2022-11-11 MED ORDER — METOPROLOL SUCCINATE ER 25 MG PO TB24: ORAL_TABLET | ORAL | 0 refills | Status: DC

## 2022-11-11 NOTE — Progress Notes (Signed)
E-Visits are not used to request refills.  After reviewing your records, I can verify that you may be running out of a long term medication before your next scheduled appointment.  Based on this information, I can refill your metoprolol 25 mg on a one time basis.    Please contact your doctor as soon as possible to manage your prescription.   Approximately 5 minutes was spent documenting and reviewing patient's chart.

## 2022-11-13 ENCOUNTER — Telehealth: Payer: Commercial Managed Care - HMO | Admitting: Physician Assistant

## 2022-11-13 DIAGNOSIS — M545 Low back pain, unspecified: Secondary | ICD-10-CM | POA: Diagnosis not present

## 2022-11-13 MED ORDER — CYCLOBENZAPRINE HCL 10 MG PO TABS: 10.0000 mg | ORAL_TABLET | Freq: Three times a day (TID) | ORAL | 0 refills | Status: DC | PRN

## 2022-11-13 NOTE — Progress Notes (Signed)

## 2022-11-13 NOTE — Progress Notes (Signed)
I have spent 5 minutes in review of e-visit questionnaire, review and updating patient chart, medical decision making and response to patient.   Mishell Donalson Cody Zi Sek, PA-C    

## 2022-11-14 ENCOUNTER — Ambulatory Visit (INDEPENDENT_AMBULATORY_CARE_PROVIDER_SITE_OTHER): Payer: Commercial Managed Care - HMO | Admitting: Physician Assistant

## 2022-11-14 ENCOUNTER — Encounter: Payer: Self-pay | Admitting: Physician Assistant

## 2022-11-14 ENCOUNTER — Institutional Professional Consult (permissible substitution): Payer: Commercial Managed Care - HMO | Admitting: Obstetrics & Gynecology

## 2022-11-14 ENCOUNTER — Ambulatory Visit (INDEPENDENT_AMBULATORY_CARE_PROVIDER_SITE_OTHER): Payer: Commercial Managed Care - HMO

## 2022-11-14 VITALS — BP 139/84 | HR 112 | Temp 98.1°F | Resp 18 | Wt 310.0 lb

## 2022-11-14 DIAGNOSIS — R3911 Hesitancy of micturition: Secondary | ICD-10-CM

## 2022-11-14 DIAGNOSIS — Z87442 Personal history of urinary calculi: Secondary | ICD-10-CM

## 2022-11-14 DIAGNOSIS — M5442 Lumbago with sciatica, left side: Secondary | ICD-10-CM | POA: Diagnosis not present

## 2022-11-14 LAB — POCT URINALYSIS DIP (CLINITEK)
Bilirubin, UA: NEGATIVE
Blood, UA: NEGATIVE
Glucose, UA: NEGATIVE mg/dL
Ketones, POC UA: NEGATIVE mg/dL
Leukocytes, UA: NEGATIVE
Nitrite, UA: NEGATIVE
POC PROTEIN,UA: 30 — AB
Spec Grav, UA: >=1.03 — AB (ref 1.010–1.025)
Urobilinogen, UA: 0.2 E.U./dL
pH, UA: 5.5 (ref 5.0–8.0)

## 2022-11-14 MED ORDER — TRAMADOL HCL 50 MG PO TABS: 50.0000 mg | ORAL_TABLET | Freq: Three times a day (TID) | ORAL | 0 refills | Status: AC | PRN

## 2022-11-14 MED ORDER — DICLOFENAC SODIUM 75 MG PO TBEC: 75.0000 mg | DELAYED_RELEASE_TABLET | Freq: Two times a day (BID) | ORAL | 0 refills | Status: DC

## 2022-11-14 MED ORDER — KETOROLAC TROMETHAMINE 60 MG/2ML IM SOLN
60.0000 mg | Freq: Once | INTRAMUSCULAR | Status: AC
  Administered 2022-11-14: 60 mg via INTRAMUSCULAR

## 2022-11-14 MED ORDER — DICLOFENAC SODIUM 1 % EX GEL: 2.0000 g | Freq: Four times a day (QID) | CUTANEOUS | 1 refills | Status: AC
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
## 2022-11-14 NOTE — Progress Notes (Signed)
Patient ID: Tina Manning, female   DOB: 07/07/1991, 31 y.o.   MRN: [id number]   Tina Manning, is a 31 y.o. female  [id number]:[id number]  NKN:[id number]  DOB - 10-Mar-1991  Chief Complaint  Patient presents with   Back Pain       Subjective:   Tina Manning is a 31 y.o. female here today fc/o midline LBP at the L-s spine.  She awakened with the pain 2-3 mornings ago.  NKI.  No new or different activities.  She feels as though she has to urinate but cannot go sometimes.  This started in the last couple days.  Pain in radiating around to the flank and into the L leg.  It is happening a little on the R side but mostly the L.  No rash.  No fever.  No N/V.  Had a telehealth visit yesterday and they gave her flexeril which "took the edge off."  Toradol was listed as an allergy.  And documented on 04/24/2021;  however there is nothing charted around that time about a reaction and patient is adamant that she IS able to take toradol and she has taken it without any problem  No problems updated.  ALLERGIES: Allergies  Allergen Reactions   Bee Venom Anaphylaxis   Penicillins Nausea And Vomiting and Other (See Comments)    Intolerance     PAST MEDICAL HISTORY: Past Medical History:  Diagnosis Date   ADHD (attention deficit hyperactivity disorder)    Anemia    Anxiety    Asthma    Complication of anesthesia    COVID 01/2022   out of work for a week   Endometriosis    GERD (gastroesophageal reflux disease)    Heart rate fast    takes Metoprolol   History of kidney stones    Pneumonia    PONV (postoperative nausea and vomiting)    Pre-diabetes    Tachycardia     MEDICATIONS AT HOME: Prior to Admission medications   Medication Sig Start Date End Date Taking? Authorizing Provider  albuterol (VENTOLIN HFA) 108 (90 Base) MCG/ACT inhaler Inhale 2 puffs into the lungs every 6 (six) hours as needed for wheezing or shortness of breath. 08/12/22  Yes Hawks, Christy A, FNP  atorvastatin  (LIPITOR) 40 MG tablet TAKE 1 TABLET(40 MG) BY MOUTH DAILY 10/27/21  Yes Zonia Kief, Amy J, NP  Budeson-Glycopyrrol-Formoterol (BREZTRI AEROSPHERE) 160-9-4.8 MCG/ACT AERO Inhale 2 puffs into the lungs in the morning and at bedtime. 08/30/22  Yes Cobb, Ruby Cola, NP  budesonide (PULMICORT) 0.5 MG/2ML nebulizer solution Take 2 mLs (0.5 mg total) by nebulization in the morning and at bedtime. 08/30/22  Yes Cobb, Ruby Cola, NP  busPIRone (BUSPAR) 5 MG tablet Take 1 tablet (5 mg total) by mouth 2 (two) times daily. Patient taking differently: Take 10 mg by mouth 2 (two) times daily. 01/19/22 01/19/23 Yes Thresa Ross, MD  cyclobenzaprine (FLEXERIL) 10 MG tablet Take 1 tablet (10 mg total) by mouth 3 (three) times daily as needed for muscle spasms. 11/13/22  Yes Waldon Merl, PA-C  hydrOXYzine (VISTARIL) 50 MG capsule Take 1 capsule (50 mg total) by mouth every 8 (eight) hours as needed. 09/29/21  Yes Zonia Kief, Amy J, NP  ibuprofen (ADVIL) 600 MG tablet Take 1 tablet (600 mg total) by mouth every 8 (eight) hours as needed. 03/20/22  Yes Zonia Kief, Amy J, NP  ipratropium-albuterol (DUONEB) 0.5-2.5 (3) MG/3ML SOLN Take 3 mLs by nebulization in the morning, at noon, in the evening,  and at bedtime. Patient taking differently: Take 3 mLs by nebulization every 4 (four) hours as needed (Asthma). 01/04/22  Yes Zonia Kief, Amy J, NP  metFORMIN (GLUCOPHAGE) 500 MG tablet Take 1 tablet (500 mg total) by mouth 2 (two) times daily with a meal. 12/21/21  Yes Levie Heritage, DO  metoprolol succinate (TOPROL-XL) 25 MG 24 hr tablet TAKE 1 TABLET(25 MG) BY MOUTH DAILY 11/11/22  Yes Hawks, Christy A, FNP  mometasone-formoterol (DULERA) 200-5 MCG/ACT AERO Take 2 puffs first thing in am and then another 2 puffs about 12 hours later. 08/15/21  Yes Nyoka Cowden, MD  montelukast (SINGULAIR) 10 MG tablet Take 1 tablet (10 mg total) by mouth at bedtime. 09/12/21  Yes Glenford Bayley, NP  pantoprazole (PROTONIX) 40 MG tablet TAKE 1  TABLET(40 MG) BY MOUTH DAILY 01/11/22  Yes Glenford Bayley, NP  sertraline (ZOLOFT) 100 MG tablet Take 1 tablet (100 mg total) by mouth daily. 01/19/22 01/19/23 Yes Thresa Ross, MD  triamcinolone cream (KENALOG) 0.1 % Apply 1 Application topically 2 (two) times daily. 10/18/22  Yes Margaretann Loveless, PA-C  zolpidem (AMBIEN CR) 12.5 MG CR tablet Take 1 tablet (12.5 mg total) by mouth at bedtime. 10/30/22  Yes Olalere, Adewale A, MD  fluticasone (FLONASE) 50 MCG/ACT nasal spray Place 2 sprays into both nostrils daily. 03/28/22   Daphine Deutscher, Mary-Margaret, FNP    ROS: Neg HEENT Neg resp Neg cardiac Neg GI Neg GU Neg MS Neg psych Neg neuro  Objective:   Vitals:   11/14/22 1440  BP: 139/84  Pulse: (!) 112  Resp: 18  Temp: 98.1 F (36.7 C)  TempSrc: Oral  SpO2: 97%  Weight: (!) 310 lb (140.6 kg)   Exam General appearance : Awake, alert, not in any distress. Speech Clear. Not toxic looking;  morbidly obese.  Tearful on and off throughout visit.  Her partner is here with her.  Ambulates without assistance.  Walks slower than average.   HEENT: Atraumatic and Normocephalic Neck: Supple, no JVD. No cervical lymphadenopathy.  Chest: Good air entry bilaterally, CTAB.  No rales/rhonchi/wheezing CVS: S1 S2 regular, no murmurs.  Back: exquisitely TTP at L-S junction but lessens with distraction; no rash.  ROM ~70% of normal.  Neg SLR B.  LE DTR B=intact.  Sensory intact Extremities: B/L Lower Ext shows no edema, both legs are warm to touch Neurology: Awake alert, and oriented X 3, CN II-XII intact, Non focal Skin: No Rash  Data Review Lab Results  Component Value Date   HGBA1C 6.0 (H) 12/14/2021   HGBA1C 5.9 (H) 08/14/2021    Assessment & Plan   1. Acute midline low back pain with left-sided sciatica - ketorolac (TORADOL) injection 60 mg - DG Lumbar Spine Complete; Future - Basic metabolic panel - CBC with Differential/Platelet Voltaren 75mg  bid X 1 week then prn pain.  #60.   Topical voltaren 1% sent as well.   Tramadol 50mg  #15 sent  2. History of kidney stones See #1 - DG Abd 1 View; Future - Basic metabolic panel - CBC with Differential/Platelet  3. Urinary hesitancy No infection on dip - CBC with Differential/Platelet - POCT URINALYSIS DIP (CLINITEK)    Return in about 3 weeks (around 12/05/2022) for recheck back with PCP.  The patient was given clear instructions to go to ER or return to medical center if symptoms don't improve, worsen or new problems develop. The patient verbalized understanding. The patient was told to call to get lab results if  they haven't heard anything in the next week.      Georgian Co, PA-C [facility] [place], [place] [phone]   11/14/2022, 3:30 PM

## 2022-11-14 NOTE — Patient Instructions (Signed)
Drink 80-100 ounces water daily.  You are currently dehydrated

## 2022-11-15 LAB — CBC WITH DIFFERENTIAL/PLATELET
Basophils Absolute: 0.1 10*3/uL (ref 0.0–0.2)
Basos: 1 %
EOS (ABSOLUTE): 0.2 10*3/uL (ref 0.0–0.4)
Eos: 2 %
Hematocrit: 41.7 % (ref 34.0–46.6)
Hemoglobin: 13 g/dL (ref 11.1–15.9)
Immature Grans (Abs): 0 10*3/uL (ref 0.0–0.1)
Immature Granulocytes: 0 %
Lymphocytes Absolute: 2.3 10*3/uL (ref 0.7–3.1)
Lymphs: 20 %
MCH: 24.7 pg — ABNORMAL LOW (ref 26.6–33.0)
MCHC: 31.2 g/dL — ABNORMAL LOW (ref 31.5–35.7)
MCV: 79 fL (ref 79–97)
Monocytes Absolute: 0.6 10*3/uL (ref 0.1–0.9)
Monocytes: 6 %
Neutrophils Absolute: 8 10*3/uL — ABNORMAL HIGH (ref 1.4–7.0)
Neutrophils: 71 %
Platelets: 493 10*3/uL — ABNORMAL HIGH (ref 150–450)
RBC: 5.26 x10E6/uL (ref 3.77–5.28)
RDW: 15.6 % — ABNORMAL HIGH (ref 11.7–15.4)
WBC: 11.2 10*3/uL — ABNORMAL HIGH (ref 3.4–10.8)

## 2022-11-15 LAB — BASIC METABOLIC PANEL
BUN/Creatinine Ratio: 12 (ref 9–23)
BUN: 11 mg/dL (ref 6–20)
CO2: 21 mmol/L (ref 20–29)
Calcium: 10 mg/dL (ref 8.7–10.2)
Chloride: 102 mmol/L (ref 96–106)
Creatinine, Ser: 0.95 mg/dL (ref 0.57–1.00)
Glucose: 84 mg/dL (ref 70–99)
Potassium: 4.6 mmol/L (ref 3.5–5.2)
Sodium: 140 mmol/L (ref 134–144)
eGFR: 82 mL/min/{1.73_m2} (ref >59)

## 2022-11-16 ENCOUNTER — Encounter: Payer: Self-pay | Admitting: Physician Assistant

## 2022-11-18 ENCOUNTER — Encounter: Payer: Self-pay | Admitting: Physician Assistant

## 2022-11-19 ENCOUNTER — Other Ambulatory Visit: Payer: Self-pay | Admitting: Physician Assistant

## 2022-11-19 ENCOUNTER — Other Ambulatory Visit: Payer: Self-pay | Admitting: Family

## 2022-11-19 DIAGNOSIS — M5442 Lumbago with sciatica, left side: Secondary | ICD-10-CM

## 2022-11-19 MED ORDER — CELECOXIB 100 MG PO CAPS: 100.0000 mg | ORAL_CAPSULE | Freq: Two times a day (BID) | ORAL | 1 refills | Status: DC

## 2022-11-19 NOTE — Telephone Encounter (Signed)
Letter complete.

## 2022-11-20 ENCOUNTER — Encounter: Payer: Self-pay | Admitting: General Practice

## 2022-11-20 ENCOUNTER — Encounter: Payer: Self-pay | Admitting: Family

## 2022-11-21 ENCOUNTER — Other Ambulatory Visit: Payer: Self-pay

## 2022-11-21 DIAGNOSIS — K219 Gastro-esophageal reflux disease without esophagitis: Secondary | ICD-10-CM

## 2022-11-21 MED ORDER — PANTOPRAZOLE SODIUM 40 MG PO TBEC: DELAYED_RELEASE_TABLET | ORAL | 1 refills | Status: DC

## 2022-11-25 ENCOUNTER — Encounter: Payer: Self-pay | Admitting: Podiatrist

## 2022-11-25 ENCOUNTER — Encounter: Payer: Self-pay | Admitting: Obstetrics & Gynecology

## 2022-11-28 ENCOUNTER — Ambulatory Visit: Payer: Commercial Managed Care - HMO | Admitting: Orthopaedic Surgery

## 2022-11-28 ENCOUNTER — Encounter: Payer: Self-pay | Admitting: Orthopaedic Surgery

## 2022-11-28 ENCOUNTER — Other Ambulatory Visit: Payer: Self-pay | Admitting: Physician Assistant

## 2022-11-28 ENCOUNTER — Encounter: Payer: Self-pay | Admitting: Physician Assistant

## 2022-11-28 DIAGNOSIS — M545 Low back pain, unspecified: Secondary | ICD-10-CM

## 2022-11-28 MED ORDER — CYCLOBENZAPRINE HCL 10 MG PO TABS: ORAL_TABLET | ORAL | 0 refills | Status: DC

## 2022-11-28 NOTE — Progress Notes (Deleted)
Patient ID: Tina Manning, female    DOB: Jul 11, 1991  MRN: [id number]  CC: Follow-Up  Subjective: Sai Moura is a 31 y.o. female who presents for follow-up.   Her concerns today include:  11/14/2022 per Georgian Co, PA note: 1. Acute midline low back pain with left-sided sciatica - ketorolac (TORADOL) injection 60 mg - DG Lumbar Spine Complete; Future - Basic metabolic panel - CBC with Differential/Platelet Voltaren 75mg  bid X 1 week then prn pain.  #60.  Topical voltaren 1% sent as well.   Tramadol 50mg  #15 sent   2. History of kidney stones See #1 - DG Abd 1 View; Future - Basic metabolic panel - CBC with Differential/Platelet   3. 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 8 g 0   atorvastatin (LIPITOR) 40 MG tablet TAKE 1  TABLET(40 MG) BY MOUTH DAILY 90 tablet 0   Budeson-Glycopyrrol-Formoterol (BREZTRI AEROSPHERE) 160-9-4.8 MCG/ACT AERO Inhale 2 puffs into the lungs in the morning and at bedtime. 1 each 0   budesonide (PULMICORT) 0.5 MG/2ML nebulizer solution Take 2 mLs (0.5 mg total) by nebulization in the morning and at bedtime. 60 mL 5   busPIRone (BUSPAR) 5 MG tablet Take 1 tablet (5 mg total) by mouth 2 (two) times daily. (Patient taking differently: Take 10 mg by mouth 2 (two) times daily.) 60 tablet 1   celecoxib (CELEBREX) 100 MG capsule Take 1 capsule (100 mg total) by mouth 2 (two) times daily. 60 capsule 1   cyclobenzaprine (FLEXERIL) 10 MG tablet Take 1 tablet (10 mg total) by mouth 3 (three) times daily as needed for muscle spasms. 30 tablet 0   diclofenac Sodium (VOLTAREN ARTHRITIS PAIN) 1 % GEL Apply 2 g topically 4 (four) times daily. 100 g 1   fluticasone (FLONASE) 50 MCG/ACT nasal spray Place 2 sprays into both nostrils daily. 16 g 6   hydrOXYzine (VISTARIL) 50 MG capsule Take 1 capsule (50 mg total) by mouth every 8 (eight) hours as needed. 30 capsule 0   ibuprofen (ADVIL) 600 MG tablet Take 1 tablet (600 mg total) by mouth every 8 (eight) hours as needed. 30 tablet 0   ipratropium-albuterol (DUONEB) 0.5-2.5 (3) MG/3ML SOLN Take 3 mLs by nebulization in the morning, at noon, in the evening, and at bedtime. (Patient taking differently: Take 3 mLs by nebulization every 4 (four) hours  as needed (Asthma).) 360 mL 2   metFORMIN (GLUCOPHAGE) 500 MG tablet Take 1 tablet (500 mg total) by mouth 2 (two) times daily with a meal. 60 tablet 5   metoprolol succinate (TOPROL-XL) 25 MG 24 hr tablet TAKE 1 TABLET(25 MG) BY MOUTH DAILY 30 tablet 0   mometasone-formoterol (DULERA) 200-5 MCG/ACT AERO Take 2 puffs first thing in am and then another 2 puffs about 12 hours later. 1 each 11   montelukast (SINGULAIR) 10 MG tablet Take 1 tablet (10 mg total) by mouth at bedtime. 30 tablet 11   pantoprazole (PROTONIX) 40 MG  tablet TAKE 1 TABLET(40 MG) BY MOUTH DAILY 90 tablet 1   sertraline (ZOLOFT) 100 MG tablet Take 1 tablet (100 mg total) by mouth daily. 30 tablet 1   triamcinolone cream (KENALOG) 0.1 % Apply 1 Application topically 2 (two) times daily. 80 g 0   zolpidem (AMBIEN CR) 12.5 MG CR tablet Take 1 tablet (12.5 mg total) by mouth at bedtime. 30 tablet 1   No current facility-administered medications on file prior to visit.    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    Birth control/protection: None  Other Topics Concern   Not on file  Social History Narrative   Not on file   Social Determinants of Health   Financial Resource Strain: Not on file  Food Insecurity: Not on file  Transportation Needs: Not on file  Physical Activity: Not on file  Stress: Not on file  Social Connections: Not on file  Intimate Partner Violence: Not on file    Family History  Problem Relation Age of Onset   Heart attack Mother    Hypertension Mother    Breast cancer Mother    Polycystic ovary syndrome Mother    Heart disease Father    Hypertension Father    Liver cancer Father     Past Surgical History:  Procedure Laterality Date   CHOLECYSTECTOMY     DILITATION & CURRETTAGE/HYSTROSCOPY WITH NOVASURE ABLATION N/A 03/05/2022   Procedure: DILATATION & CURETTAGE/HYSTEROSCOPY WITH NOVASURE ABLATION;  Surgeon: Willodean Rosenthal, MD;  Location: [facility] OR;   Service: Gynecology;  Laterality: N/A;   OPEN REDUCTION INTERNAL FIXATION (ORIF) DISTAL RADIAL FRACTURE Right 05/15/2021   Procedure: OPEN REDUCTION INTERNAL FIXATION (ORIF)RIGHT  DISTAL RADIAL FRACTURE;  Surgeon: Cammy Copa, MD;  Location: [facility] OR;  Service: Orthopedics;  Laterality: Right;   TYMPANOSTOMY TUBE PLACEMENT     as a baby    ROS: Review of Systems Negative except as stated above  PHYSICAL EXAM: There were no vitals taken for this visit.  Physical Exam  {female adult master:310786} {female adult master:[id number]}     Latest Ref Rng & Units 11/14/2022    3:16 PM 03/05/2022    9:27 AM 11/13/2021    9:49 AM  CMP  Glucose  70 - 99 mg/dL 84  90  96   BUN 6 - 20 mg/dL 11  8  11    Creatinine 0.57 - 1.00 mg/dL  4.14  2.39   Sodium 134 - 144 mmol/L 140  137  137   Potassium 3.5 - 5.2 mmol/L 4.6  4.1  3.8   Chloride 96 - 106 mmol/L 102  105  107   CO2 20 - 29 mmol/L 21  23  22    Calcium 8.7 - 10.2 mg/dL 5.32  9.1  8.5    Lipid Panel     Component Value Date/Time   CHOL 211 (H) 08/14/2021 1724   TRIG 188 (H) 08/14/2021 1724   HDL 38 (L) 08/14/2021 1724   CHOLHDL 5.6 (H) 08/14/2021 1724   LDLCALC 139 (H) 08/14/2021 1724    CBC    Component Value Date/Time   WBC 11.2 (H) 11/14/2022 1516   WBC 11.0 (H) 03/05/2022 0927   RBC 5.26 11/14/2022 1516   RBC 4.90 03/05/2022 0927   HGB 13.0 11/14/2022 1516   HCT 41.7 11/14/2022 1516   PLT 493 (H) 11/14/2022 1516   MCV 79 11/14/2022 1516   MCH 24.7 (L) 11/14/2022 1516   MCH 25.1 (L) 03/05/2022 0927   MCHC 31.2 (L) 11/14/2022 1516   MCHC 31.2 03/05/2022 0927   RDW 15.6 (H) 11/14/2022 1516   LYMPHSABS 2.3 11/14/2022 1516   MONOABS 0.7 11/13/2021 0949   EOSABS 0.2 11/14/2022 1516   BASOSABS 0.1 11/14/2022 1516    ASSESSMENT AND PLAN:  There are no diagnoses linked to this encounter.   Patient was given the opportunity to ask questions.  Patient verbalized understanding of the plan and was able to repeat  key elements of the plan. Patient was given clear instructions to go to Emergency Department or return to medical center if symptoms don't improve, worsen, or new problems develop.The patient verbalized understanding.   No orders of the defined types were placed in this encounter.    Requested Prescriptions    No prescriptions requested or ordered in this encounter    No follow-ups on file.  11/16/2022, NP

## 2022-11-29 ENCOUNTER — Ambulatory Visit: Payer: Commercial Managed Care - HMO | Admitting: Orthopaedic Surgery

## 2022-11-29 ENCOUNTER — Encounter: Payer: Self-pay | Admitting: Physician Assistant

## 2022-12-03 ENCOUNTER — Encounter: Payer: Commercial Managed Care - HMO | Admitting: Family

## 2022-12-03 ENCOUNTER — Other Ambulatory Visit: Payer: Self-pay | Admitting: Physician Assistant

## 2022-12-03 MED ORDER — PREDNISONE 20 MG PO TABS: ORAL_TABLET | ORAL | 0 refills | Status: DC

## 2022-12-03 NOTE — Progress Notes (Signed)
Erroneous encounter-disregard

## 2022-12-05 ENCOUNTER — Ambulatory Visit (INDEPENDENT_AMBULATORY_CARE_PROVIDER_SITE_OTHER): Payer: Commercial Managed Care - HMO | Admitting: Family

## 2022-12-05 ENCOUNTER — Other Ambulatory Visit: Payer: Self-pay | Admitting: Family

## 2022-12-05 ENCOUNTER — Telehealth: Payer: Commercial Managed Care - HMO | Admitting: Family Medicine

## 2022-12-05 ENCOUNTER — Ambulatory Visit: Payer: Commercial Managed Care - HMO | Admitting: Family

## 2022-12-05 ENCOUNTER — Encounter: Payer: Self-pay | Admitting: Family

## 2022-12-05 DIAGNOSIS — M5137 Other intervertebral disc degeneration, lumbosacral region: Secondary | ICD-10-CM | POA: Diagnosis not present

## 2022-12-05 DIAGNOSIS — M545 Low back pain, unspecified: Secondary | ICD-10-CM

## 2022-12-05 DIAGNOSIS — M62838 Other muscle spasm: Secondary | ICD-10-CM

## 2022-12-05 DIAGNOSIS — G8929 Other chronic pain: Secondary | ICD-10-CM

## 2022-12-05 DIAGNOSIS — R3989 Other symptoms and signs involving the genitourinary system: Secondary | ICD-10-CM | POA: Diagnosis not present

## 2022-12-05 DIAGNOSIS — R252 Cramp and spasm: Secondary | ICD-10-CM | POA: Diagnosis not present

## 2022-12-05 DIAGNOSIS — M5442 Lumbago with sciatica, left side: Secondary | ICD-10-CM

## 2022-12-05 DIAGNOSIS — M51379 Other intervertebral disc degeneration, lumbosacral region without mention of lumbar back pain or lower extremity pain: Secondary | ICD-10-CM

## 2022-12-05 MED ORDER — CYCLOBENZAPRINE HCL 10 MG PO TABS: ORAL_TABLET | ORAL | 1 refills | Status: DC

## 2022-12-05 NOTE — Progress Notes (Signed)
Virtual Visit via Telephone Note  I connected with Tina Manning, on 12/05/2022 at 1:02 PM by telephone and verified that I am speaking with the correct person using two identifiers.  Consent: I discussed the limitations, risks, security and privacy concerns of performing an evaluation and management service by telephone and the availability of in person appointments. I also discussed with the patient that there may be a patient responsible charge related to this service. The patient expressed understanding and agreed to proceed.   Location of Patient: Home  Location of Provider: [facility]   Persons participating in Telemedicine visit: Tina Manning Ricky Stabs, NP Margorie John, CMA  History of Present Illness: Tina Manning is a 31 y.o. female who presents for follow-up.   11/14/2022 per Georgian Co, PA note: 1. Acute midline low back pain with left-sided sciatica - ketorolac (TORADOL) injection 60 mg - DG Lumbar Spine Complete; Future - Basic metabolic panel - CBC with Differential/Platelet Voltaren 75mg  bid X 1 week then prn pain.  #60.  Topical voltaren 1% sent as well.   Tramadol 50mg  #15 sent   2. History of kidney stones See #1 - DG Abd 1 View; Future - Basic metabolic panel - CBC with Differential/Platelet   3. Urinary hesitancy No infection on dip - CBC with Differential/Platelet - POCT URINALYSIS DIP (CLINITEK)  Today's visit 12/05/2022: Reports 3 days ago seen at the ED in [facility] for bilateral leg cramps. States physician at the ED prescribed Lidocaine patch, Prednisone, Flexeril, Ibuprofen and recommended referral to Neurology for MRI. She tried to schedule an appointment with Neurology but was told her health insurance requires a referral. Leg cramps made worse with walking/applying weight. States her back pain improved some compared to before. No further issues/concerns.   Past Medical History:   Diagnosis Date   ADHD (attention deficit hyperactivity disorder)    Anemia    Anxiety    Asthma    Complication of anesthesia    COVID 01/2022   out of work for a week   Endometriosis    GERD (gastroesophageal reflux disease)    Heart rate fast    takes Metoprolol   History of kidney stones    Pneumonia    PONV (postoperative nausea and vomiting)    Pre-diabetes    Tachycardia    Allergies  Allergen Reactions   Bee Venom Anaphylaxis   Penicillins Nausea And Vomiting and Other (See Comments)    Intolerance     Current Outpatient Medications on File Prior to Visit  Medication Sig Dispense Refill   albuterol (VENTOLIN HFA) 108 (90 Base) MCG/ACT inhaler Inhale 2 puffs into the lungs every 6 (six) hours as needed for wheezing or shortness of breath. 8 g 0   atorvastatin (LIPITOR) 40 MG tablet TAKE 1 TABLET(40 MG) BY MOUTH DAILY 90 tablet 0   Budeson-Glycopyrrol-Formoterol (BREZTRI AEROSPHERE) 160-9-4.8 MCG/ACT AERO Inhale 2 puffs into the lungs in the morning and at bedtime. 1 each 0   budesonide (PULMICORT) 0.5 MG/2ML nebulizer solution Take 2 mLs (0.5 mg total) by nebulization in the morning and at bedtime. 60 mL 5   busPIRone (BUSPAR) 5 MG tablet Take 1 tablet (5 mg total) by mouth 2 (two) times daily. (Patient taking differently: Take 10 mg by mouth 2 (two) times daily.) 60 tablet 1   celecoxib (CELEBREX) 100 MG capsule Take 1 capsule (100 mg total) by mouth 2 (two) times daily. 60 capsule 1   cyclobenzaprine (  FLEXERIL) 10 MG tablet Take 1/2 to 1 tablet up to 3 times daily prn muscle spasm 40 tablet 0   diclofenac Sodium (VOLTAREN ARTHRITIS PAIN) 1 % GEL Apply 2 g topically 4 (four) times daily. 100 g 1   fluticasone (FLONASE) 50 MCG/ACT nasal spray Place 2 sprays into both nostrils daily. 16 g 6   hydrOXYzine (VISTARIL) 50 MG capsule Take 1 capsule (50 mg total) by mouth every 8 (eight) hours as needed. 30 capsule 0   ibuprofen (ADVIL) 600 MG tablet Take 1 tablet (600 mg  total) by mouth every 8 (eight) hours as needed. 30 tablet 0   ipratropium-albuterol (DUONEB) 0.5-2.5 (3) MG/3ML SOLN Take 3 mLs by nebulization in the morning, at noon, in the evening, and at bedtime. (Patient taking differently: Take 3 mLs by nebulization every 4 (four) hours as needed (Asthma).) 360 mL 2   metFORMIN (GLUCOPHAGE) 500 MG tablet Take 1 tablet (500 mg total) by mouth 2 (two) times daily with a meal. 60 tablet 5   metoprolol succinate (TOPROL-XL) 25 MG 24 hr tablet TAKE 1 TABLET(25 MG) BY MOUTH DAILY 30 tablet 0   mometasone-formoterol (DULERA) 200-5 MCG/ACT AERO Take 2 puffs first thing in am and then another 2 puffs about 12 hours later. 1 each 11   montelukast (SINGULAIR) 10 MG tablet Take 1 tablet (10 mg total) by mouth at bedtime. 30 tablet 11   pantoprazole (PROTONIX) 40 MG tablet TAKE 1 TABLET(40 MG) BY MOUTH DAILY 90 tablet 1   predniSONE (DELTASONE) 20 MG tablet 3,3,3,2,2,2,1,1,1 take each days dose in the morning with food 18 tablet 0   sertraline (ZOLOFT) 100 MG tablet Take 1 tablet (100 mg total) by mouth daily. 30 tablet 1   triamcinolone cream (KENALOG) 0.1 % Apply 1 Application topically 2 (two) times daily. 80 g 0   zolpidem (AMBIEN CR) 12.5 MG CR tablet Take 1 tablet (12.5 mg total) by mouth at bedtime. 30 tablet 1   No current facility-administered medications on file prior to visit.    Observations/Objective: Alert and oriented x 3. Not in acute distress. Physical examination not completed as this is a telemedicine visit.  Assessment and Plan: 1. Degenerative disc disease at L5-S1 level 2. Chronic midline low back pain with left-sided sciatica - Continue present management.  - Referral to Orthopedic Surgery for further evaluation/management.  - Ambulatory referral to Orthopedic Surgery  3. Bilateral leg cramps 4. Muscle spasms of both lower extremities - Continue present management.  - Referral to Neurology for further evaluation/management.  -  Ambulatory referral to Neurology   Follow Up Instructions: Referral to Orthopedics and Neurology. Follow-up with primary provider as scheduled.    Patient was given clear instructions to go to Emergency Department or return to medical center if symptoms don't improve, worsen, or new problems develop.The patient verbalized understanding.  I discussed the assessment and treatment plan with the patient. The patient was provided an opportunity to ask questions and all were answered. The patient agreed with the plan and demonstrated an understanding of the instructions.   The patient was advised to call back or seek an in-person evaluation if the symptoms worsen or if the condition fails to improve as anticipated.    I provided 5 minutes total of non-face-to-face time during this encounter.   Rema Fendt, NP  [facility] [place], [place] [phone] 12/05/2022, 1:02 PM

## 2022-12-05 NOTE — Telephone Encounter (Signed)
Order complete. 

## 2022-12-06 MED ORDER — NITROFURANTOIN MONOHYD MACRO 100 MG PO CAPS: 100.0000 mg | ORAL_CAPSULE | Freq: Two times a day (BID) | ORAL | 0 refills | Status: AC

## 2022-12-06 NOTE — Progress Notes (Signed)

## 2022-12-07 ENCOUNTER — Institutional Professional Consult (permissible substitution): Payer: Commercial Managed Care - HMO | Admitting: Obstetrics and Gynecology

## 2022-12-07 ENCOUNTER — Encounter: Payer: Self-pay | Admitting: General Practice

## 2022-12-09 MED ORDER — ONDANSETRON HCL 4 MG PO TABS: 4.0000 mg | ORAL_TABLET | Freq: Three times a day (TID) | ORAL | 0 refills | Status: DC | PRN

## 2022-12-09 NOTE — Addendum Note (Signed)
Addended by: Jannifer Rodney A on: 12/09/2022 07:18 AM   Modules accepted: Orders

## 2022-12-13 ENCOUNTER — Telehealth: Payer: Commercial Managed Care - HMO | Admitting: Physician Assistant

## 2022-12-13 DIAGNOSIS — M5441 Lumbago with sciatica, right side: Secondary | ICD-10-CM | POA: Diagnosis not present

## 2022-12-13 DIAGNOSIS — M5442 Lumbago with sciatica, left side: Secondary | ICD-10-CM

## 2022-12-14 NOTE — Progress Notes (Signed)
Because you have been given flexeril, diclofenac and tramadol already, I feel your condition warrants further evaluation and I recommend that you be seen in a face to face visit. The only things we can offer through virtual visits are ibuprofen/naproxen, which is in the same medication class as Diclofenac, and muslce relaxers, which you already have as well.    NOTE: There will be NO CHARGE for this eVisit   If you are having a true medical emergency please call 911.      For an urgent face to face visit, [place] has seven urgent care centers for your convenience:     [facility] Health Urgent Care Center at [facility] Directions [phone] [address] [place], [place] [address]    [facility] Health Urgent Care Center [facility]) Get Driving Directions [phone] [address] [place], [place] [address]  [facility] Health Urgent Care Center [facility]) Get Driving Directions [phone] [address] [place],  [place]  [address]  [facility] Health Urgent Care Center [facility] - at [facility] Directions  [phone] [address] [place],  [place] [address]   [facility] Health Urgent Care at [facility] Get Driving Directions [phone] [address] [place], [place] [address]   [facility] Health Urgent Care at [facility] Get Driving Directions  [phone] [address] [place], [place] [address]   [facility] Health Urgent Care at [facility] Directions [phone] [address], [address] [place], [place] [address]  Your MyChart E-visit questionnaire answers were reviewed by a board certified advanced clinical practitioner to complete your personal care plan based on your specific symptoms.  Thank you for using e-Visits.   I have spent 5 minutes in review of e-visit questionnaire, review and updating patient chart, medical decision making and response to  patient.   Margaretann Loveless, PA-C

## 2022-12-27 ENCOUNTER — Encounter: Payer: Self-pay | Admitting: Family

## 2022-12-29 DIAGNOSIS — M545 Low back pain, unspecified: Secondary | ICD-10-CM | POA: Insufficient documentation

## 2022-12-29 DIAGNOSIS — G8929 Other chronic pain: Secondary | ICD-10-CM | POA: Insufficient documentation

## 2023-01-04 ENCOUNTER — Encounter: Payer: Self-pay | Admitting: Family

## 2023-01-06 ENCOUNTER — Other Ambulatory Visit: Payer: Self-pay | Admitting: Primary Care

## 2023-01-06 DIAGNOSIS — J4520 Mild intermittent asthma, uncomplicated: Secondary | ICD-10-CM

## 2023-01-07 ENCOUNTER — Encounter: Payer: Self-pay | Admitting: Orthopaedic Surgery

## 2023-01-07 ENCOUNTER — Other Ambulatory Visit: Payer: Self-pay

## 2023-01-07 DIAGNOSIS — R000 Tachycardia, unspecified: Secondary | ICD-10-CM

## 2023-01-07 MED ORDER — METOPROLOL SUCCINATE ER 25 MG PO TB24: ORAL_TABLET | ORAL | 0 refills | Status: DC

## 2023-01-09 ENCOUNTER — Ambulatory Visit (INDEPENDENT_AMBULATORY_CARE_PROVIDER_SITE_OTHER): Payer: BLUE CROSS/BLUE SHIELD | Admitting: Orthopaedic Surgery

## 2023-01-09 DIAGNOSIS — M5441 Lumbago with sciatica, right side: Secondary | ICD-10-CM | POA: Diagnosis not present

## 2023-01-09 MED ORDER — OXYCODONE HCL 5 MG PO TABS: 5.0000 mg | ORAL_TABLET | Freq: Two times a day (BID) | ORAL | 0 refills | Status: DC | PRN

## 2023-01-09 MED ORDER — METAXALONE 800 MG PO TABS: 800.0000 mg | ORAL_TABLET | Freq: Three times a day (TID) | ORAL | 0 refills | Status: DC

## 2023-01-09 MED ORDER — IBUPROFEN 800 MG PO TABS: 800.0000 mg | ORAL_TABLET | Freq: Three times a day (TID) | ORAL | 2 refills | Status: DC | PRN

## 2023-01-09 NOTE — Progress Notes (Signed)
Office Visit Note   Patient: Tina Manning           Date of Birth: [birth year]           MRN: [id number] Visit Date: 01/09/2023              Requested by: Camillia Herter, NP [place],  [place] [address] PCP: Camillia Herter, NP   Assessment & Plan: Visit Diagnoses:  1. Acute right-sided low back pain with right-sided sciatica     Plan: I did review the MRI of the lumbar spine which shows a large L4-5 disc herniation.  Given these findings I will make referral to Dr. Laurance Flatten for surgical consultation.  In the meantime I will refill ibuprofen, oxycodone, Skelaxin.  Follow-Up Instructions: No follow-ups on file.   Orders:  Orders Placed This Encounter  Procedures   Ambulatory referral to Orthopedic Surgery   Meds ordered this encounter  Medications   ibuprofen (ADVIL) 800 MG tablet    Sig: Take 1 tablet (800 mg total) by mouth every 8 (eight) hours as needed.    Dispense:  30 tablet    Refill:  2   oxyCODONE (OXY IR/ROXICODONE) 5 MG immediate release tablet    Sig: Take 1-2 tablets (5-10 mg total) by mouth 2 (two) times daily as needed for severe pain.    Dispense:  20 tablet    Refill:  0   metaxalone (SKELAXIN) 800 MG tablet    Sig: Take 1 tablet (800 mg total) by mouth 3 (three) times daily.    Dispense:  20 tablet    Refill:  0      Procedures: No procedures performed   Clinical Data: No additional findings.   Subjective: Chief Complaint  Patient presents with   Lower Back - Pain    HPI Tina Manning is a 32 year old female comes in for low back pain and left leg radiculopathy.  Was recently admitted to [facility] for severe low back pain.  MRI showed large L4-5 disc herniation with with impingement of the left S1 nerve root.  She started having symptoms 1 December.  Denies any bowel or bladder dysfunction.  Has increased pain with sitting and standing.  States that she had 1 ESI while she was hospitalized.  She is  currently taking prednisone states that she has numbness and pain down the entire leg into the foot. Review of Systems  Constitutional: Negative.   HENT: Negative.    Eyes: Negative.   Respiratory: Negative.    Cardiovascular: Negative.   Endocrine: Negative.   Musculoskeletal: Negative.   Neurological: Negative.   Hematological: Negative.   Psychiatric/Behavioral: Negative.    All other systems reviewed and are negative.    Objective: Vital Signs: There were no vitals taken for this visit.  Physical Exam Vitals and nursing note reviewed.  Constitutional:      Appearance: She is well-developed.  HENT:     Head: Atraumatic.     Nose: Nose normal.  Eyes:     Extraocular Movements: Extraocular movements intact.  Cardiovascular:     Pulses: Normal pulses.  Pulmonary:     Effort: Pulmonary effort is normal.  Abdominal:     Palpations: Abdomen is soft.  Musculoskeletal:     Cervical back: Neck supple.  Skin:    General: Skin is warm.     Capillary Refill: Capillary refill takes less than 2 seconds.  Neurological:     Mental Status:  She is alert. Mental status is at baseline.  Psychiatric:        Behavior: Behavior normal.        Thought Content: Thought content normal.        Judgment: Judgment normal.     Ortho Exam Examination of left lower extremity shows weakness with great toe extension and ankle plantarflexion.  She has normal gait. Specialty Comments:  No specialty comments available.  Imaging: No results found.   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   Hyperlipidemia 08/15/2021   Prediabetes 08/15/2021   Morbid obesity due to excess calories ([place]) 08/15/2021   Closed fracture of lower end of right radius with routine healing    Anxiety and depression 08/09/2011   Endometriosis 03/27/2009   Tachycardia 11/27/1998   Severe asthma with acute exacerbation 04/24/1994   Past Medical History:  Diagnosis Date   ADHD (attention deficit hyperactivity disorder)    Anemia    Anxiety    Asthma    Complication of anesthesia    COVID 01/2022   out of work for a week   Endometriosis    GERD (gastroesophageal reflux disease)    Heart rate fast    takes Metoprolol   History of kidney stones    Pneumonia    PONV (postoperative nausea and vomiting)    Pre-diabetes    Tachycardia     Family History  Problem Relation Age of Onset   Heart attack Mother    Hypertension Mother    Breast cancer Mother    Polycystic ovary syndrome Mother    Heart disease Father    Hypertension Father    Liver cancer Father     Past Surgical History:  Procedure Laterality Date   CHOLECYSTECTOMY     DILITATION & CURRETTAGE/HYSTROSCOPY WITH NOVASURE ABLATION N/A 03/05/2022   Procedure: DILATATION & CURETTAGE/HYSTEROSCOPY WITH NOVASURE ABLATION;  Surgeon: Lavonia Drafts, MD;  Location: [place];  Service: Gynecology;  Laterality: N/A;   OPEN REDUCTION INTERNAL FIXATION (ORIF) DISTAL RADIAL FRACTURE Right 05/15/2021   Procedure: OPEN REDUCTION INTERNAL FIXATION (ORIF)RIGHT  DISTAL RADIAL FRACTURE;  Surgeon: Meredith Pel, MD;  Location: [place];  Service: Orthopedics;  Laterality: Right;   TYMPANOSTOMY TUBE PLACEMENT     as a baby   Social History   Occupational History   Occupation: Firehouse [employer]  Tobacco Use   Smoking status: Never   Smokeless tobacco: Never  Vaping Use   Vaping Use: Never used  Substance and Sexual Activity   Alcohol use: No   Drug use: No   Sexual activity: Not Currently     Birth control/protection: None

## 2023-01-10 ENCOUNTER — Encounter: Payer: Self-pay | Admitting: Orthopaedic Surgery

## 2023-01-10 ENCOUNTER — Telehealth: Payer: BLUE CROSS/BLUE SHIELD | Admitting: Physician Assistant

## 2023-01-10 DIAGNOSIS — R110 Nausea: Secondary | ICD-10-CM

## 2023-01-10 MED ORDER — ONDANSETRON 4 MG PO TBDP: 4.0000 mg | ORAL_TABLET | Freq: Three times a day (TID) | ORAL | 0 refills | Status: DC | PRN

## 2023-01-10 NOTE — Progress Notes (Signed)
I have spent 5 minutes in review of e-visit questionnaire, review and updating patient chart, medical decision making and response to patient.   Marlisha Vanwyk Cody Jaz Mallick, PA-C    

## 2023-01-10 NOTE — Progress Notes (Signed)
E-Visit for Nausea and Vomiting   We are sorry that you are not feeling well. Here is how we plan to help!  Based on what you have shared with me it looks like you are having an intolerance to a new medication. I would recommend speaking with the prescribing provider if not improving after the first couple of doses to find an alternative. For tonight, I have prescribed zofran to use for nausea, letting it dissolve under the tongue as directed.  HOME CARE: Drink clear liquids.  This is very important! Dehydration (the lack of fluid) can lead to a serious complication.  Start off with 1 tablespoon every 5 minutes for 8 hours. You may begin eating bland foods after 8 hours without vomiting.  Start with saltine crackers, white bread, rice, mashed potatoes, applesauce. After 48 hours on a bland diet, you may resume a normal diet. Try to go to sleep.  Sleep often empties the stomach and relieves the need to vomit.  GET HELP RIGHT AWAY IF:  Your symptoms do not improve or worsen within 2 days after treatment. You have a fever for over 3 days. You cannot keep down fluids after trying the medication.  MAKE SURE YOU:  Understand these instructions. Will watch your condition. Will get help right away if you are not doing well or get worse.    Thank you for choosing an e-visit.  Your e-visit answers were reviewed by a board certified advanced clinical practitioner to complete your personal care plan. Depending upon the condition, your plan could have included both over the counter or prescription medications.  Please review your pharmacy choice. Make sure the pharmacy is open so you can pick up prescription now. If there is a problem, you may contact your provider through [employer] and have the prescription routed to another pharmacy.  Your safety is important to [place]. If you have drug allergies check your prescription carefully.   For the next 24 hours you can use MyChart to ask questions  about today's visit, request a non-urgent call back, or ask for a work or school excuse. You will get an email in the next two days asking about your experience. I hope that your e-visit has been valuable and will speed your recovery.

## 2023-01-11 ENCOUNTER — Other Ambulatory Visit: Payer: Self-pay | Admitting: Physician Assistant

## 2023-01-11 MED ORDER — ONDANSETRON 4 MG PO TBDP: 4.0000 mg | ORAL_TABLET | Freq: Three times a day (TID) | ORAL | 2 refills | Status: DC | PRN

## 2023-01-11 MED ORDER — METHOCARBAMOL 750 MG PO TABS: 750.0000 mg | ORAL_TABLET | Freq: Two times a day (BID) | ORAL | 2 refills | Status: DC | PRN

## 2023-01-11 NOTE — Telephone Encounter (Signed)
I sent in nausea meds, but I also sent in a diff muscle relaxer

## 2023-01-13 ENCOUNTER — Encounter: Payer: Self-pay | Admitting: Orthopaedic Surgery

## 2023-01-14 ENCOUNTER — Other Ambulatory Visit: Payer: Self-pay | Admitting: Physician Assistant

## 2023-01-14 MED ORDER — OXYCODONE HCL 5 MG PO TABS: 5.0000 mg | ORAL_TABLET | Freq: Two times a day (BID) | ORAL | 0 refills | Status: DC | PRN

## 2023-01-14 NOTE — Telephone Encounter (Signed)
She has a muscle relaxer and oxycodone that xu wrote on 1/17.  I am happy to send ina  steroid pac, but we cannot give her any stronger pain meds

## 2023-01-14 NOTE — Telephone Encounter (Signed)
Can try a heating pad.  Would not take any nsaids if on prednisone. Can take tylenol, but do not exceed more than on bottle

## 2023-01-14 NOTE — Telephone Encounter (Signed)
I sent in a refill of the oxycodone, but if she is in that much pain she may actually need to go to the hospital

## 2023-01-16 ENCOUNTER — Other Ambulatory Visit: Payer: Self-pay

## 2023-01-16 ENCOUNTER — Encounter: Payer: Self-pay | Admitting: Family

## 2023-01-16 ENCOUNTER — Encounter: Payer: Self-pay | Admitting: Orthopaedic Surgery

## 2023-01-16 DIAGNOSIS — M545 Low back pain, unspecified: Secondary | ICD-10-CM

## 2023-01-17 NOTE — Telephone Encounter (Signed)
[place], thanks

## 2023-01-17 NOTE — Telephone Encounter (Signed)
What all has she tried?  When does she see dr Laurance Flatten?

## 2023-01-18 ENCOUNTER — Encounter: Payer: Self-pay | Admitting: Internal Medicine

## 2023-01-18 ENCOUNTER — Encounter: Payer: Self-pay | Admitting: Orthopedic Surgery

## 2023-01-18 ENCOUNTER — Ambulatory Visit (INDEPENDENT_AMBULATORY_CARE_PROVIDER_SITE_OTHER): Payer: BLUE CROSS/BLUE SHIELD | Admitting: Orthopedic Surgery

## 2023-01-18 ENCOUNTER — Ambulatory Visit (INDEPENDENT_AMBULATORY_CARE_PROVIDER_SITE_OTHER): Payer: BLUE CROSS/BLUE SHIELD

## 2023-01-18 VITALS — BP 130/92 | HR 112 | Ht 65.0 in | Wt 310.0 lb

## 2023-01-18 DIAGNOSIS — M5441 Lumbago with sciatica, right side: Secondary | ICD-10-CM

## 2023-01-18 DIAGNOSIS — M5416 Radiculopathy, lumbar region: Secondary | ICD-10-CM | POA: Diagnosis not present

## 2023-01-18 DIAGNOSIS — M545 Low back pain, unspecified: Secondary | ICD-10-CM

## 2023-01-18 MED ORDER — CYCLOBENZAPRINE HCL 10 MG PO TABS: ORAL_TABLET | ORAL | 0 refills | Status: DC

## 2023-01-18 MED ORDER — OXYCODONE HCL 5 MG PO TABS: 5.0000 mg | ORAL_TABLET | Freq: Four times a day (QID) | ORAL | 0 refills | Status: AC | PRN

## 2023-01-18 NOTE — Addendum Note (Signed)
Addended by: Ileene Rubens on: 01/18/2023 11:24 AM   Modules accepted: Level of Service

## 2023-01-18 NOTE — Progress Notes (Addendum)
Orthopedic Spine Surgery Office Note  Assessment: Patient is a 32 y.o. female with low back pain that radiates into the left leg in a L5 distribution. MRI shows a large L4/5 left paracentral disc herniation. No symptoms of cauda equina or progressive weakness. BMI of 52   Plan: -Explained that initially conservative treatment is tried as a significant number of patients may experience relief with these treatment modalities. Discussed that the conservative treatments include:  -activity modification  -physical therapy  -over the counter pain medications  -medrol dosepak  -lumbar steroid injections -Patient has tried NSAIDs, oral steroids, injection, oxycodone, heat, ice  -Recommended pain management since she has tried all non-operative treatments but is not an operative candidate in my opinion given her weight. Her risk of complication is higher due to her weight. Her BMI is 52. I told her if her weight was lower, she would be an operative candidate in my practice. I said she could seek a second opinion and see if someone else in town has a higher BMI cut off. I provided her with the names of other spine surgeons in town -Refilled her oxycodone and flexeril. Explained that this would be a one time refill since I usually only use opioids to control post-operative pain -Return precautions for progressive weakness, loss of bowel/bladder control, groin numbness -Patient should return to office on an as needed basis   Patient expressed understanding of the plan and all questions were answered to the patient's satisfaction.   ___________________________________________________________________________   History:  Patient is a 32 y.o. female who presents today for lumbar spine.  Patient has had low back pain that radiates into her left leg since December 2023.  There is no trauma or injury that brought the pain.  Pain starts in her low back and radiates down the posterior aspect of the left buttock  into the left thigh and into the lateral aspect of the leg.  She says that her toes are numb.  No symptoms on the right side.  No other numbness or paresthesias.  Pain has been stable since onset.   Weakness: Yes, feels her left leg is weaker.  No other weakness noted Symptoms of imbalance: Denies Paresthesias and numbness: Yes, left toes feel numb.  No other numbness or paresthesias Bowel or bladder incontinence: Denies Saddle anesthesia: Denies  Treatments tried: NSAIDs, oral steroids, injection, oxycodone, heat, ice  Review of systems: Denies fevers and chills, night sweats, unexplained weight loss, history of cancer. Has had pain that wakes her at night  Past medical history: ADHD Anxiety Depression GERD Tachycardia Pre-diabetes  Allergies: penicillins  Past surgical history:  Cholecystectomy D&C uterus ORIF right distal radius fracture Ear tubes  Social history: Denies use of nicotine product (smoking, vaping, patches, smokeless) Alcohol use: denies Denies recreational drug use  Physical Exam:  BMI of 52  General: no acute distress, appears stated age, room smells of cigarettes Neurologic: alert, answering questions appropriately, following commands Respiratory: unlabored breathing on room air, symmetric chest rise Psychiatric: appropriate affect, normal cadence to speech   MSK (spine):  -Strength exam      Left  Right EHL    4/5  5/5 TA    4/5  5/5 GSC    5/5  5/5 Knee extension  4/5  5/5 Hip flexion   4/5  5/5  -Sensory exam    Sensation intact to light touch in L3-S1 nerve distributions of bilateral lower extremities  -Straight leg raise: positive -Contralateral straight leg raise: negative -  Clonus: no beats bilaterally  -Left hip exam: No pain to range of motion, negative [place] -Right hip exam: No pain to range of motion, negative [place]  Imaging: XR of the lumbar spine from 11/14/2022 was independently reviewed and interpreted,  showing disc height loss at L5/S1. No fracture or dislocation. No evidence of instability on flexion/extension views.   MRI of the lumbar spine from 12/29/2022 was independently reviewed and interpreted, showing L3/4 central disc herniation. Large left paracentral disc herniation at L4/5 with caudal migration of the disc behind the body of L5   Patient name: Tina Manning Patient MRN: [id number] Date of visit: 01/18/23

## 2023-01-21 ENCOUNTER — Other Ambulatory Visit: Payer: Self-pay | Admitting: Pulmonary Disease

## 2023-01-21 MED ORDER — ZOLPIDEM TARTRATE ER 12.5 MG PO TBCR: 12.5000 mg | EXTENDED_RELEASE_TABLET | Freq: Every day | ORAL | 1 refills | Status: DC

## 2023-01-21 NOTE — Telephone Encounter (Signed)
Nothing further needed 

## 2023-01-21 NOTE — Telephone Encounter (Signed)
Pt is requesting refill of "sleep medicine" which I assume is Ambien. I see that pt has seen Beth in the past to manage insomina and Dr. Ander Slade has sent in medications for pt as well as pt seeing Dr. Melvyn Novas to manage asthma. I did tell pt that she may need to schedule OV before refills can be sent. Beth and Dr. Ander Slade please advise. Thank you

## 2023-01-21 NOTE — Telephone Encounter (Signed)
Ambien refill sent in

## 2023-01-24 NOTE — Progress Notes (Deleted)
Patient ID: Tina Manning, female    DOB: 02/07/1991  MRN: [id number]  CC: Acid Reflux Follow-Up  Subjective: Tina Manning is a 32 y.o. female who presents for acid reflux follow-up.  Her concerns today include:  Patient message: been having troubles where everytime I eat I'm throwing up about half hour later it's messing with my sleep I'm waking up nauseated I have stomach ulcers and been taking my meds thinking that's what's wrong but it's been going on a week or so  Cards - tachycardia, Metoprolol    Patient Active Problem List   Diagnosis Date Noted   Carpal tunnel syndrome, right upper limb 09/10/2022   CAP (community acquired pneumonia) 08/30/2022   Abnormal uterine bleeding (AUB) 12/15/2021   Hirsutism 12/15/2021   Snoring 12/05/2021   Insomnia 11/13/2021   Sciatic nerve pain, right 10/31/2021   Acute right-sided low back pain with right-sided sciatica 10/31/2021   ASCUS with positive high risk HPV cervical 09/13/2021   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 20 tablet 2   albuterol (VENTOLIN HFA) 108 (90 Base) MCG/ACT inhaler INHALE 1 TO 2 PUFFS INTO THE LUNGS EVERY 6 HOURS AS NEEDED FOR WHEEZE OR SHORTNESS OF BREATH 6.7 g 2   atorvastatin (LIPITOR) 40 MG tablet TAKE 1 TABLET(40 MG) BY MOUTH DAILY 90 tablet 0   Budeson-Glycopyrrol-Formoterol (BREZTRI AEROSPHERE) 160-9-4.8 MCG/ACT AERO Inhale 2 puffs into the lungs in the  morning and at bedtime. 1 each 0   budesonide (PULMICORT) 0.5 MG/2ML nebulizer solution Take 2 mLs (0.5 mg total) by nebulization in the morning and at bedtime. 60 mL 5   cyclobenzaprine (FLEXERIL) 10 MG tablet Take 1/2 to 1 tablet up to 3 times daily prn muscle spasm 30 tablet 0   diclofenac Sodium (VOLTAREN ARTHRITIS PAIN) 1 % GEL Apply 2 g topically 4 (four) times daily. 100 g 1   hydrOXYzine (VISTARIL) 50 MG capsule Take 1 capsule (50 mg total) by mouth every 8 (eight) hours as needed. 30 capsule 0   ibuprofen (ADVIL) 600 MG tablet Take 1 tablet (600 mg total) by mouth every 8 (eight) hours as needed. 30 tablet 0   ibuprofen (ADVIL) 800 MG tablet Take 1 tablet (800 mg total) by mouth every 8 (eight) hours as needed. 30 tablet 2   ipratropium-albuterol (DUONEB) 0.5-2.5 (3) MG/3ML SOLN Take 3 mLs by nebulization in the morning, at noon, in the evening, and at bedtime. (Patient taking differently: Take 3 mLs by nebulization every 4 (four) hours as needed (Asthma).) 360 mL 2   metFORMIN (GLUCOPHAGE) 500 MG tablet Take 1 tablet (500 mg total) by mouth 2 (two) times daily with a meal. 60 tablet 5   metoprolol succinate (TOPROL-XL) 25 MG 24 hr tablet TAKE 1 TABLET(25 MG) BY MOUTH DAILY 30 tablet 0   mometasone-formoterol (DULERA) 200-5 MCG/ACT AERO Take 2 puffs first thing in am and then another 2 puffs about 12 hours later. 1 each 11  montelukast (SINGULAIR) 10 MG tablet Take 1 tablet (10 mg total) by mouth at bedtime. 30 tablet 11   ondansetron (ZOFRAN-ODT) 4 MG disintegrating tablet Take 1 tablet (4 mg total) by mouth every 8 (eight) hours as needed for nausea or vomiting. 20 tablet 0   pantoprazole (PROTONIX) 40 MG tablet TAKE 1 TABLET(40 MG) BY MOUTH DAILY 90 tablet 1   predniSONE (DELTASONE) 20 MG tablet 3,3,3,2,2,2,1,1,1 take each days dose in the morning with food 18 tablet 0   triamcinolone cream (KENALOG) 0.1 % Apply 1 Application topically 2 (two) times daily. 80 g 0   zolpidem (AMBIEN CR)  12.5 MG CR tablet Take 1 tablet (12.5 mg total) by mouth at bedtime. 30 tablet 1   No current facility-administered medications on file prior to visit.    Allergies  Allergen Reactions   Bee Venom Anaphylaxis   Penicillins Nausea And Vomiting and Other (See Comments)    Intolerance     Social History   Socioeconomic History   Marital status: Married    Spouse name: Not on file   Number of children: 0   Years of education: Not on file   Highest education level: Not on file  Occupational History   Occupation: Firehouse Paramedic  Tobacco Use   Smoking status: Never   Smokeless tobacco: Never  Vaping Use   Vaping Use: Never used  Substance and Sexual Activity   Alcohol use: No   Drug use: No   Sexual activity: Not Currently    Birth control/protection: None  Other Topics Concern   Not on file  Social History Narrative   Not on file   Social Determinants of Health   Financial Resource Strain: Not on file  Food Insecurity: Not on file  Transportation Needs: Not on file  Physical Activity: Not on file  Stress: Not on file  Social Connections: Not on file  Intimate Partner Violence: Not on file    Family History  Problem Relation Age of Onset   Heart attack Mother    Hypertension Mother    Breast cancer Mother    Polycystic ovary syndrome Mother    Heart disease Father    Hypertension Father    Liver cancer Father     Past Surgical History:  Procedure Laterality Date   CHOLECYSTECTOMY     DILITATION & CURRETTAGE/HYSTROSCOPY WITH NOVASURE ABLATION N/A 03/05/2022   Procedure: DILATATION & CURETTAGE/HYSTEROSCOPY WITH NOVASURE ABLATION;  Surgeon: Lavonia Drafts, MD;  Location: [facility];  Service: Gynecology;  Laterality: N/A;   OPEN REDUCTION INTERNAL FIXATION (ORIF) DISTAL RADIAL FRACTURE Right 05/15/2021   Procedure: OPEN REDUCTION INTERNAL FIXATION (ORIF)RIGHT  DISTAL RADIAL FRACTURE;  Surgeon: Meredith Pel, MD;  Location: [facility];  Service:  Orthopedics;  Laterality: Right;   TYMPANOSTOMY TUBE PLACEMENT     as a baby    ROS: Review of Systems Negative except as stated above  PHYSICAL EXAM: There were no vitals taken for this visit.  Physical Exam  {female adult master:310786} {female adult master:310785}     Latest Ref Rng & Units 11/14/2022    3:16 PM 03/05/2022    9:27 AM 11/13/2021    9:49 AM  CMP  Glucose 70 - 99 mg/dL 84  90  96   BUN 6 - 20 mg/dL '11  8  11   '$ Creatinine 0.57 - 1.00 mg/dL 0.95  0.80  0.80   Sodium 134 - 144 mmol/L 140  137  137   Potassium 3.5 -  5.2 mmol/L 4.6  4.1  3.8   Chloride 96 - 106 mmol/L 102  105  107   CO2 20 - 29 mmol/L '21  23  22   '$ Calcium 8.7 - 10.2 mg/dL 10.0  9.1  8.5    Lipid Panel     Component Value Date/Time   CHOL 211 (H) 08/14/2021 1724   TRIG 188 (H) 08/14/2021 1724   HDL 38 (L) 08/14/2021 1724   CHOLHDL 5.6 (H) 08/14/2021 1724   LDLCALC 139 (H) 08/14/2021 1724    CBC    Component Value Date/Time   WBC 11.2 (H) 11/14/2022 1516   WBC 11.0 (H) 03/05/2022 0927   RBC 5.26 11/14/2022 1516   RBC 4.90 03/05/2022 0927   HGB 13.0 11/14/2022 1516   HCT 41.7 11/14/2022 1516   PLT 493 (H) 11/14/2022 1516   MCV 79 11/14/2022 1516   MCH 24.7 (L) 11/14/2022 1516   MCH 25.1 (L) 03/05/2022 0927   MCHC 31.2 (L) 11/14/2022 1516   MCHC 31.2 03/05/2022 0927   RDW 15.6 (H) 11/14/2022 1516   LYMPHSABS 2.3 11/14/2022 1516   MONOABS 0.7 11/13/2021 0949   EOSABS 0.2 11/14/2022 1516   BASOSABS 0.1 11/14/2022 1516    ASSESSMENT AND PLAN:  There are no diagnoses linked to this encounter.   Patient was given the opportunity to ask questions.  Patient verbalized understanding of the plan and was able to repeat key elements of the plan. Patient was given clear instructions to go to Emergency Department or return to medical center if symptoms don't improve, worsen, or new problems develop.The patient verbalized understanding.   No orders of the defined types were placed in  this encounter.    Requested Prescriptions    No prescriptions requested or ordered in this encounter    No follow-ups on file.  Camillia Herter, NP

## 2023-01-24 NOTE — Progress Notes (Deleted)
Patient ID: Hli Rent, female    DOB: 1991/05/14  MRN: [id number]  CC: Acid Reflux Follow-Up  Subjective: Tina Manning is a 32 y.o. female who presents for acid reflux follow-up.  Her concerns today include:  Patient message: been having troubles where everytime I eat I'm throwing up about half hour later it's messing with my sleep I'm waking up nauseated I have stomach ulcers and been taking my meds thinking that's what's wrong but it's been going on a week or so  Cards - tachycardia, Metoprolol    Patient Active Problem List   Diagnosis Date Noted   Carpal tunnel syndrome, right upper limb 09/10/2022   CAP (community acquired pneumonia) 08/30/2022   Abnormal uterine bleeding (AUB) 12/15/2021   Hirsutism 12/15/2021   Snoring 12/05/2021   Insomnia 11/13/2021   Sciatic nerve pain, right 10/31/2021   Acute right-sided low back pain with right-sided sciatica 10/31/2021   ASCUS with positive high risk HPV cervical 09/13/2021   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 20 tablet 2   albuterol (VENTOLIN HFA) 108 (90 Base) MCG/ACT inhaler INHALE 1 TO 2 PUFFS INTO THE LUNGS EVERY 6 HOURS AS NEEDED FOR WHEEZE OR SHORTNESS OF BREATH 6.7 g 2   atorvastatin (LIPITOR) 40 MG tablet TAKE 1 TABLET(40 MG) BY MOUTH DAILY 90 tablet 0   Budeson-Glycopyrrol-Formoterol (BREZTRI AEROSPHERE) 160-9-4.8 MCG/ACT AERO Inhale 2 puffs into the lungs in the  morning and at bedtime. 1 each 0   budesonide (PULMICORT) 0.5 MG/2ML nebulizer solution Take 2 mLs (0.5 mg total) by nebulization in the morning and at bedtime. 60 mL 5   cyclobenzaprine (FLEXERIL) 10 MG tablet Take 1/2 to 1 tablet up to 3 times daily prn muscle spasm 30 tablet 0   diclofenac Sodium (VOLTAREN ARTHRITIS PAIN) 1 % GEL Apply 2 g topically 4 (four) times daily. 100 g 1   hydrOXYzine (VISTARIL) 50 MG capsule Take 1 capsule (50 mg total) by mouth every 8 (eight) hours as needed. 30 capsule 0   ibuprofen (ADVIL) 600 MG tablet Take 1 tablet (600 mg total) by mouth every 8 (eight) hours as needed. 30 tablet 0   ibuprofen (ADVIL) 800 MG tablet Take 1 tablet (800 mg total) by mouth every 8 (eight) hours as needed. 30 tablet 2   ipratropium-albuterol (DUONEB) 0.5-2.5 (3) MG/3ML SOLN Take 3 mLs by nebulization in the morning, at noon, in the evening, and at bedtime. (Patient taking differently: Take 3 mLs by nebulization every 4 (four) hours as needed (Asthma).) 360 mL 2   metFORMIN (GLUCOPHAGE) 500 MG tablet Take 1 tablet (500 mg total) by mouth 2 (two) times daily with a meal. 60 tablet 5   metoprolol succinate (TOPROL-XL) 25 MG 24 hr tablet TAKE 1 TABLET(25 MG) BY MOUTH DAILY 30 tablet 0   mometasone-formoterol (DULERA) 200-5 MCG/ACT AERO Take 2 puffs first thing in am and then another 2 puffs about 12 hours later. 1 each 11  montelukast (SINGULAIR) 10 MG tablet Take 1 tablet (10 mg total) by mouth at bedtime. 30 tablet 11   ondansetron (ZOFRAN-ODT) 4 MG disintegrating tablet Take 1 tablet (4 mg total) by mouth every 8 (eight) hours as needed for nausea or vomiting. 20 tablet 0   pantoprazole (PROTONIX) 40 MG tablet TAKE 1 TABLET(40 MG) BY MOUTH DAILY 90 tablet 1   predniSONE (DELTASONE) 20 MG tablet 3,3,3,2,2,2,1,1,1 take each days dose in the morning with food 18 tablet 0   triamcinolone cream (KENALOG) 0.1 % Apply 1 Application topically 2 (two) times daily. 80 g 0   zolpidem (AMBIEN CR)  12.5 MG CR tablet Take 1 tablet (12.5 mg total) by mouth at bedtime. 30 tablet 1   No current facility-administered medications on file prior to visit.    Allergies  Allergen Reactions   Bee Venom Anaphylaxis   Penicillins Nausea And Vomiting and Other (See Comments)    Intolerance     Social History   Socioeconomic History   Marital status: Married    Spouse name: Not on file   Number of children: 0   Years of education: Not on file   Highest education level: Not on file  Occupational History   Occupation: Firehouse Paramedic  Tobacco Use   Smoking status: Never   Smokeless tobacco: Never  Vaping Use   Vaping Use: Never used  Substance and Sexual Activity   Alcohol use: No   Drug use: No   Sexual activity: Not Currently    Birth control/protection: None  Other Topics Concern   Not on file  Social History Narrative   Not on file   Social Determinants of Health   Financial Resource Strain: Not on file  Food Insecurity: Not on file  Transportation Needs: Not on file  Physical Activity: Not on file  Stress: Not on file  Social Connections: Not on file  Intimate Partner Violence: Not on file    Family History  Problem Relation Age of Onset   Heart attack Mother    Hypertension Mother    Breast cancer Mother    Polycystic ovary syndrome Mother    Heart disease Father    Hypertension Father    Liver cancer Father     Past Surgical History:  Procedure Laterality Date   CHOLECYSTECTOMY     DILITATION & CURRETTAGE/HYSTROSCOPY WITH NOVASURE ABLATION N/A 03/05/2022   Procedure: DILATATION & CURETTAGE/HYSTEROSCOPY WITH NOVASURE ABLATION;  Surgeon: Lavonia Drafts, MD;  Location: [facility];  Service: Gynecology;  Laterality: N/A;   OPEN REDUCTION INTERNAL FIXATION (ORIF) DISTAL RADIAL FRACTURE Right 05/15/2021   Procedure: OPEN REDUCTION INTERNAL FIXATION (ORIF)RIGHT  DISTAL RADIAL FRACTURE;  Surgeon: Meredith Pel, MD;  Location: [facility];  Service:  Orthopedics;  Laterality: Right;   TYMPANOSTOMY TUBE PLACEMENT     as a baby    ROS: Review of Systems Negative except as stated above  PHYSICAL EXAM: There were no vitals taken for this visit.  Physical Exam  {female adult master:310786} {female adult master:310785}     Latest Ref Rng & Units 11/14/2022    3:16 PM 03/05/2022    9:27 AM 11/13/2021    9:49 AM  CMP  Glucose 70 - 99 mg/dL 84  90  96   BUN 6 - 20 mg/dL '11  8  11   '$ Creatinine 0.57 - 1.00 mg/dL 0.95  0.80  0.80   Sodium 134 - 144 mmol/L 140  137  137   Potassium 3.5 -  5.2 mmol/L 4.6  4.1  3.8   Chloride 96 - 106 mmol/L 102  105  107   CO2 20 - 29 mmol/L '21  23  22   '$ Calcium 8.7 - 10.2 mg/dL 10.0  9.1  8.5    Lipid Panel     Component Value Date/Time   CHOL 211 (H) 08/14/2021 1724   TRIG 188 (H) 08/14/2021 1724   HDL 38 (L) 08/14/2021 1724   CHOLHDL 5.6 (H) 08/14/2021 1724   LDLCALC 139 (H) 08/14/2021 1724    CBC    Component Value Date/Time   WBC 11.2 (H) 11/14/2022 1516   WBC 11.0 (H) 03/05/2022 0927   RBC 5.26 11/14/2022 1516   RBC 4.90 03/05/2022 0927   HGB 13.0 11/14/2022 1516   HCT 41.7 11/14/2022 1516   PLT 493 (H) 11/14/2022 1516   MCV 79 11/14/2022 1516   MCH 24.7 (L) 11/14/2022 1516   MCH 25.1 (L) 03/05/2022 0927   MCHC 31.2 (L) 11/14/2022 1516   MCHC 31.2 03/05/2022 0927   RDW 15.6 (H) 11/14/2022 1516   LYMPHSABS 2.3 11/14/2022 1516   MONOABS 0.7 11/13/2021 0949   EOSABS 0.2 11/14/2022 1516   BASOSABS 0.1 11/14/2022 1516    ASSESSMENT AND PLAN:  There are no diagnoses linked to this encounter.   Patient was given the opportunity to ask questions.  Patient verbalized understanding of the plan and was able to repeat key elements of the plan. Patient was given clear instructions to go to Emergency Department or return to medical center if symptoms don't improve, worsen, or new problems develop.The patient verbalized understanding.   No orders of the defined types were placed in  this encounter.    Requested Prescriptions    No prescriptions requested or ordered in this encounter    No follow-ups on file.  Camillia Herter, NP

## 2023-01-25 ENCOUNTER — Ambulatory Visit: Payer: BLUE CROSS/BLUE SHIELD | Admitting: Family

## 2023-01-25 ENCOUNTER — Ambulatory Visit: Payer: BLUE CROSS/BLUE SHIELD | Admitting: Physician Assistant

## 2023-01-25 DIAGNOSIS — R112 Nausea with vomiting, unspecified: Secondary | ICD-10-CM

## 2023-01-25 DIAGNOSIS — K219 Gastro-esophageal reflux disease without esophagitis: Secondary | ICD-10-CM

## 2023-01-31 ENCOUNTER — Telehealth: Payer: BLUE CROSS/BLUE SHIELD | Admitting: Physician Assistant

## 2023-01-31 DIAGNOSIS — H66009 Acute suppurative otitis media without spontaneous rupture of ear drum, unspecified ear: Secondary | ICD-10-CM

## 2023-01-31 MED ORDER — AZITHROMYCIN 250 MG PO TABS: ORAL_TABLET | ORAL | 0 refills | Status: AC

## 2023-01-31 NOTE — Progress Notes (Signed)
I have spent 5 minutes in review of e-visit questionnaire, review and updating patient chart, medical decision making and response to patient.   Tina Manning Cody Tiki Tucciarone, PA-C    

## 2023-01-31 NOTE — Progress Notes (Signed)
E-Visit for Ear Pain - Acute Otitis Media   We are sorry that you are not feeling well. Here is how we plan to help!  Based on what you have shared with me it looks like you have Acute Otitis Media.  Acute Otitis Media is an infection of the middle or "inner" ear. This type of infection can cause redness, inflammation, and fluid buildup behind the tympanic membrane (ear drum).  The usual symptoms include: Earache/Pain Fever Upper respiratory symptoms Lack of energy/Fatigue/Malaise Slight hearing loss gradually worsening- if the inner ear fills with fluid What causes middle ear infections? Most middle ear infections occur when an infection such as a cold, leads to a build-up of mucus in the middle ear and causes the Eustachian tube (a thin tube that runs from the middle ear to the back of the nose) to become swollen or blocked.   This means mucus can't drain away properly, making it easier for an infection to spread into the middle ear.  How middle ear infections are treated: Most ear infections clear up within three to five days and don't need any specific treatment. If necessary, tylenol or ibuprofen should be used to relieve pain and a high temperature.  If you develop a fever higher than 102, or any significantly worsening symptoms, this could indicate a more serious infection moving to the middle/inner and needs face to face evaluation in an office by a provider.   Antibiotics aren't routinely used to treat middle ear infections, although they may occasionally be prescribed if symptoms persist or are particularly severe. Given your presentation,   I have prescribed Azithromycin 250 mg two tablets by mouth on day 1, then 1 tablet by mouth daily until completed   Your symptoms should improve over the next 3 days and should resolve in about 7 days. Be sure to complete ALL of the prescription(s) given.  HOME CARE: Wash your hands frequently. If you are prescribed an ear drop, do not  place the tip of the bottle on your ear or touch it with your fingers. You can take Acetaminophen 650 mg every 4-6 hours as needed for pain.  If pain is severe or moderate, you can apply a heating pad (set on low) or hot water bottle (wrapped in a towel) to outer ear for 20 minutes.  This will also increase drainage.  GET HELP RIGHT AWAY IF: Fever is over 102.2 degrees. You develop progressive ear pain or hearing loss. Ear symptoms persist longer than 3 days after treatment.  MAKE SURE YOU: Understand these instructions. Will watch your condition. Will get help right away if you are not doing well or get worse.  Thank you for choosing an e-visit.  Your e-visit answers were reviewed by a board certified advanced clinical practitioner to complete your personal care plan. Depending upon the condition, your plan could have included both over the counter or prescription medications.  Please review your pharmacy choice. Make sure the pharmacy is open so you can pick up the prescription now. If there is a problem, you may contact your provider through MyChart messaging and have the prescription routed to another pharmacy.  Your safety is important to us. If you have drug allergies check your prescription carefully.   For the next 24 hours you can use MyChart to ask questions about today's visit, request a non-urgent call back, or ask for a work or school excuse. You will get an email with a survey after your eVisit asking about your   experience. We would appreciate your feedback. I hope that your e-visit has been valuable and will aid in your recovery.   

## 2023-02-04 ENCOUNTER — Telehealth: Payer: BLUE CROSS/BLUE SHIELD | Admitting: Physician Assistant

## 2023-02-04 DIAGNOSIS — H9203 Otalgia, bilateral: Secondary | ICD-10-CM

## 2023-02-04 DIAGNOSIS — H6993 Unspecified Eustachian tube disorder, bilateral: Secondary | ICD-10-CM

## 2023-02-05 NOTE — Progress Notes (Signed)
Because of continued and significant symptoms despite treatment on 2/8, I feel your condition warrants further evaluation and I recommend that you be seen in a face to face visit.   NOTE: There will be NO CHARGE for this eVisit   If you are having a true medical emergency please call 911.      For an urgent face to face visit, [facility] has eight urgent care centers for your convenience:   NEW!! [facility] [place] [facility] [place] Get Driving Directions [id number] [address] [place], [place] [facility] [place] [facility] [place] [facility] Get Driving Directions [id number] [place], [place] [address]   [facility]) Get Driving Directions [id number] [address], [place] [address]  [facility] [place] ([place]) Get Driving Directions [id number] [address] [place],  [place]  [address]  [facility] - at [facility] Get Driving Directions  [id number] [phone] [place],  [place] [address]   [facility] Get Driving Directions [id number] [place], [place], [place] [address]   [facility] Get Driving Directions  [id number] [address] [place], [place] [address]   [facility] [place] Get Driving Directions [id number] [address] [place], [place] [address]  Your MyChart E-visit questionnaire answers were reviewed by a board certified advanced clinical practitioner to complete your personal care plan based on your specific symptoms.  Thank you for using e-Visits.

## 2023-02-09 ENCOUNTER — Telehealth: Payer: BLUE CROSS/BLUE SHIELD | Admitting: Family Medicine

## 2023-02-09 DIAGNOSIS — H9203 Otalgia, bilateral: Secondary | ICD-10-CM

## 2023-02-09 NOTE — Progress Notes (Signed)
Because Ms. Tina Manning, I feel your condition warrants further evaluation and I recommend that you be seen in a face to face visit.  I spoke with her and told her to go to urgent care for further eval. DWB   NOTE: There will be NO CHARGE for this eVisit   If you are having a true medical emergency please call 911.      For an urgent face to face visit, [facility] has eight urgent care centers for your convenience:   NEW!! [facility] [place] [facility] [place] Get Driving Directions [id number] [address] [place], [place] [facility] [place] [facility] [place] Get Driving Directions [id number] [place], [place] [address]   [place] [facility]) Get Driving Directions [id number] [address] [place], [place] [address]  [facility] [place] ([place]) Get Driving Directions [id number] [address] [place],  [place]  [address]  [facility] [place] [facility] - at [facility] [place] Get Driving Directions  [phone] [place],  [place] [address]   [facility] Get Driving Directions [id number] [place], [place], [place] [address]   [facility] Get Driving Directions  [id number] [address] [place], [place] [address]   [facility] [place] Get Driving Directions [id number] [address], [place], [place] [address]  Your MyChart E-visit questionnaire answers were reviewed by a board certified advanced clinical practitioner to complete your personal care plan based on your specific symptoms.  Thank you for using e-Visits.   Ms. Tina Manning

## 2023-02-10 ENCOUNTER — Other Ambulatory Visit: Payer: Self-pay

## 2023-02-10 ENCOUNTER — Ambulatory Visit
Admission: EM | Admit: 2023-02-10 | Discharge: 2023-02-10 | Disposition: A | Discharge status: Home / Self Care | Payer: BLUE CROSS/BLUE SHIELD | Attending: Emergency Medicine | Admitting: Emergency Medicine

## 2023-02-10 ENCOUNTER — Encounter: Payer: Self-pay | Admitting: Emergency Medicine

## 2023-02-10 DIAGNOSIS — H9203 Otalgia, bilateral: Secondary | ICD-10-CM | POA: Diagnosis not present

## 2023-02-10 MED ORDER — KETOROLAC TROMETHAMINE 30 MG/ML IJ SOLN
30.0000 mg | Freq: Once | INTRAMUSCULAR | Status: AC
  Administered 2023-02-10: 30 mg via INTRAMUSCULAR

## 2023-02-10 MED ORDER — PREDNISONE 20 MG PO TABS: 40.0000 mg | ORAL_TABLET | Freq: Every day | ORAL | 0 refills | Status: DC

## 2023-02-10 MED ORDER — CEFDINIR 300 MG PO CAPS: 300.0000 mg | ORAL_CAPSULE | Freq: Two times a day (BID) | ORAL | 0 refills | Status: AC

## 2023-02-10 NOTE — Discharge Instructions (Signed)
On exam there is no redness within the ears that would indicate a sign of infection however.  You have had symptoms for 3 weeks we will attempt additional antibiotics  Begin cefdinir every morning and every evening for 10 days, until you begin to see improvement in about 48 hours  You have been given an injection of Toradol here in the office today to help minimize your pain, ideally you will see improvement in about 30 minutes, you may take Tylenol and Motrin as needed while at home, may also purchase over-the-counter lidocaine drops specifically for the ears to see if that we will provide you additional comfort  Starting tomorrow begin prednisone every morning with food for 5 days to help with pain and to reduce any inflammation and with your symptoms  Continue the daily use of Flonase which will help to reduce sinus congestion  Begin use of Mucinex and an allergy medicine such as Claritin or Zyrtec to further help reduce sinus congestion which may be aiding to your symptoms  If your symptoms continue to persist or worsen you may follow-up with ear nose and throat which are the specialist information is listed on front page

## 2023-02-10 NOTE — ED Triage Notes (Signed)
Pt here for bilateral ear pain x 3 days  

## 2023-02-10 NOTE — ED Provider Notes (Addendum)
[facility]    CSN: [id number] Arrival date & time: 02/10/23  1203      History   Chief Complaint Chief Complaint  Patient presents with   Otalgia    HPI Tina Manning is a 32 y.o. female.   Patient presents for evaluation of bilateral ear pain present for 3 weeks.  Has completed 3 ED visits, prescribed azithromycin for infection, no improvement seen.  Additionally has attempted use of heating pad, ibuprofen and attempted to clean ears with no improvement.  Been experiencing congestion without rhinorrhea.  Denies fever, sore throat, ear drainage, decreased hearing, fullness or pruritus.    Past Medical History:  Diagnosis Date   ADHD (attention deficit hyperactivity disorder)    Anemia    Anxiety    Asthma    Complication of anesthesia    COVID 01/2022   out of work for a week   Endometriosis    GERD (gastroesophageal reflux disease)    Heart rate fast    takes Metoprolol   History of kidney stones    Pneumonia    PONV (postoperative nausea and vomiting)    Pre-diabetes    Tachycardia     Patient Active Problem List   Diagnosis Date Noted   Carpal tunnel syndrome, right upper limb 09/10/2022   CAP (community acquired pneumonia) 08/30/2022   Abnormal uterine bleeding (AUB) 12/15/2021   Hirsutism 12/15/2021   Snoring 12/05/2021   Insomnia 11/13/2021   Sciatic nerve pain, right 10/31/2021   Acute right-sided low back pain with right-sided sciatica 10/31/2021   ASCUS with positive high risk HPV cervical 09/13/2021   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   Hyperlipidemia 08/15/2021   Prediabetes 08/15/2021   Morbid obesity due to excess calories ([place]) 08/15/2021   Closed fracture of lower end of right radius with routine healing    Anxiety and depression 08/09/2011   Endometriosis 03/27/2009   Tachycardia 11/27/1998   Severe asthma with acute exacerbation 04/24/1994    Past Surgical History:  Procedure Laterality Date   CHOLECYSTECTOMY     DILITATION  & CURRETTAGE/HYSTROSCOPY WITH NOVASURE ABLATION N/A 03/05/2022   Procedure: DILATATION & CURETTAGE/HYSTEROSCOPY WITH NOVASURE ABLATION;  Surgeon: Lavonia Drafts, MD;  Location: [place];  Service: Gynecology;  Laterality: N/A;   OPEN REDUCTION INTERNAL FIXATION (ORIF) DISTAL RADIAL FRACTURE Right 05/15/2021   Procedure: OPEN REDUCTION INTERNAL FIXATION (ORIF)RIGHT  DISTAL RADIAL FRACTURE;  Surgeon: Meredith Pel, MD;  Location: [place];  Service: Orthopedics;  Laterality: Right;   TYMPANOSTOMY TUBE PLACEMENT     as a baby    OB History     Gravida  0   Para  0   Term  0   Preterm  0   AB  0   Living  0      SAB  0   IAB  0   Ectopic  0   Multiple  0   Live Births  0            Home Medications    Prior to Admission medications   Medication Sig Start Date End Date Taking? Authorizing Provider  ondansetron (ZOFRAN-ODT) 4 MG disintegrating tablet Take 1 tablet (4 mg total) by mouth every 8 (eight) hours as needed for nausea or vomiting. 01/11/23   Aundra Dubin, PA-C  albuterol (VENTOLIN HFA) 108 (90 Base) MCG/ACT inhaler INHALE 1 TO 2 PUFFS INTO THE LUNGS EVERY 6 HOURS AS NEEDED FOR WHEEZE OR SHORTNESS OF BREATH 01/07/23   Olalere, Cicero Duck  A, MD  atorvastatin (LIPITOR) 40 MG tablet TAKE 1 TABLET(40 MG) BY MOUTH DAILY 10/27/21   Camillia Herter, NP  Budeson-Glycopyrrol-Formoterol (BREZTRI AEROSPHERE) 160-9-4.8 MCG/ACT AERO Inhale 2 puffs into the lungs in the morning and at bedtime. 08/30/22   Cobb, Karie Schwalbe, NP  budesonide (PULMICORT) 0.5 MG/2ML nebulizer solution Take 2 mLs (0.5 mg total) by nebulization in the morning and at bedtime. 08/30/22   Cobb, Karie Schwalbe, NP  cyclobenzaprine (FLEXERIL) 10 MG tablet Take 1/2 to 1 tablet up to 3 times daily prn muscle spasm 01/18/23   Callie Fielding, MD  diclofenac Sodium (VOLTAREN ARTHRITIS PAIN) 1 % GEL Apply 2 g topically 4 (four) times daily. 11/14/22   Argentina Donovan, PA-C  hydrOXYzine (VISTARIL) 50 MG capsule  Take 1 capsule (50 mg total) by mouth every 8 (eight) hours as needed. 09/29/21   Camillia Herter, NP  ibuprofen (ADVIL) 600 MG tablet Take 1 tablet (600 mg total) by mouth every 8 (eight) hours as needed. 03/20/22   Camillia Herter, NP  ibuprofen (ADVIL) 800 MG tablet Take 1 tablet (800 mg total) by mouth every 8 (eight) hours as needed. 01/09/23   Leandrew Koyanagi, MD  ipratropium-albuterol (DUONEB) 0.5-2.5 (3) MG/3ML SOLN Take 3 mLs by nebulization in the morning, at noon, in the evening, and at bedtime. Patient taking differently: Take 3 mLs by nebulization every 4 (four) hours as needed (Asthma). 01/04/22   Camillia Herter, NP  metFORMIN (GLUCOPHAGE) 500 MG tablet Take 1 tablet (500 mg total) by mouth 2 (two) times daily with a meal. 12/21/21   Truett Mainland, DO  metoprolol succinate (TOPROL-XL) 25 MG 24 hr tablet TAKE 1 TABLET(25 MG) BY MOUTH DAILY 01/07/23   Camillia Herter, NP  mometasone-formoterol (DULERA) 200-5 MCG/ACT AERO Take 2 puffs first thing in am and then another 2 puffs about 12 hours later. 08/15/21   Tanda Rockers, MD  montelukast (SINGULAIR) 10 MG tablet Take 1 tablet (10 mg total) by mouth at bedtime. 09/12/21   Martyn Ehrich, NP  ondansetron (ZOFRAN-ODT) 4 MG disintegrating tablet Take 1 tablet (4 mg total) by mouth every 8 (eight) hours as needed for nausea or vomiting. 01/10/23   Brunetta Jeans, PA-C  pantoprazole (PROTONIX) 40 MG tablet TAKE 1 TABLET(40 MG) BY MOUTH DAILY 11/21/22   Camillia Herter, NP  predniSONE (DELTASONE) 20 MG tablet 3,3,3,2,2,2,1,1,1 take each days dose in the morning with food 12/03/22   Freeman Caldron M, PA-C  triamcinolone cream (KENALOG) 0.1 % Apply 1 Application topically 2 (two) times daily. 10/18/22   Mar Daring, PA-C  zolpidem (AMBIEN CR) 12.5 MG CR tablet Take 1 tablet (12.5 mg total) by mouth at bedtime. 01/21/23   Laurin Coder, MD    Family History Family History  Problem Relation Age of Onset   Heart attack Mother     Hypertension Mother    Breast cancer Mother    Polycystic ovary syndrome Mother    Heart disease Father    Hypertension Father    Liver cancer Father     Social History Social History   Tobacco Use   Smoking status: Never   Smokeless tobacco: Never  Vaping Use   Vaping Use: Never used  Substance Use Topics   Alcohol use: No   Drug use: No     Allergies   Bee venom and Penicillins   Review of Systems Review of Systems  HENT:  Positive for  ear pain.      Physical Exam Triage Vital Signs ED Triage Vitals [02/10/23 1223]  Enc Vitals Group     BP (!) 168/106     Pulse Rate (!) 128     Resp 18     Temp 98.4 F (36.9 C)     Temp Source Oral     SpO2 96 %     Weight      Height      Head Circumference      Peak Flow      Pain Score 5     Pain Loc      Pain Edu?      Excl. in [place]?    No data found.  Updated Vital Signs BP (!) 168/106 (BP Location: Left Arm)   Pulse (!) 128   Temp 98.4 F (36.9 C) (Oral)   Resp 18   SpO2 96%   Visual Acuity Right Eye Distance:   Left Eye Distance:   Bilateral Distance:    Right Eye Near:   Left Eye Near:    Bilateral Near:     Physical Exam Constitutional:      Appearance: She is ill-appearing.  HENT:     Head: Normocephalic.     Right Ear: Tympanic membrane, ear canal and external ear normal.     Left Ear: Tympanic membrane, ear canal and external ear normal.  Eyes:     Extraocular Movements: Extraocular movements intact.  Pulmonary:     Effort: Pulmonary effort is normal.  Neurological:     Mental Status: She is alert and oriented to person, place, and time. Mental status is at baseline.      UC Treatments / Results  Labs (all labs ordered are listed, but only abnormal results are displayed) Labs Reviewed - No data to display  EKG   Radiology No results found.  Procedures Procedures (including critical care time)  Medications Ordered in UC Medications - No data to display  Initial  Impression / Assessment and Plan / UC Course  I have reviewed the triage vital signs and the nursing notes.  Pertinent labs & imaging results that were available during my care of the patient were reviewed by me and considered in my medical decision making (see chart for details).  Otalgia of both ears  No abnormalities noted on exam, discussed with patient however she has had persistent pain for 3 weeks and therefore will provide additional bacterial coverage, cefdinir prescribed as well as prednisone to reduce any swelling and help with pain, recommended use of Mucinex, antihistamines and continue use of Flonase for management of sinus congestion which could be aiding to symptoms, advise follow-up with ear nose and throat if symptoms continue to persist or worsen Final Clinical Impressions(s) / UC Diagnoses   Final diagnoses:  None   Discharge Instructions   None    ED Prescriptions   None    PDMP not reviewed this encounter.   Hans Eden, NP 02/10/23 1238    Hans Eden, NP 02/10/23 1239

## 2023-02-12 NOTE — Progress Notes (Signed)
Patient ID: Tina Manning, female    DOB: 1991/01/13  MRN: [id number]  CC: Urgent Care Follow-Up  Subjective: Tina Manning is a 32 y.o. female who presents for Urgent Care follow-up.   Her concerns today include:  02/10/2023 [facility]) per NP note: Initial Impression / Assessment and Plan / UC Course  I have reviewed the triage vital signs and the nursing notes.   Pertinent labs & imaging results that were available during my care of the patient were reviewed by me and considered in my medical decision making (see chart for details).   Otalgia of both ears   No abnormalities noted on exam, discussed with patient however she has had persistent pain for 3 weeks and therefore will provide additional bacterial coverage, cefdinir prescribed as well as prednisone to reduce any swelling and help with pain, recommended use of Mucinex, antihistamines and continue use of Flonase for management of sinus congestion which could be aiding to symptoms, advise follow-up with ear nose and throat if symptoms continue to persist or worsen  Today's visit 02/19/2023: Bilateral ear pain persisting. Left > right. States left ear sounds like popping like she is in an airplane. She denies red flag symptoms such as decreased hearing and imbalance. Reports she is taking medications as prescribed. She has not established with ENT as of present. No further issues/concerns today.   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 20 tablet 2   albuterol (VENTOLIN HFA) 108 (90 Base) MCG/ACT inhaler INHALE 1 TO 2 PUFFS INTO THE LUNGS EVERY 6 HOURS AS NEEDED FOR WHEEZE OR SHORTNESS OF BREATH 6.7 g 2   atorvastatin (LIPITOR) 40 MG tablet TAKE 1 TABLET(40 MG) BY MOUTH DAILY 90 tablet 0   Budeson-Glycopyrrol-Formoterol (BREZTRI AEROSPHERE) 160-9-4.8 MCG/ACT AERO Inhale 2 puffs into the lungs in the morning and at bedtime. 1 each 0   budesonide (PULMICORT) 0.5 MG/2ML nebulizer solution Take 2 mLs (0.5 mg total) by nebulization in the morning and at bedtime. 60 mL 5   cefdinir (OMNICEF) 300 MG capsule Take 1 capsule (300 mg total) by mouth 2 (two) times daily for 10 days. 20 capsule 0   cyclobenzaprine (FLEXERIL) 10 MG tablet Take 1/2 to 1 tablet up to 3 times daily prn muscle spasm 30 tablet 0   diclofenac Sodium (VOLTAREN ARTHRITIS PAIN) 1 % GEL Apply 2 g topically 4 (  four) times daily. 100 g 1   hydrOXYzine (VISTARIL) 50 MG capsule Take 1 capsule (50 mg total) by mouth every 8 (eight) hours as needed. 30 capsule 0   ibuprofen (ADVIL) 600 MG tablet Take 1 tablet (600 mg total) by mouth every 8 (eight) hours as needed. 30 tablet 0   ibuprofen (ADVIL) 800 MG tablet Take 1 tablet (800 mg total) by mouth every 8 (eight) hours as needed. 30 tablet 2   ipratropium-albuterol (DUONEB) 0.5-2.5 (3) MG/3ML SOLN  Take 3 mLs by nebulization in the morning, at noon, in the evening, and at bedtime. (Patient taking differently: Take 3 mLs by nebulization every 4 (four) hours as needed (Asthma).) 360 mL 2   metFORMIN (GLUCOPHAGE) 500 MG tablet Take 1 tablet (500 mg total) by mouth 2 (two) times daily with a meal. 60 tablet 5   metoprolol succinate (TOPROL-XL) 25 MG 24 hr tablet TAKE 1 TABLET(25 MG) BY MOUTH DAILY 90 tablet 0   mometasone-formoterol (DULERA) 200-5 MCG/ACT AERO Take 2 puffs first thing in am and then another 2 puffs about 12 hours later. 1 each 11   montelukast (SINGULAIR) 10 MG tablet Take 1 tablet (10 mg total) by mouth at bedtime. 30 tablet 11   neomycin-polymyxin-hydrocortisone (CORTISPORIN) 3.5-10000-1 ophthalmic suspension Place 3 drops into the right eye 4 (four) times daily. 7.5 mL 0   ondansetron (ZOFRAN-ODT) 4 MG disintegrating tablet Take 1 tablet (4 mg total) by mouth every 8 (eight) hours as needed for nausea or vomiting. 20 tablet 0   pantoprazole (PROTONIX) 40 MG tablet TAKE 1 TABLET(40 MG) BY MOUTH DAILY 90 tablet 1   predniSONE (DELTASONE) 20 MG tablet Take 2 tablets (40 mg total) by mouth daily. 10 tablet 0   triamcinolone cream (KENALOG) 0.1 % Apply 1 Application topically 2 (two) times daily. 80 g 0   zolpidem (AMBIEN CR) 12.5 MG CR tablet Take 1 tablet (12.5 mg total) by mouth at bedtime. 30 tablet 1   No current facility-administered medications on file prior to visit.    Allergies  Allergen Reactions   Bee Venom Anaphylaxis   Penicillins Nausea And Vomiting and Other (See Comments)    Intolerance     Social History   Socioeconomic History   Marital status: Married    Spouse name: Not on file   Number of children: 0   Years of education: Not on file   Highest education level: Not on file  Occupational History   Occupation: [employer]house [employer]  Tobacco Use   Smoking status: Never   Smokeless tobacco: Never  Vaping Use   Vaping Use: Never used  Substance  and Sexual Activity   Alcohol use: No   Drug use: No   Sexual activity: Not Currently    Birth control/protection: None  Other Topics Concern   Not on file  Social History Narrative   Not on file   Social Determinants of Health   Financial Resource Strain: Not on file  Food Insecurity: Not on file  Transportation Needs: Not on file  Physical Activity: Not on file  Stress: Not on file  Social Connections: Not on file  Intimate Partner Violence: Not on file    Family History  Problem Relation Age of Onset   Heart attack Mother    Hypertension Mother    Breast cancer Mother    Polycystic ovary syndrome Mother    Heart disease Father    Hypertension Father    Liver cancer Father  Past Surgical History:  Procedure Laterality Date   CHOLECYSTECTOMY     DILITATION & CURRETTAGE/HYSTROSCOPY WITH NOVASURE ABLATION N/A 03/05/2022   Procedure: DILATATION & CURETTAGE/HYSTEROSCOPY WITH NOVASURE ABLATION;  Surgeon: Lavonia Drafts, MD;  Location: [place];  Service: Gynecology;  Laterality: N/A;   OPEN REDUCTION INTERNAL FIXATION (ORIF) DISTAL RADIAL FRACTURE Right 05/15/2021   Procedure: OPEN REDUCTION INTERNAL FIXATION (ORIF)RIGHT  DISTAL RADIAL FRACTURE;  Surgeon: Meredith Pel, MD;  Location: [place];  Service: Orthopedics;  Laterality: Right;   TYMPANOSTOMY TUBE PLACEMENT     as a baby    ROS: Review of Systems Negative except as stated above  PHYSICAL EXAM: BP 124/81 (BP Location: Left Arm, Patient Position: Sitting, Cuff Size: Large)   Pulse (!) 128   Temp 98.2 F (36.8 C)   Resp 16   Ht '5\' 5"'$  (1.651 m)   Wt (!) 310 lb (140.6 kg)   SpO2 95%   BMI 51.59 kg/m   Physical Exam HENT:     Head: Normocephalic and atraumatic.     Right Ear: Tympanic membrane, ear canal and external ear normal.     Left Ear: Tympanic membrane, ear canal and external ear normal.     Nose: Nose normal.     Mouth/Throat:     Mouth: Mucous membranes are moist.     Pharynx:  Oropharynx is clear.  Eyes:     Extraocular Movements: Extraocular movements intact.     Conjunctiva/sclera: Conjunctivae normal.     Pupils: Pupils are equal, round, and reactive to light.  Cardiovascular:     Rate and Rhythm: Tachycardia present.     Pulses: Normal pulses.     Heart sounds: Normal heart sounds.  Pulmonary:     Effort: Pulmonary effort is normal.     Breath sounds: Normal breath sounds.  Musculoskeletal:     Cervical back: Normal range of motion and neck supple.  Neurological:     General: No focal deficit present.     Mental Status: She is alert and oriented to person, place, and time.  Psychiatric:        Mood and Affect: Mood normal.        Behavior: Behavior normal.     ASSESSMENT AND PLAN: 1. Otalgia of both ears - Referral to ENT for further evaluation/management. During the interim follow-up with primary provider as scheduled.  - Ambulatory referral to ENT   Patient was given the opportunity to ask questions.  Patient verbalized understanding of the plan and was able to repeat key elements of the plan. Patient was given clear instructions to go to Emergency Department or return to medical center if symptoms don't improve, worsen, or new problems develop.The patient verbalized understanding.   Orders Placed This Encounter  Procedures   Ambulatory referral to ENT   Follow-up with primary provider as scheduled.  Camillia Herter, NP

## 2023-02-15 ENCOUNTER — Telehealth: Payer: BLUE CROSS/BLUE SHIELD | Admitting: Nurse Practitioner

## 2023-02-15 DIAGNOSIS — H00019 Hordeolum externum unspecified eye, unspecified eyelid: Secondary | ICD-10-CM

## 2023-02-16 MED ORDER — NEOMYCIN-POLYMYXIN-HC 3.5-10000-1 OP SUSP: 3.0000 [drp] | Freq: Four times a day (QID) | OPHTHALMIC | 0 refills | Status: DC

## 2023-02-16 NOTE — Progress Notes (Signed)
  E-Visit for Stye   We are sorry that you are not feeling well. Here is how we plan to help!  Based on what you have shared with me it looks like you have a stye.  A stye is an inflammation of the eyelid.  It is often a red, painful lump near the edge of the eyelid that may look like a boil or a pimple.  A stye develops when an infection occurs at the base of an eyelash.   We have made appropriate suggestions for you based upon your presentation: Your symptoms may indicate an infection of the sclera.  The use of anti-inflammatory and antibiotic eye drops for a week will help resolve this condition.  I have sent in neomycin-polymyxin HC opthalmic suspension, two to three drops in the affected eye every 4 hours.  If your symptoms do not improve over the next two to three days you should be seen in your doctor's office.  HOME CARE:  Wash your hands often! Let the stye open on its own. Don't squeeze or open it. Don't rub your eyes. This can irritate your eyes and let in bacteria.  If you need to touch your eyes, wash your hands first. Don't wear eye makeup or contact lenses until the area has healed.  GET HELP RIGHT AWAY IF:  Your symptoms do not improve. You develop blurred or loss of vision. Your symptoms worsen (increased discharge, pain or redness).   Thank you for choosing an e-visit.  Your e-visit answers were reviewed by a board certified advanced clinical practitioner to complete your personal care plan. Depending upon the condition, your plan could have included both over the counter or prescription medications.  Please review your pharmacy choice. Make sure the pharmacy is open so you can pick up prescription now. If there is a problem, you may contact your provider through [employer] and have the prescription routed to another pharmacy.  Your safety is important to [place]. If you have drug allergies check your prescription carefully.   For the next 24 hours you can use  MyChart to ask questions about today's visit, request a non-urgent call back, or ask for a work or school excuse. You will get an email in the next two days asking about your experience. I hope that your e-visit has been valuable and will speed your recovery.  Mary-Margaret Hassell Done, FNP   5-10 minutes spent reviewing and documenting in chart.

## 2023-02-18 ENCOUNTER — Other Ambulatory Visit: Payer: Self-pay | Admitting: Family

## 2023-02-18 DIAGNOSIS — R000 Tachycardia, unspecified: Secondary | ICD-10-CM

## 2023-02-18 MED ORDER — METOPROLOL SUCCINATE ER 25 MG PO TB24: ORAL_TABLET | ORAL | 0 refills | Status: DC

## 2023-02-19 ENCOUNTER — Ambulatory Visit (INDEPENDENT_AMBULATORY_CARE_PROVIDER_SITE_OTHER): Payer: BLUE CROSS/BLUE SHIELD | Admitting: Family

## 2023-02-19 VITALS — BP 124/81 | HR 128 | Temp 98.2°F | Resp 16 | Ht 65.0 in | Wt 310.0 lb

## 2023-02-19 DIAGNOSIS — H9203 Otalgia, bilateral: Secondary | ICD-10-CM

## 2023-02-19 DIAGNOSIS — Z6841 Body Mass Index (BMI) 40.0 and over, adult: Secondary | ICD-10-CM | POA: Diagnosis not present

## 2023-02-19 DIAGNOSIS — E669 Obesity, unspecified: Secondary | ICD-10-CM

## 2023-02-19 NOTE — Patient Instructions (Signed)
  Earache, Adult An earache, or ear pain, can be caused by many things, including: An infection. Ear wax buildup. Ear pressure. Something in the ear that should not be there (foreign body). A sore throat. Tooth problems. Jaw problems. Treatment of the earache will depend on the cause. If the cause is not clear or cannot be known, you may need to watch your symptoms until your earache goes away or until a cause is found. Follow these instructions at home: Medicines Take or apply over-the-counter and prescription medicines only as told by your health care provider. If you were prescribed antibiotics, use them as told by your health care provider. Do not stop using the antibiotic even if you start to feel better. Do not put anything in your ear other than medicine that is prescribed by your health care provider. Managing pain     If directed, apply heat to the affected area as often as told by your health care provider. Use the heat source that your health care provider recommends, such as a moist heat pack or a heating pad. Place a towel between your skin and the heat source. Leave the heat on for 20-30 minutes. If your skin turns bright red, remove the heat right away to prevent burns. The risk of burns is higher if you cannot feel pain, heat, or cold. If directed, put ice on the affected area. To do this: Put ice in a plastic bag. Place a towel between your skin and the bag. Leave the ice on for 20 minutes, 2-3 times a day. If your skin turns bright red, remove the ice right away to prevent skin damage. The risk of skin damage is higher if you cannot feel pain, heat, or cold.  General instructions Pay attention to any changes in your symptoms. Try resting in an upright position instead of lying down. This may help to reduce pressure in your ear and relieve pain. Chew gum if it helps to relieve your ear pain. Treat any allergies as told by your health care provider. Drink enough  fluid to keep your urine pale yellow. It is up to you to get the results of any tests that were done. Ask your health care provider, or the department that is doing the tests, when your results will be ready. Contact a health care provider if: Your pain does not improve within 2 days. Your earache gets worse. You have new symptoms. You have a fever. Get help right away if: You have a severe headache. You have a stiff neck. You have trouble swallowing. You have redness or swelling behind your ear. You have fluid or blood coming from your ear. You have hearing loss. You feel dizzy. This information is not intended to replace advice given to you by your health care provider. Make sure you discuss any questions you have with your health care provider. Document Revised: 04/23/2022 Document Reviewed: 04/23/2022 Elsevier Patient Education  2023 [employer]  

## 2023-02-19 NOTE — Progress Notes (Signed)
Pt presents for urgent care f/u -left ear pain  -pt states it still hurts pain level is about [phone]

## 2023-02-25 ENCOUNTER — Telehealth: Payer: BLUE CROSS/BLUE SHIELD | Admitting: Family Medicine

## 2023-02-25 DIAGNOSIS — G8929 Other chronic pain: Secondary | ICD-10-CM

## 2023-02-25 NOTE — Progress Notes (Signed)
Because you need to follow up with your Ortho provider- or pain management- as per his note- you were given a refill of pain medication with the Flexeril and advised to seek pain management- we do not do pain management in this setting. Your condition warrants further evaluation and I recommend that you be seen in a face to face visit. Please contact your PCP if you need a referral to a pain management clinic.   NOTE: There will be NO CHARGE for this eVisit

## 2023-02-26 ENCOUNTER — Encounter: Payer: Self-pay | Admitting: Orthopedic Surgery

## 2023-02-27 ENCOUNTER — Telehealth: Payer: Self-pay | Admitting: Orthopedic Surgery

## 2023-02-27 DIAGNOSIS — M5416 Radiculopathy, lumbar region: Secondary | ICD-10-CM

## 2023-02-27 NOTE — Telephone Encounter (Signed)
Patient sent a mychart message saying her insurance is not accepted at the pain management center I sent her to. I referred her to [facility] which she thinks her insurance will cover. She states she has some weakness in her leg so I told her to come back and see me as this may change our decision about surgery. We may decide to do a discectomy and accept that her risk is significantly elevated due to her weight.   Callie Fielding, MD Orthopedic Surgeon

## 2023-03-01 ENCOUNTER — Other Ambulatory Visit: Payer: Self-pay

## 2023-03-01 DIAGNOSIS — J4520 Mild intermittent asthma, uncomplicated: Secondary | ICD-10-CM

## 2023-03-01 MED ORDER — ALBUTEROL SULFATE HFA 108 (90 BASE) MCG/ACT IN AERS: INHALATION_SPRAY | RESPIRATORY_TRACT | 3 refills | Status: DC

## 2023-03-02 ENCOUNTER — Telehealth: Payer: BLUE CROSS/BLUE SHIELD | Admitting: Family

## 2023-03-02 DIAGNOSIS — J4541 Moderate persistent asthma with (acute) exacerbation: Secondary | ICD-10-CM

## 2023-03-03 MED ORDER — PREDNISONE 10 MG (21) PO TBPK: ORAL_TABLET | ORAL | 0 refills | Status: DC

## 2023-03-03 MED ORDER — BENZONATATE 100 MG PO CAPS: 100.0000 mg | ORAL_CAPSULE | Freq: Three times a day (TID) | ORAL | 0 refills | Status: DC | PRN

## 2023-03-03 MED ORDER — AZITHROMYCIN 250 MG PO TABS: ORAL_TABLET | ORAL | 0 refills | Status: DC

## 2023-03-03 NOTE — Progress Notes (Signed)
We are sorry that you are not feeling well.  Here is how we plan to help! ° °Based on your presentation I believe you most likely have A cough due to bacteria.  When patients have a fever and a productive cough with a change in color or increased sputum production, we are concerned about bacterial bronchitis.  If left untreated it can progress to pneumonia.  If your symptoms do not improve with your treatment plan it is important that you contact your provider.   I have prescribed Azithromyin 250 mg: two tablets now and then one tablet daily for 4 additonal days  °  °In addition you may use A non-prescription cough medication called Robitussin DAC. Take 2 teaspoons every 8 hours or Delsym: take 2 teaspoons every 12 hours., A non-prescription cough medication called Mucinex DM: take 2 tablets every 12 hours., and A prescription cough medication called Tessalon Perles 100mg. You may take 1-2 capsules every 8 hours as needed for your cough. ° °Prednisone 10 mg daily for 6 days (see taper instructions below) ° °Directions for 6 day taper: °Day 1: 2 tablets before breakfast, 1 after both lunch & dinner and 2 at bedtime °Day 2: 1 tab before breakfast, 1 after both lunch & dinner and 2 at bedtime °Day 3: 1 tab at each meal & 1 at bedtime °Day 4: 1 tab at breakfast, 1 at lunch, 1 at bedtime °Day 5: 1 tab at breakfast & 1 tab at bedtime °Day 6: 1 tab at breakfast ° °From your responses in the eVisit questionnaire you describe inflammation in the upper respiratory tract which is causing a significant cough.  This is commonly called Bronchitis and has four common causes:   °Allergies °Viral Infections °Acid Reflux °Bacterial Infection °Allergies, viruses and acid reflux are treated by controlling symptoms or eliminating the cause. An example might be a cough caused by taking certain blood pressure medications. You stop the cough by changing the medication. Another example might be a cough caused by acid reflux. Controlling the  reflux helps control the cough. ° °USE OF BRONCHODILATOR ("RESCUE") INHALERS: °There is a risk from using your bronchodilator too frequently.  The risk is that over-reliance on a medication which only relaxes the muscles surrounding the breathing tubes can reduce the effectiveness of medications prescribed to reduce swelling and congestion of the tubes themselves.  Although you feel brief relief from the bronchodilator inhaler, your asthma may actually be worsening with the tubes becoming more swollen and filled with mucus.  This can delay other crucial treatments, such as oral steroid medications. If you need to use a bronchodilator inhaler daily, several times per day, you should discuss this with your provider.  There are probably better treatments that could be used to keep your asthma under control.  °   °HOME CARE °Only take medications as instructed by your medical team. °Complete the entire course of an antibiotic. °Drink plenty of fluids and get plenty of rest. °Avoid close contacts especially the very young and the elderly °Cover your mouth if you cough or cough into your sleeve. °Always remember to wash your hands °A steam or ultrasonic humidifier can help congestion.  ° °GET HELP RIGHT AWAY IF: °You develop worsening fever. °You become short of breath °You cough up blood. °Your symptoms persist after you have completed your treatment plan °MAKE SURE YOU  °Understand these instructions. °Will watch your condition. °Will get help right away if you are not doing well or get worse. °  ° °  Thank you for choosing an e-visit. ° °Your e-visit answers were reviewed by a board certified advanced clinical practitioner to complete your personal care plan. Depending upon the condition, your plan could have included both over the counter or prescription medications. ° °Please review your pharmacy choice. Make sure the pharmacy is open so you can pick up prescription now. If there is a problem, you may contact your  provider through MyChart messaging and have the prescription routed to another pharmacy.  Your safety is important to us. If you have drug allergies check your prescription carefully.  ° °For the next 24 hours you can use MyChart to ask questions about today's visit, request a non-urgent call back, or ask for a work or school excuse. °You will get an email in the next two days asking about your experience. I hope that your e-visit has been valuable and will speed your recovery. ° °Approximately 5 minutes was spent documenting and reviewing patient's chart.  ° ° °

## 2023-03-05 ENCOUNTER — Ambulatory Visit (INDEPENDENT_AMBULATORY_CARE_PROVIDER_SITE_OTHER): Payer: BLUE CROSS/BLUE SHIELD | Admitting: Internal Medicine

## 2023-03-05 ENCOUNTER — Encounter: Payer: Self-pay | Admitting: Internal Medicine

## 2023-03-05 VITALS — BP 122/88 | HR 128 | Temp 98.6°F | Ht 64.0 in | Wt 315.0 lb

## 2023-03-05 DIAGNOSIS — J4540 Moderate persistent asthma, uncomplicated: Secondary | ICD-10-CM

## 2023-03-05 DIAGNOSIS — J189 Pneumonia, unspecified organism: Secondary | ICD-10-CM

## 2023-03-05 DIAGNOSIS — R0609 Other forms of dyspnea: Secondary | ICD-10-CM | POA: Diagnosis not present

## 2023-03-05 DIAGNOSIS — J45901 Unspecified asthma with (acute) exacerbation: Secondary | ICD-10-CM | POA: Diagnosis not present

## 2023-03-05 LAB — D-DIMER, QUANTITATIVE: D-Dimer, Quant: <0.19 mcg/mL FEU (ref <0.50)

## 2023-03-05 NOTE — Patient Instructions (Signed)
Order- lab- D-dimer, BMET      dx dyspnea on exertion, asthma exacerbation  Order- follow-up with Dr Candis Shine or [place], in 2 weeks

## 2023-03-05 NOTE — Progress Notes (Signed)
  ID: Tina Manning, female    DOB: 1991/01/07, 32 y.o.   MRN: [id number]  3/12/24Allison Quarry, NP  Severe asthma with acute exacerbation She has poorly controlled severe asthma with allergic phenotype.  Now with acute exacerbation possibly related to CAP.  She is slowly improving.  Recommended that she continue prednisone taper as previously prescribed.  She has had multiple flares over the last year requiring prednisone.  Given this, recommended we trial stepping her up to triple therapy.  Added on prn budesonide nebs for breakthrough shortness of breath or wheezing.  I believe she would also benefit from biologic therapy.  We will get the process started for Dupixent.  Recommended that she still see allergy for further recommendations now that she has insurance coverage.  Provided her with the phone number today.  03/05/23- Acute- work in- Chest congestion x3 days feels a bunch of pressure in chest when laying down. Also complains of sob and wheezing. Patient has ben tested for flu and covid but negative -Breztri, singulair, benzonatate, ventolin hfa,  prednisone, Neb Duoneb ED 02/10/23- earache, > cefdinir, prednisone C/O chest tight and increased wheeze x 3 days. Worse lying down> chest "heavy".May have mild URI with scant yellow, no fever. Denies fever, sore throat, leg discomfort. Not usually bothered much by pollen. No sick exposure. Now on Zpak and day 2 f prednisone taper, tessalon. CXR 08/30/22- IMPRESSION: No radiographic evidence of pneumonia. Note is made that the ground-glass opacities raising the question of an infectious or inflammatory process within the bilateral lung apices were only seen on CT 08/29/2022, not on same day 08/29/2022 radiographs.  ROS-see HPI  + = positive Constitutional:    weight loss, night sweats, fevers, chills, fatigue, lassitude. HEENT:    headaches, difficulty swallowing, tooth/dental problems, sore throat,       sneezing, itching, ear ache,  nasal congestion, post nasal drip, snoring CV:    chest pain, orthopnea, PND, swelling in lower extremities, anasarca,                                   dizziness, palpitations Resp:   +shortness of breath with exertion or at rest.                +productive cough,   non-productive cough, coughing up of blood.              +change in color of mucus.  +wheezing.   Skin:    rash or lesions. GI:  No-   heartburn, indigestion, abdominal pain, nausea, vomiting, diarrhea,                 change in bowel habits, loss of appetite GU: dysuria, change in color of urine, no urgency or frequency.   flank pain. MS:   joint pain, stiffness, decreased range of motion, back pain. Neuro-     nothing unusual Psych:  change in mood or affect.  depression or anxiety.   memory loss.  OBJ- Physical Exam General- Alert, Oriented, Affect-appropriate, Distress- none acute +obese Skin- rash-none, lesions- none, excoriation- none Lymphadenopathy- none Head- atraumatic            Eyes- Gross vision intact, PERRLA, conjunctivae and secretions clear            Ears- Hearing, canals-normal            Nose- Clear, no-Septal dev, mucus, polyps, erosion, perforation  Throat- Mallampati II , mucosa clear , drainage- none, tonsils- atrophic Neck- flexible , trachea midline, no stridor , thyroid nl, carotid no bruit Chest - symmetrical excursion , unlabored           Heart/CV- RRR , no murmur , no gallop  , no rub, nl s1 s2                           - JVD- none , edema- none, stasis changes- none, varices- none           Lung- clear to P&A/ +fair airflow, wheeze- none, cough- none , dullness-none, rub- none           Chest wall-  Abd-  Br/ Gen/ Rectal- Not done, not indicated Extrem- cyanosis- none, clubbing, none, atrophy- none, strength- nl Neuro- grossly intact to observation

## 2023-03-06 LAB — BASIC METABOLIC PANEL
BUN: 17 mg/dL (ref 6–23)
CO2: 23 mEq/L (ref 19–32)
Calcium: 8.8 mg/dL (ref 8.4–10.5)
Chloride: 106 mEq/L (ref 96–112)
Creatinine, Ser: 0.84 mg/dL (ref 0.40–1.20)
GFR: 92.3 mL/min (ref >60.00)
Glucose, Bld: 104 mg/dL — ABNORMAL HIGH (ref 70–99)
Potassium: 3.7 mEq/L (ref 3.5–5.1)
Sodium: 138 mEq/L (ref 135–145)

## 2023-03-12 ENCOUNTER — Emergency Department (HOSPITAL_COMMUNITY): Payer: BLUE CROSS/BLUE SHIELD

## 2023-03-12 ENCOUNTER — Other Ambulatory Visit: Payer: Self-pay

## 2023-03-12 ENCOUNTER — Emergency Department (HOSPITAL_COMMUNITY)
Admission: EM | Admit: 2023-03-12 | Discharge: 2023-03-12 | Disposition: A | Discharge status: Hospice | Payer: BLUE CROSS/BLUE SHIELD | Attending: Emergency Medicine | Admitting: Emergency Medicine

## 2023-03-12 DIAGNOSIS — Z7951 Long term (current) use of inhaled steroids: Secondary | ICD-10-CM | POA: Insufficient documentation

## 2023-03-12 DIAGNOSIS — R0602 Shortness of breath: Secondary | ICD-10-CM | POA: Insufficient documentation

## 2023-03-12 DIAGNOSIS — J9801 Acute bronchospasm: Secondary | ICD-10-CM

## 2023-03-12 DIAGNOSIS — Z8616 Personal history of COVID-19: Secondary | ICD-10-CM | POA: Diagnosis not present

## 2023-03-12 DIAGNOSIS — J45909 Unspecified asthma, uncomplicated: Secondary | ICD-10-CM | POA: Diagnosis not present

## 2023-03-12 DIAGNOSIS — R000 Tachycardia, unspecified: Secondary | ICD-10-CM | POA: Insufficient documentation

## 2023-03-12 LAB — BASIC METABOLIC PANEL
Anion gap: 11 (ref 5–15)
BUN: 11 mg/dL (ref 6–20)
CO2: 25 mmol/L (ref 22–32)
Calcium: 9.2 mg/dL (ref 8.9–10.3)
Chloride: 102 mmol/L (ref 98–111)
Creatinine, Ser: 0.76 mg/dL (ref 0.44–1.00)
GFR, Estimated: >60 mL/min (ref >60)
Glucose, Bld: 119 mg/dL — ABNORMAL HIGH (ref 70–99)
Potassium: 3.8 mmol/L (ref 3.5–5.1)
Sodium: 138 mmol/L (ref 135–145)

## 2023-03-12 LAB — CBC
HCT: 41.5 % (ref 36.0–46.0)
Hemoglobin: 13.2 g/dL (ref 12.0–15.0)
MCH: 26.1 pg (ref 26.0–34.0)
MCHC: 31.8 g/dL (ref 30.0–36.0)
MCV: 82.2 fL (ref 80.0–100.0)
Platelets: 522 10*3/uL — ABNORMAL HIGH (ref 150–400)
RBC: 5.05 MIL/uL (ref 3.87–5.11)
RDW: 15.9 % — ABNORMAL HIGH (ref 11.5–15.5)
WBC: 19.5 10*3/uL — ABNORMAL HIGH (ref 4.0–10.5)
nRBC: 0 % (ref 0.0–0.2)

## 2023-03-12 MED ORDER — ONDANSETRON HCL 4 MG/2ML IJ SOLN
4.0000 mg | Freq: Once | INTRAMUSCULAR | Status: AC
  Administered 2023-03-12: 4 mg via INTRAVENOUS
  Filled 2023-03-12: qty 2

## 2023-03-12 NOTE — ED Provider Notes (Signed)
[facility] Provider Note   CSN: [id number] Arrival date & time: 03/12/23  0854     History  Chief Complaint  Patient presents with   Asthma    Tina Manning is a 32 y.o. female.   Asthma  Patient with history of asthma.  Shortness of breath.  For the last 2 weeks has been treated for URI.  Has been on since prednisone 10 mg about the last 5 days.  Also had been on a Z-Pak that she just finished up.  Worse this morning.  States she feels if she has to have a cough.  By EMS had sats of 93%.  Had been given Solu-Medrol and has had a total of 4 breathing treatments between hers at home and the 2 by EMS.  States she feels that the wheezing is improving.  Has chronic asthma.  Has reportedly had intubation in the past.    Past Medical History:  Diagnosis Date   ADHD (attention deficit hyperactivity disorder)    Anemia    Anxiety    Asthma    Complication of anesthesia    COVID 01/2022   out of work for a week   Endometriosis    GERD (gastroesophageal reflux disease)    Heart rate fast    takes Metoprolol   History of kidney stones    Pneumonia    PONV (postoperative nausea and vomiting)    Pre-diabetes    Tachycardia     Home Medications Prior to Admission medications   Medication Sig Start Date End Date Taking? Authorizing Provider  albuterol (VENTOLIN HFA) 108 (90 Base) MCG/ACT inhaler INHALE 1 TO 2 PUFFS INTO THE LUNGS EVERY 6 HOURS AS NEEDED FOR WHEEZE OR SHORTNESS OF BREATH 03/01/23   Tanda Rockers, MD  azithromycin (ZITHROMAX) 250 MG tablet Take 500 mg once, then 250 mg for four days 03/03/23   Evelina Dun A, FNP  benzonatate (TESSALON PERLES) 100 MG capsule Take 1 capsule (100 mg total) by mouth 3 (three) times daily as needed. 03/03/23   Evelina Dun A, FNP  Budeson-Glycopyrrol-Formoterol (BREZTRI AEROSPHERE) 160-9-4.8 MCG/ACT AERO Inhale 2 puffs into the lungs in the morning and at bedtime. 08/30/22   Cobb,  Karie Schwalbe, NP  budesonide (PULMICORT) 0.5 MG/2ML nebulizer solution Take 2 mLs (0.5 mg total) by nebulization in the morning and at bedtime. 08/30/22   Cobb, Karie Schwalbe, NP  diclofenac Sodium (VOLTAREN ARTHRITIS PAIN) 1 % GEL Apply 2 g topically 4 (four) times daily. 11/14/22   Argentina Donovan, PA-C  ibuprofen (ADVIL) 800 MG tablet Take 1 tablet (800 mg total) by mouth every 8 (eight) hours as needed. 01/09/23   Leandrew Koyanagi, MD  ipratropium-albuterol (DUONEB) 0.5-2.5 (3) MG/3ML SOLN Take 3 mLs by nebulization in the morning, at noon, in the evening, and at bedtime. Patient taking differently: Take 3 mLs by nebulization every 4 (four) hours as needed (Asthma). 01/04/22   Camillia Herter, NP  metoprolol succinate (TOPROL-XL) 25 MG 24 hr tablet TAKE 1 TABLET(25 MG) BY MOUTH DAILY 02/18/23   Camillia Herter, NP  mometasone-formoterol [facility]) 200-5 MCG/ACT AERO Take 2 puffs first thing in am and then another 2 puffs about 12 hours later. 08/15/21   Tanda Rockers, MD  montelukast (SINGULAIR) 10 MG tablet Take 1 tablet (10 mg total) by mouth at bedtime. 09/12/21   Martyn Ehrich, NP  pantoprazole (PROTONIX) 40 MG tablet TAKE 1 TABLET(40 MG) BY  MOUTH DAILY 11/21/22   Camillia Herter, NP  predniSONE (STERAPRED UNI-PAK 21 TAB) 10 MG (21) TBPK tablet Use as directed 03/03/23   Evelina Dun A, FNP  triamcinolone cream (KENALOG) 0.1 % Apply 1 Application topically 2 (two) times daily. 10/18/22   Mar Daring, PA-C  zolpidem (AMBIEN CR) 12.5 MG CR tablet Take 1 tablet (12.5 mg total) by mouth at bedtime. 01/21/23   Laurin Coder, MD      Allergies    Bee venom and Penicillins    Review of Systems   Review of Systems  Physical Exam Updated Vital Signs BP (!) 148/100   Pulse (!) 116   Temp 98.1 F (36.7 C) (Oral)   Resp 15   Ht 5\' 4"  (1.626 m)   Wt (!) 140.6 kg   SpO2 98%   BMI 53.21 kg/m  Physical Exam Constitutional:      Appearance: She is obese.  Cardiovascular:     Rate  and Rhythm: Tachycardia present.  Pulmonary:     Breath sounds: Wheezing present.     Comments: Mildly harsh breath sounds with prolonged expirations and mild wheeze on the right side. Musculoskeletal:     Right lower leg: No edema.     Left lower leg: No edema.  Skin:    General: Skin is warm.     Capillary Refill: Capillary refill takes less than 2 seconds.  Neurological:     Mental Status: She is oriented to person, place, and time.     ED Results / Procedures / Treatments   Labs (all labs ordered are listed, but only abnormal results are displayed) Labs Reviewed  BASIC METABOLIC PANEL  CBC    EKG EKG Interpretation  Date/Time:  Tuesday March 12 2023 09:01:13 EDT Ventricular Rate:  123 PR Interval:  94 QRS Duration: 107 QT Interval:  353 QTC Calculation: 505 R Axis:   -2 Text Interpretation: Sinus tachycardia Incomplete left bundle branch block Low voltage, precordial leads Prolonged QT interval Baseline wander in lead(s) V4 No significant change since last tracing Confirmed by Davonna Belling [phone]) on 03/12/2023 9:20:17 AM  Radiology DG Chest Portable 1 View  Result Date: 03/12/2023 CLINICAL DATA:  Shortness of breath. EXAM: PORTABLE CHEST 1 VIEW COMPARISON:  Chest x-ray 08/30/2022. FINDINGS: The heart size and mediastinal contours are within normal limits. Low lung volumes. Both lungs are clear. No visible pleural effusions or pneumothorax. The visualized skeletal structures are unremarkable. IMPRESSION: No active disease. Electronically Signed   By: Margaretha Sheffield M.D.   On: 03/12/2023 09:47    Procedures Procedures    Medications Ordered in ED Medications  ondansetron (ZOFRAN) injection 4 mg (4 mg Intravenous Given 03/12/23 0930)    ED Course/ Medical Decision Making/ A&P                             Medical Decision Making Amount and/or Complexity of Data Reviewed Labs: ordered. Radiology: ordered.  Risk Prescription drug management.   Patient  is shortness of breath.  History of asthma.  Rather severe at baseline.  Currently on steroids and just finished Z-Pak.  Given Solu-Medrol in the total of 4 breathing treatments before here.  I reviewed previous pulmonary note.  Independently interpreted chest x-ray which does not show pneumonia.  Feels better after breathing treatments previously provided.  Heart rate is coming down.  Lungs are clearing up.  Since just had more of an acute  episode today and not worsening over the last couple days feel less likely this is an acute severe asthma exacerbation that would need a higher dose of steroids.  Did have IV steroids initially however.  Will follow-up with pulmonology.  Do not think we need more antibiotics at this time.          Final Clinical Impression(s) / ED Diagnoses Final diagnoses:  None    Rx / DC Orders ED Discharge Orders     None         Davonna Belling, MD 03/12/23 1136

## 2023-03-12 NOTE — ED Notes (Signed)
Pt tolerating room air.

## 2023-03-12 NOTE — ED Triage Notes (Signed)
Pt arrived via for ems exacerbation pt reports that she was dx with URI 2 weeks ago and finished z pack yesterday and still is on prednisone states today tried 2 home nebs and rescue inhaler with no relief Per ems initial sats 93% received A and A x 2 ans 125 of solumedrol Pt now maintaining sats >97% on 2 Lpm with upper exp wheezing with decreased breath sounds on left and clear bilat at bases

## 2023-03-12 NOTE — ED Notes (Signed)
X-ray at bedside

## 2023-03-12 NOTE — ED Notes (Signed)
Pt has been admitted and intubated for asthma related issues in the past

## 2023-03-12 NOTE — Discharge Instructions (Signed)
Your workup overall was reassuring today.  If you have more trouble breathing you may need steroids but for now I think we can hold off.  Follow-up with your pulmonologist on Monday as planned and call sooner if needed.

## 2023-03-18 NOTE — Progress Notes (Deleted)
Tina Manning, female    DOB: Dec 11, 1991   MRN: [id number]   Brief patient profile:  70   yowf never smoker with childhood asthma typically needing saba  while living in [place] where became more of a chronic issue but still only on saba as "maint" and required  intubation x 10 days  Thanksgiving 2021 at [facility] referred to pulmonary clinic 08/15/2021 by Durene Fruits, NP for asthma.  Prior to the chronic asthma problem in 2020 was maintaining  wt  around 230    History of Present Illness  08/15/2021  Pulmonary/ 1st office eval/[name] no maint rx  since Feb 2022 arrived in [place] Chief Complaint  Patient presents with   Consult    Patient reports increased shortness of breath in last 2 months at rest and with exertion.   Dyspnea:  can finish [employer] grocery store s stopping  Cough: none Sleep: 45 degrees x sev years SABA use: saba hfa avg 4-5 x per day/finished pred x 5 days prior to OV  - last dose 3.5 h prior to OV   Rec Plan A = Automatic = Always=  Dulera 200 (or Breztri) Take 2 puffs first thing in am and then another 2 puffs about 12 hours later.  And take Pantoprazole (protonix) 40 mg   Take  30-60 min before first meal of the day and Pepcid (famotidine)  20 mg after supper    Work on inhaler technique:    Plan B = Backup (to supplement plan A, not to replace it) Only use your albuterol inhaler as a rescue medication Plan C = Crisis (instead of Plan B but only if Plan B stops working) - only use your albuterol nebulizer if you first try Plan B and it fails to help > ok to use the nebulizer up to every 4 hours but if start needing it regularly call for immediate appointment Also ok to Try albuterol 15 min before an activity (on alternating days)  that you know would make you short of breath and see if it makes any difference and if makes none then don't take albuterol after activity unless you can't catch your breath as this means it's the resting that helps, not the  albuterol. Depomedrol 120 mg IM  GERD diet reviewed, bed blocks rec   Singulair added by NP  09/12/21 with Allergy profile    Eos 0.2/  IgE  482   11/07/2021  f/u ov/[name] re: chronic asthma  maint on dulera  200 bid due to ha  Chief Complaint  Patient presents with   Follow-up    Upper left back pain with inspiration last pm.  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  Cough: none  Sleeping: 45 degrees electric = baseline  SABA use: avg  1-2 pffs per week until 11/14 pm  plus neb this am  02: none  Covid status:   vax x 2  Cp with dep breath acute onset pm prior to ov  New pain L lat thigh also but no calf pain or sweeling  Rec Plan A = Automatic = Always=    Dulera 1-2 pffs every 12 hours  Plan B = Backup (to supplement plan A, not to replace it) Only use your albuterol inhaler as a rescue medication  Plan C = Crisis (instead of Plan B but only if Plan B stops working) - only use your  albuterol nebulizer if you first try Plan B  Flu shot today    02/27/2022  f/u ov/[name] re:  asthma  maint on dulera 200 2bid /singulair  No chief complaint on file.   Dyspnea:  walks x 30 min slow pace, stops at top of hills typically  Cough: dry some sense of pnds / nasal stuffiness  Sleeping: flat bed/ 3 pillows  SABA use: using nebs in am just last few days prior to OV   02: none  Covid status:   vax x 2  Rec No change in your regular medications Plan A = Automatic = Always=    dulera 200 Take 2 puffs first thing in am and then another 2 puffs about 12 hours later. And singulair each pm Plan B = Backup (to supplement plan A, not to replace it) Only use your albuterol inhaler as a rescue medication to be used if you can't catch your breath Plan C = Crisis (instead of Plan B but only if Plan B stops working) - only use your albuterol nebulizer if you first try Plan B and it fails to help Prednisone 10 mg take  4 each am x 2 days,    2 each am x 2 days,  1 each am x 2 days and stop  I have referred you to allergy and will see you back here in meantime if needed for worse breathing  but once established with allergy they can refill your medications   03/19/2023  f/u ov/[place] [facility]/[name] re: *** maint on ***  No chief complaint on file.   Dyspnea:  *** Cough: *** Sleeping: *** SABA use: *** 02: *** Covid status: *** Lung cancer screening: ***   No obvious day to day or daytime variability or assoc excess/ purulent sputum or mucus plugs or hemoptysis or cp or chest tightness, subjective wheeze or overt sinus or hb symptoms.   *** without nocturnal  or early am exacerbation  of respiratory  c/o's or need for noct saba. Also denies any obvious fluctuation of symptoms with weather or environmental changes or other aggravating or alleviating factors except as outlined above   No unusual exposure hx or h/o childhood pna/ asthma or knowledge of premature birth.  Current Allergies, Complete Past Medical History, Past Surgical History, Family History, and Social History were reviewed in [employer] record.  ROS  The following are not active complaints unless bolded Hoarseness, sore throat, dysphagia, dental problems, itching, sneezing,  nasal congestion or discharge of excess mucus or purulent secretions, ear ache,   fever, chills, sweats, unintended wt loss or wt gain, classically pleuritic or exertional cp,  orthopnea pnd or arm/hand swelling  or leg swelling, presyncope, palpitations, abdominal pain, anorexia, nausea, vomiting, diarrhea  or change in bowel habits or change in bladder habits, change in stools or change in urine, dysuria, hematuria,  rash, arthralgias, visual complaints, headache, numbness, weakness or ataxia or problems with walking or coordination,  change in mood or  memory.        No outpatient medications have been marked as taking for the 03/19/23 encounter (Appointment) with  Tanda Rockers, MD.                Past Medical History:  Diagnosis Date   Anemia    Asthma    Complication of anesthesia    Diabetes mellitus without complication ([place])    Endometriosis    PONV (postoperative nausea and vomiting)    Tachycardia  Objective:    wts   03/19/2023          *** 02/27/2022            320  11/07/2021        310   09/13/21 (!) 306 lb (138.8 kg)  09/12/21 (!) 308 lb (139.7 kg)  08/15/21 (!) 311 lb (141.1 kg)    Vital signs reviewed  03/19/2023  - Note at rest 02 sats  ***% on ***   General appearance:    ***     insp /exp wheezing  bilaterall***  .           Assessment

## 2023-03-19 ENCOUNTER — Ambulatory Visit: Payer: BLUE CROSS/BLUE SHIELD | Admitting: Internal Medicine

## 2023-03-22 ENCOUNTER — Telehealth: Payer: BLUE CROSS/BLUE SHIELD | Admitting: Family Medicine

## 2023-03-22 DIAGNOSIS — M62830 Muscle spasm of back: Secondary | ICD-10-CM | POA: Diagnosis not present

## 2023-03-23 MED ORDER — CYCLOBENZAPRINE HCL 10 MG PO TABS: 10.0000 mg | ORAL_TABLET | Freq: Three times a day (TID) | ORAL | 0 refills | Status: DC | PRN

## 2023-03-23 NOTE — Progress Notes (Signed)

## 2023-03-25 NOTE — Telephone Encounter (Signed)
Received a message from patient asking for a refill on her Ambien 12.5mg . Last refill was on 01/21/23 for 30 tablets, 1 refill.   Dr. Ander Slade, please advise if you are ok with this refill.

## 2023-03-26 ENCOUNTER — Other Ambulatory Visit: Payer: Self-pay | Admitting: Pulmonary Disease

## 2023-03-26 ENCOUNTER — Other Ambulatory Visit: Payer: Self-pay | Admitting: Family

## 2023-03-26 DIAGNOSIS — R000 Tachycardia, unspecified: Secondary | ICD-10-CM

## 2023-03-26 MED ORDER — METOPROLOL SUCCINATE ER 25 MG PO TB24: ORAL_TABLET | ORAL | 0 refills | Status: DC

## 2023-03-26 MED ORDER — ZOLPIDEM TARTRATE ER 12.5 MG PO TBCR: 12.5000 mg | EXTENDED_RELEASE_TABLET | Freq: Every day | ORAL | 2 refills | Status: DC

## 2023-03-26 NOTE — Telephone Encounter (Signed)
Prescription for Ambien refilled, sent into pharmacy

## 2023-03-27 ENCOUNTER — Telehealth: Payer: BLUE CROSS/BLUE SHIELD | Admitting: Physician Assistant

## 2023-03-27 DIAGNOSIS — B370 Candidal stomatitis: Secondary | ICD-10-CM

## 2023-03-27 MED ORDER — NYSTATIN 100000 UNIT/ML MT SUSP: 5.0000 mL | Freq: Four times a day (QID) | OROMUCOSAL | 0 refills | Status: DC

## 2023-03-27 NOTE — Progress Notes (Signed)
E-Visit for Mouth Ulcers  We are sorry that you are not feeling well.  Here is how we plan to help!  Based on what you have shared with me, it appears that you do have thrush.  I have prescribed Nystatin mouth rinse Use 66mL swish and spit every 6 hours for 7 days   Prevention: Talk to your doctor if you are taking meds that are known to cause mouth ulcers such as:   Anti-inflammatory drugs (for example Ibuprofen, Naproxen sodium), pain killers, Beta blockers, Oral nicotine replacement drugs, Some street drugs (heroin).   Avoid allowing any tablets to dissolve in your mouth that are meant to swallowed whole Avoid foods/drinks that trigger or worsen symptoms Keep your mouth clean with daily brushing and flossing  Home Care: The goal with treatment is to ease the pain where ulcers occur and help them heal as quickly as possible.  There is no medical treatment to prevent mouth ulcers from coming back or recurring.  Avoid spicy and acidic foods Eat soft foods and avoid rough, crunchy foods Avoid chewing gum Do not use toothpaste that contains sodium lauryl sulphite Use a straw to drink which helps avoid liquids toughing the ulcers near the front of your mouth Use a very soft toothbrush If you have dentures or dental hardware that you feel is not fitting well or contributing to his, please see your dentist. Use saltwater mouthwash which helps healing. Dissolve a  teaspoon of salt in a glass of warm water. Swish around your mouth and spit it out. This can be used as needed if it is soothing.   GET HELP RIGHT AWAY IF: Persistent ulcers require checking IN PERSON (face to face). Any mouth lesion lasting longer than a month should be seen by your DENTIST as soon as possible for evaluation for possible oral cancer. If you have a non-painful ulcer in 1 or more areas of your mouth Ulcers that are spreading, are very large or particularly painful Ulcers last longer than one week without improving on  treatment If you develop a fever, swollen glands and begin to feel unwell Ulcers that developed after starting a new medication MAKE SURE YOU: Understand these instructions. Will watch your condition. Will get help right away if you are not doing well or get worse.  Thank you for choosing an e-visit.  Your e-visit answers were reviewed by a board certified advanced clinical practitioner to complete your personal care plan. Depending upon the condition, your plan could have included both over the counter or prescription medications.  Please review your pharmacy choice. Make sure the pharmacy is open so you can pick up prescription now. If there is a problem, you may contact your provider through [employer] and have the prescription routed to another pharmacy.  Your safety is important to [place]. If you have drug allergies check your prescription carefully.   For the next 24 hours you can use MyChart to ask questions about today's visit, request a non-urgent call back, or ask for a work or school excuse. You will get an email in the next two days asking about your experience. I hope that your e-visit has been valuable and will speed your recovery.  I have spent 5 minutes in review of e-visit questionnaire, review and updating patient chart, medical decision making and response to patient.   Mar Daring, PA-C

## 2023-03-28 ENCOUNTER — Other Ambulatory Visit: Payer: Self-pay | Admitting: Pulmonary Disease

## 2023-03-28 MED ORDER — ZOLPIDEM TARTRATE ER 12.5 MG PO TBCR: 12.5000 mg | EXTENDED_RELEASE_TABLET | Freq: Every day | ORAL | 2 refills | Status: DC

## 2023-03-28 NOTE — Telephone Encounter (Signed)
Prescription sent into [facility].

## 2023-04-04 ENCOUNTER — Telehealth: Payer: BLUE CROSS/BLUE SHIELD | Admitting: Physician Assistant

## 2023-04-04 DIAGNOSIS — K140 Glossitis: Secondary | ICD-10-CM

## 2023-04-05 NOTE — Progress Notes (Signed)
Because you are failing first line treatments, I feel your condition warrants further evaluation and I recommend that you be seen in a face to face visit.   NOTE: There will be NO CHARGE for this eVisit   If you are having a true medical emergency please call 911.      For an urgent face to face visit, [place] has eight urgent care centers for your convenience:   NEW!! [facility] [place] Get Driving Directions [phone] [address], [address] [place], [address]    [facility] [place] Get Driving Directions [phone] [address] [place], [place] [address]   [facility] ([place]) Get Driving Directions [phone] [address] [place], [place] [address]  [facility] ([place]) Get Driving Directions [phone] [address] [place],  [place]  [address]  [facility] [facility] [place] Get Driving Directions  [phone] [address] [place],  [place] [address]   [facility] Get Driving Directions [phone] [address], [address] , [place] [address]   [facility] [place] Get Driving Directions  [phone] [address] [place], [place] [address]   [facility] [place] Get Driving Directions [phone] [address] [place], [place] [address]  Your MyChart E-visit questionnaire answers were reviewed by a board certified advanced clinical practitioner to complete your personal care plan based on your specific symptoms.  Thank you for using e-Visits.   I have spent 5 minutes in review of e-visit questionnaire, review and updating patient chart, medical decision making and response to patient.   Ada Woodbury M Blue Winther, PA-C  

## 2023-04-08 NOTE — Progress Notes (Unsigned)
Patient ID: Tina Manning, female    DOB: 1991/01/10  MRN: [id number]  CC: Tongue Pain  Subjective: Tina Manning is a 32 y.o. female who presents for tongue pain.   Her concerns today include:  - Patient had recent appointment on 03/27/2023 at [facility]. During that encounter she was prescribed Nystatin for thrush. Today patient states since then tongue pain persisting and Nystatin did not provide relief. She has also tried an over-the-counter mouth rinse which has not been helpful. She denies red flag symptoms associated with tongue pain.  - Reports she did establish with Cardiology. Reports she had an unpleasant patient experience. States the cardiologist told her that she "needs to lose weight" and was told to continue Metoprolol. She is still taking Metoprolol as prescribed. She denies red flag symptoms such as but not limited to chest pain, shortness of breath, worst headache of life, nausea/vomiting. She would like a referral to a new cardiologist.    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   Hyperlipidemia 08/15/2021   Prediabetes 08/15/2021   Morbid obesity due to excess calories 08/15/2021   Closed fracture of lower end of right radius with routine healing    Anxiety and depression 08/09/2011   Endometriosis 03/27/2009   Tachycardia 11/27/1998   Severe asthma with acute exacerbation 04/24/1994     Current Outpatient Medications on File  Prior to Visit  Medication Sig Dispense Refill   albuterol (VENTOLIN HFA) 108 (90 Base) MCG/ACT inhaler INHALE 1 TO 2 PUFFS INTO THE LUNGS EVERY 6 HOURS AS NEEDED FOR WHEEZE OR SHORTNESS OF BREATH 6.7 g 3   Budeson-Glycopyrrol-Formoterol (BREZTRI AEROSPHERE) 160-9-4.8 MCG/ACT AERO Inhale 2 puffs into the lungs in the morning and at bedtime. 1 each 0   budesonide (PULMICORT) 0.5 MG/2ML nebulizer solution Take 2 mLs (0.5 mg total) by nebulization in the morning and at bedtime. 60 mL 5   ibuprofen (ADVIL) 800 MG tablet Take 1 tablet (800 mg total) by mouth every 8 (eight) hours as needed. 30 tablet 2   ipratropium-albuterol (DUONEB) 0.5-2.5 (3) MG/3ML SOLN Take 3 mLs by nebulization in the morning, at noon, in the evening, and at bedtime. (Patient taking differently: Take 3 mLs by nebulization every 4 (four) hours as needed (Asthma).) 360 mL 2   metoprolol succinate (TOPROL-XL) 25 MG 24 hr tablet TAKE 1 TABLET(25 MG) BY MOUTH DAILY 90 tablet 0   mometasone-formoterol (DULERA) 200-5 MCG/ACT AERO Take 2 puffs first thing in am and then another 2 puffs about 12 hours later. 1 each 11   montelukast (SINGULAIR) 10 MG tablet Take 1 tablet (10 mg total) by mouth at bedtime. 30 tablet 11   nystatin (MYCOSTATIN) 100000 UNIT/ML suspension Take 5 mLs (500,000 Units total) by mouth 4 (four) times daily. 120 mL 0   pantoprazole (PROTONIX) 40 MG tablet TAKE 1 TABLET(40 MG) BY MOUTH DAILY 90 tablet 1   zolpidem (AMBIEN CR) 12.5 MG CR tablet Take 1 tablet (12.5 mg  total) by mouth at bedtime. 30 tablet 2   cyclobenzaprine (FLEXERIL) 10 MG tablet Take 1 tablet (10 mg total) by mouth 3 (three) times daily as needed for muscle spasms. (Patient not taking: Reported on 04/09/2023) 30 tablet 0   diclofenac Sodium (VOLTAREN ARTHRITIS PAIN) 1 % GEL Apply 2 g topically 4 (four) times daily. (Patient not taking: Reported on 04/09/2023) 100 g 1   triamcinolone cream (KENALOG) 0.1 % Apply 1 Application topically 2 (two) times daily.  (Patient not taking: Reported on 04/09/2023) 80 g 0   No current facility-administered medications on file prior to visit.    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    Birth control/protection: None  Other Topics Concern   Not on file  Social History Narrative   Not on file   Social Determinants of Health   Financial Resource Strain: Low Risk  (04/09/2023)   Overall Financial Resource Strain (CARDIA)    Difficulty of Paying Living Expenses: Not very hard  Food Insecurity: Food Insecurity Present (04/09/2023)   Hunger Vital Sign    Worried About Running Out of Food in the Last Year: Sometimes true    Ran Out of Food in the Last Year: Sometimes true  Transportation Needs: No Transportation Needs (04/09/2023)   PRAPARE - Administrator, Civil Service (Medical): No    Lack of Transportation (Non-Medical): No  Physical Activity: Unknown (04/09/2023)   Exercise Vital Sign    Days of Exercise per Week: 0 days    Minutes of Exercise per Session: Not on file  Stress: Stress Concern Present (04/09/2023)   [employer] of Occupational Health - Occupational Stress Questionnaire    Feeling of Stress : Very much  Social Connections: Socially Isolated (04/09/2023)   Social Connection and Isolation Panel [NHANES]    Frequency of Communication with Friends and Family: Never    Frequency of Social Gatherings with Friends and Family:  Never    Attends Religious Services: Never    Database administrator or Organizations: No    Attends Engineer, structural: Not on file    Marital Status: Married  Catering manager Violence: Not on file    Family History  Problem Relation Age of Onset   Heart attack Mother    Hypertension Mother    Breast cancer Mother    Polycystic ovary syndrome Mother    Heart disease Father    Hypertension Father    Liver cancer Father     Past Surgical History:  Procedure Laterality Date   CHOLECYSTECTOMY     DILITATION & CURRETTAGE/HYSTROSCOPY WITH NOVASURE ABLATION N/A 03/05/2022   Procedure: DILATATION & CURETTAGE/HYSTEROSCOPY WITH NOVASURE ABLATION;  Surgeon: Willodean Rosenthal, MD;  Location: [facility] OR;  Service: Gynecology;  Laterality: N/A;   OPEN REDUCTION INTERNAL FIXATION (ORIF) DISTAL RADIAL FRACTURE Right 05/15/2021  Procedure: OPEN REDUCTION INTERNAL FIXATION (ORIF)RIGHT  DISTAL RADIAL FRACTURE;  Surgeon: Cammy Copa, MD;  Location[facility] OR;  Service: Orthopedics;  Laterality: Right;   TYMPANOSTOMY TUBE PLACEMENT     as a baby    ROS: Review of Systems Negative except as stated above  PHYSICAL EXAM: BP (!) 166/85 (BP Location: Right Arm, Cuff Size: Normal)   Pulse (!) 132   Temp 98.6 F (37 C) (Oral)   Ht  (1.626 m)   Wt (!) 322 lb (146.1 kg)   SpO2 95%   BMI 55.27 kg/m   Physical Exam HENT:     Head: Normocephalic and atraumatic.     Nose: Nose normal.     Mouth/Throat:     Mouth: Mucous membranes are moist.     Pharynx: Oropharynx is clear.     Comments: Tongue with cobblestone appearance. No drainage and no additional presentation.  Eyes:     Extraocular Movements: Extraocular movements intact.     Conjunctiva/sclera: Conjunctivae normal.     Pupils: Pupils are equal, round, and reactive to light.  Cardiovascular:     Rate and Rhythm: Tachycardia present.     Pulses: Normal pulses.     Heart sounds: Normal heart sounds.  Pulmonary:      Effort: Pulmonary effort is normal.     Breath sounds: Normal breath sounds.  Musculoskeletal:     Cervical back: Normal range of motion and neck supple.  Neurological:     General: No focal deficit present.     Mental Status: She is alert and oriented to person, place, and time.  Psychiatric:        Mood and Affect: Mood normal.        Behavior: Behavior normal.     ASSESSMENT AND PLAN: 1. Glossitis 2. Tongue pain - Routine screening.  - Referral to ENT for further evaluation/management. - Vitamin B12 - Vitamin D, 25-hydroxy - Ambulatory referral to ENT  3. Primary hypertension 4. Tachycardia - Blood pressure not at goal during today's visit. Patient asymptomatic without chest pressure, chest pain, palpitations, shortness of breath, worst headache of life, and any additional red flag symptoms. - Continue Metoprolol Succinate as prescribed. No refills needed as of present.  - Referral to Cardiology for further evaluation/management. During the interim follow-up with primary provider as scheduled.  - Ambulatory referral to Cardiology   Patient was given the opportunity to ask questions.  Patient verbalized understanding of the plan and was able to repeat key elements of the plan. Patient was given clear instructions to go to Emergency Department or return to medical center if symptoms don't improve, worsen, or new problems develop.The patient verbalized understanding.   Orders Placed This Encounter  Procedures   Vitamin B12   Vitamin D, 25-hydroxy   Ambulatory referral to ENT   Ambulatory referral to Cardiology   Follow-up with primary provider as scheduled.   Rema Fendt, NP

## 2023-04-09 ENCOUNTER — Encounter: Payer: Self-pay | Admitting: Family

## 2023-04-09 ENCOUNTER — Encounter: Payer: Self-pay | Admitting: Internal Medicine

## 2023-04-09 ENCOUNTER — Ambulatory Visit (INDEPENDENT_AMBULATORY_CARE_PROVIDER_SITE_OTHER): Payer: BLUE CROSS/BLUE SHIELD | Admitting: Family

## 2023-04-09 ENCOUNTER — Other Ambulatory Visit: Payer: Self-pay

## 2023-04-09 VITALS — BP 166/85 | HR 132 | Temp 98.6°F | Ht 64.0 in | Wt 322.0 lb

## 2023-04-09 DIAGNOSIS — R000 Tachycardia, unspecified: Secondary | ICD-10-CM

## 2023-04-09 DIAGNOSIS — I10 Essential (primary) hypertension: Secondary | ICD-10-CM | POA: Diagnosis not present

## 2023-04-09 DIAGNOSIS — K219 Gastro-esophageal reflux disease without esophagitis: Secondary | ICD-10-CM

## 2023-04-09 DIAGNOSIS — K140 Glossitis: Secondary | ICD-10-CM

## 2023-04-09 DIAGNOSIS — K146 Glossodynia: Secondary | ICD-10-CM

## 2023-04-09 MED ORDER — PANTOPRAZOLE SODIUM 40 MG PO TBEC: DELAYED_RELEASE_TABLET | ORAL | 0 refills | Status: DC

## 2023-04-09 NOTE — Assessment & Plan Note (Signed)
Do not think this is penumonia. Consider CXR if she fails to improve

## 2023-04-09 NOTE — Patient Instructions (Signed)
Glossitis Glossitis is inflammation of the tongue. This may be a stand-alone condition, or it may be a symptom of a different condition. Generally, glossitis goes away when its cause is found and treated. Glossitis can be dangerous if it causes difficulty breathing. What are the causes? This condition may be caused by many different things. Common causes include: Viral, bacterial, or yeast infections. Allergies. Disorders that affect the skin and mucous membranes, such as certain autoimmune disorders. Abnormal tissue growths (tumors). Lack of healthy red blood cells (anemia). Gastroesophageal reflux. This is when acid from the stomach flows up into the esophagus, or the part of the body that moves food from the mouth to the stomach. Lack of proper nutrition or certain vitamins. Certain lifelong (chronic) medical conditions, such as diabetes. Sometimes, glossitis may not be caused by an underlying condition. In these cases, glossitis may be caused by: Use of tobacco products, such as cigarettes, chewing tobacco, or e-cigarettes. Excessive alcohol use. Tongue injury or irritation. Certain medicines, such as medicines to treat cancer. In some cases, the cause is not known. What increases the risk? The following factors may make you more likely to develop this condition: Being a man. Taking antibiotics or steroids, such as asthma medicines. Drinking alcohol excessively. Using tobacco products, such as cigarettes, chewing tobacco, or e-cigarettes. Having a chronic medical condition, such as an immune disease or cancer. Not brushing or flossing your teeth regularly. Being anemic or not getting proper nutrition. What are the signs or symptoms? Symptoms of this condition vary depending on the cause. Symptoms may include: Swelling of the tongue. Pain and tenderness in the tongue. Sometimes, this condition is painless. Changes in tongue color. The tongue may be pale or bright red. Smooth areas  on the tongue's surface. A small mass of tissue (node) or white patch on the tongue. Trouble chewing, swallowing, or talking. Trouble breathing. How is this diagnosed? This condition is diagnosed based on a physical exam and medical history. Your health care provider may ask you about your eating and drinking habits. You may also have tests, including: Blood tests. Removal of a small amount of cells from the tongue that are examined under a microscope (biopsy). You may be given the name of a dentist or a health care provider who specializes in ear, nose, and throat (ENT) problems (otolaryngologist). How is this treated? Treatment for this condition depends on the cause and may include: Following instructions from your health care provider, such as: Keeping your mouth clean. Avoiding irritants that may have caused your condition or made it worse. Nutritional therapy. Your health care provider may tell you to change your eating and drinking habits or take a nutritional supplement. Managing any underlying conditions that may have caused your glossitis. Medicines, such as: Corticosteroids to reduce inflammation. Antibiotics if your condition was caused by a bacterial infection. Medicines that numb your tongue or mouth (local anesthetics). Follow these instructions at home: Eating and drinking  Eat healthy foods. Follow instructions from your health care provider about eating or drinking restrictions. If you drink alcohol, limit how much you have to: 0-1 drink a day for women who are not pregnant. 0-2 drinks a day for men. Know how much alcohol is in your drink. In the [place] one drink equals one 12 oz bottle of beer (355 mL), one 5 oz glass of wine (148 mL), or one 1 oz glass of hard liquor (44 mL). Mouth care  Keep your teeth and mouth clean. This includes brushing and   flossing frequently and having regular dental checkups. If you wear dentures or dental braces, work with your dentist to  make sure they fit correctly. Follow other instructions from your health care provider about how to take care of your mouth. He or she may recommend that you: Gently brush your tongue. Gargle with a mixture of salt and water 3-4 times a day or as needed. To make salt water, completely dissolve -1 tsp (3-6 g) of salt in 1 cup (237 mL) of warm water. Avoid any irritants that may have caused your condition or made it worse, such as chemicals or certain foods. Avoid breath mints, antibacterial mouthwash, and chewing gum. General instructions  Do not use any products that contain nicotine or tobacco. These products include cigarettes, chewing tobacco, and vaping devices, such as e-cigarettes. If you need help quitting, ask your health care provider. Take over-the-counter and prescription medicines only as told by your health care provider. Take supplements only as told by your health care provider. Follow the directions carefully. Keep all follow-up visits. This is important. Contact a health care provider if: You have a fever or chills. You develop new symptoms. You have symptoms that do not get better with medicine or get worse. You have symptoms that do not go away after 10 days. You cannot eat or drink due to pain. Get help right away if: You have severe pain or swelling. You have trouble breathing, swallowing, or talking. Summary Glossitis is inflammation of the tongue. It can be caused by many different things. Glossitis generally goes away when its cause is found and treated. Keep your teeth and mouth clean. This includes brushing and flossing frequently and having regular dental checkups. Eat healthy foods. Follow instructions from your health care provider about eating or drinking restrictions. This information is not intended to replace advice given to you by your health care provider. Make sure you discuss any questions you have with your health care provider. Document Revised:  05/19/2021 Document Reviewed: 05/19/2021 Elsevier Patient Education  2023 Elsevier Inc.  

## 2023-04-09 NOTE — Assessment & Plan Note (Addendum)
Current acute meds are appropriate, She needs to finish them ans directed and f/u with Dr Sherene Sires if she doesn't need to get back in touch sooner. Plan- D-dimer, BMET to exclude PE

## 2023-04-09 NOTE — Progress Notes (Signed)
Tongue X1 week, unable to taste.  Reports that med (Nystatin)  is not helping.

## 2023-04-10 ENCOUNTER — Other Ambulatory Visit: Payer: Self-pay | Admitting: Family

## 2023-04-10 ENCOUNTER — Encounter: Payer: Self-pay | Admitting: Family

## 2023-04-10 ENCOUNTER — Telehealth: Payer: Self-pay | Admitting: Family

## 2023-04-10 DIAGNOSIS — K140 Glossitis: Secondary | ICD-10-CM

## 2023-04-10 DIAGNOSIS — R000 Tachycardia, unspecified: Secondary | ICD-10-CM

## 2023-04-10 DIAGNOSIS — E559 Vitamin D deficiency, unspecified: Secondary | ICD-10-CM

## 2023-04-10 DIAGNOSIS — K146 Glossodynia: Secondary | ICD-10-CM

## 2023-04-10 DIAGNOSIS — I10 Essential (primary) hypertension: Secondary | ICD-10-CM

## 2023-04-10 LAB — VITAMIN D 25 HYDROXY (VIT D DEFICIENCY, FRACTURES): Vit D, 25-Hydroxy: 6.1 ng/mL — ABNORMAL LOW (ref 30.0–100.0)

## 2023-04-10 LAB — VITAMIN B12: Vitamin B-12: 421 pg/mL (ref 232–1245)

## 2023-04-10 MED ORDER — METOPROLOL SUCCINATE ER 25 MG PO TB24: 25.0000 mg | ORAL_TABLET | Freq: Two times a day (BID) | ORAL | 2 refills | Status: DC

## 2023-04-10 MED ORDER — VITAMIN D (ERGOCALCIFEROL) 1.25 MG (50000 UNIT) PO CAPS: 50000.0000 [IU] | ORAL_CAPSULE | ORAL | 2 refills | Status: AC

## 2023-04-10 MED ORDER — LIDOCAINE VISCOUS HCL 2 % MT SOLN: 15.0000 mL | Freq: Four times a day (QID) | OROMUCOSAL | 0 refills | Status: DC | PRN

## 2023-04-10 NOTE — Telephone Encounter (Signed)
Patient states that the rx was originally sent to the wrong pharmacy. Please resend to pharmacy below.  Medication Refill - Medication: metoprolol succinate (TOPROL-XL) 25 MG 24 hr tablet   Preferred Pharmacy (with phone number or street name):  [id number] - [place], [place] - [address] AT [facility] [address] [facility] [address] Phone: [phone]  Fax: [phone]     Has the patient been seen for an appointment in the last year OR does the patient have an upcoming appointment? Yes.    Agent: Please be advised that RX refills may take up to 3 business days. We ask that you follow-up with your pharmacy.

## 2023-04-10 NOTE — Telephone Encounter (Signed)
Complete. Metoprolol increased from 25 mg daily to 25 mg twice daily.

## 2023-04-10 NOTE — Telephone Encounter (Signed)
Lidocaine solution prescribed.

## 2023-04-10 NOTE — Telephone Encounter (Signed)
Please advise patient.  

## 2023-04-11 ENCOUNTER — Other Ambulatory Visit: Payer: Self-pay

## 2023-04-11 DIAGNOSIS — R000 Tachycardia, unspecified: Secondary | ICD-10-CM

## 2023-04-11 DIAGNOSIS — I10 Essential (primary) hypertension: Secondary | ICD-10-CM

## 2023-04-11 MED ORDER — METOPROLOL SUCCINATE ER 25 MG PO TB24: 25.0000 mg | ORAL_TABLET | Freq: Two times a day (BID) | ORAL | 2 refills | Status: DC

## 2023-04-11 NOTE — Telephone Encounter (Signed)
Request completed.

## 2023-04-17 ENCOUNTER — Encounter: Payer: Self-pay | Admitting: Orthopedic Surgery

## 2023-04-17 DIAGNOSIS — M5416 Radiculopathy, lumbar region: Secondary | ICD-10-CM

## 2023-04-20 ENCOUNTER — Telehealth: Payer: BLUE CROSS/BLUE SHIELD | Admitting: Family Medicine

## 2023-04-20 DIAGNOSIS — R112 Nausea with vomiting, unspecified: Secondary | ICD-10-CM

## 2023-04-20 MED ORDER — ONDANSETRON 4 MG PO TBDP: 4.0000 mg | ORAL_TABLET | Freq: Three times a day (TID) | ORAL | 0 refills | Status: DC | PRN

## 2023-04-20 NOTE — Progress Notes (Signed)
E-Visit for Nausea and Vomiting   We are sorry that you are not feeling well. Here is how we plan to help!  Based on what you have shared with me it looks like you have a Virus that is irritating your GI tract.  Vomiting is the forceful emptying of a portion of the stomach's content through the mouth.  Although nausea and vomiting can make you feel miserable, it's important to remember that these are not diseases, but rather symptoms of an underlying illness.  When we treat short term symptoms, we always caution that any symptoms that persist should be fully evaluated in a medical office.  I have prescribed a medication that will help alleviate your symptoms and allow you to stay hydrated:  Zofran odt  HOME CARE: Drink clear liquids.  This is very important! Dehydration (the lack of fluid) can lead to a serious complication.  Start off with 1 tablespoon every 5 minutes for 8 hours. You may begin eating bland foods after 8 hours without vomiting.  Start with saltine crackers, white bread, rice, mashed potatoes, applesauce. After 48 hours on a bland diet, you may resume a normal diet. Try to go to sleep.  Sleep often empties the stomach and relieves the need to vomit.  GET HELP RIGHT AWAY IF:  Your symptoms do not improve or worsen within 2 days after treatment. You have a fever for over 3 days. You cannot keep down fluids after trying the medication.  MAKE SURE YOU:  Understand these instructions. Will watch your condition. Will get help right away if you are not doing well or get worse.    Thank you for choosing an e-visit.  Your e-visit answers were reviewed by a board certified advanced clinical practitioner to complete your personal care plan. Depending upon the condition, your plan could have included both over the counter or prescription medications.  Please review your pharmacy choice. Make sure the pharmacy is open so you can pick up prescription now. If there is a problem,  you may contact your provider through [employer] and have the prescription routed to another pharmacy.  Your safety is important to [place]. If you have drug allergies check your prescription carefully.   For the next 24 hours you can use MyChart to ask questions about today's visit, request a non-urgent call back, or ask for a work or school excuse. You will get an email in the next two days asking about your experience. I hope that your e-visit has been valuable and will speed your recovery.    have provided 5 minutes of non face to face time during this encounter for chart review and documentation.

## 2023-04-22 ENCOUNTER — Encounter: Payer: Self-pay | Admitting: Family Medicine

## 2023-04-22 ENCOUNTER — Ambulatory Visit (INDEPENDENT_AMBULATORY_CARE_PROVIDER_SITE_OTHER): Payer: BLUE CROSS/BLUE SHIELD | Admitting: Family Medicine

## 2023-04-22 VITALS — BP 136/100 | HR 125 | Temp 98.7°F | Resp 16 | Wt 315.4 lb

## 2023-04-22 DIAGNOSIS — H66012 Acute suppurative otitis media with spontaneous rupture of ear drum, left ear: Secondary | ICD-10-CM

## 2023-04-22 DIAGNOSIS — H9202 Otalgia, left ear: Secondary | ICD-10-CM

## 2023-04-22 DIAGNOSIS — H66015 Acute suppurative otitis media with spontaneous rupture of ear drum, recurrent, left ear: Secondary | ICD-10-CM

## 2023-04-22 MED ORDER — CEFDINIR 300 MG PO CAPS: 300.0000 mg | ORAL_CAPSULE | Freq: Two times a day (BID) | ORAL | 0 refills | Status: DC

## 2023-04-22 NOTE — Progress Notes (Unsigned)
Patient c/o left ear drainage that js been clear x 2days. Patient has not taking any OTC medication .

## 2023-04-23 ENCOUNTER — Ambulatory Visit: Payer: BLUE CROSS/BLUE SHIELD | Admitting: Family

## 2023-04-25 ENCOUNTER — Telehealth: Payer: Self-pay | Admitting: Pulmonary Disease

## 2023-04-25 ENCOUNTER — Encounter: Payer: Self-pay | Admitting: Family Medicine

## 2023-04-25 NOTE — Progress Notes (Signed)
Established Patient Office Visit  Subjective    Patient ID: Tina Manning, female    DOB: 10-02-1991  Age: 32 y.o. MRN: [id number]  CC:  Chief Complaint  Patient presents with   Ear Drainage    HPI Charles Niese presents for complaint of left ear pain with drainage. She does have an appt with ENT scheduled for follow up of this recurrent problem.    Outpatient Encounter Medications as of 04/22/2023  Medication Sig   albuterol (VENTOLIN HFA) 108 (90 Base) MCG/ACT inhaler INHALE 1 TO 2 PUFFS INTO THE LUNGS EVERY 6 HOURS AS NEEDED FOR WHEEZE OR SHORTNESS OF BREATH   Budeson-Glycopyrrol-Formoterol (BREZTRI AEROSPHERE) 160-9-4.8 MCG/ACT AERO Inhale 2 puffs into the lungs in the morning and at bedtime.   budesonide (PULMICORT) 0.5 MG/2ML nebulizer solution Take 2 mLs (0.5 mg total) by nebulization in the morning and at bedtime.   cefdinir (OMNICEF) 300 MG capsule Take 1 capsule (300 mg total) by mouth 2 (two) times daily.   ibuprofen (ADVIL) 800 MG tablet Take 1 tablet (800 mg total) by mouth every 8 (eight) hours as needed.   ipratropium-albuterol (DUONEB) 0.5-2.5 (3) MG/3ML SOLN Take 3 mLs by nebulization in the morning, at noon, in the evening, and at bedtime. (Patient taking differently: Take 3 mLs by nebulization every 4 (four) hours as needed (Asthma).)   lidocaine (XYLOCAINE) 2 % solution Use as directed 15 mLs in the mouth or throat every 6 (six) hours as needed for mouth pain.   metoprolol succinate (TOPROL-XL) 25 MG 24 hr tablet Take 1 tablet (25 mg total) by mouth 2 (two) times daily.   mometasone-formoterol (DULERA) 200-5 MCG/ACT AERO Take 2 puffs first thing in am and then another 2 puffs about 12 hours later.   montelukast (SINGULAIR) 10 MG tablet Take 1 tablet (10 mg total) by mouth at bedtime.   ondansetron (ZOFRAN-ODT) 4 MG disintegrating tablet Take 1 tablet (4 mg total) by mouth every 8 (eight) hours as needed for nausea or vomiting.   pantoprazole  (PROTONIX) 40 MG tablet TAKE 1 TABLET(40 MG) BY MOUTH DAILY   Vitamin D, Ergocalciferol, (DRISDOL) 1.25 MG (50000 UNIT) CAPS capsule Take 1 capsule (50,000 Units total) by mouth every 7 (seven) days for 12 doses.   zolpidem (AMBIEN CR) 12.5 MG CR tablet Take 1 tablet (12.5 mg total) by mouth at bedtime.   cyclobenzaprine (FLEXERIL) 10 MG tablet Take 1 tablet (10 mg total) by mouth 3 (three) times daily as needed for muscle spasms. (Patient not taking: Reported on 04/09/2023)   diclofenac Sodium (VOLTAREN ARTHRITIS PAIN) 1 % GEL Apply 2 g topically 4 (four) times daily. (Patient not taking: Reported on 04/09/2023)   triamcinolone cream (KENALOG) 0.1 % Apply 1 Application topically 2 (two) times daily. (Patient not taking: Reported on 04/09/2023)   [DISCONTINUED] nystatin (MYCOSTATIN) 100000 UNIT/ML suspension Take 5 mLs (500,000 Units total) by mouth 4 (four) times daily.   No facility-administered encounter medications on file as of 04/22/2023.    Past Medical History:  Diagnosis Date   ADHD (attention deficit hyperactivity disorder)    Anemia    Anxiety    Asthma    Complication of anesthesia    COVID 01/2022   out of work for a week   Endometriosis    GERD (gastroesophageal reflux disease)    Heart rate fast    takes Metoprolol   History of kidney stones    Pneumonia    PONV (postoperative nausea and vomiting)  Pre-diabetes    Tachycardia     Past Surgical History:  Procedure Laterality Date   CHOLECYSTECTOMY     DILITATION & CURRETTAGE/HYSTROSCOPY WITH NOVASURE ABLATION N/A 03/05/2022   Procedure: DILATATION & CURETTAGE/HYSTEROSCOPY WITH NOVASURE ABLATION;  Surgeon: Willodean Rosenthal, MD;  Location: [facility] OR;  Service: Gynecology;  Laterality: N/A;   OPEN REDUCTION INTERNAL FIXATION (ORIF) DISTAL RADIAL FRACTURE Right 05/15/2021   Procedure: OPEN REDUCTION INTERNAL FIXATION (ORIF)RIGHT  DISTAL RADIAL FRACTURE;  Surgeon: Cammy Copa, MD;  Location: [facility] OR;  Service:  Orthopedics;  Laterality: Right;   TYMPANOSTOMY TUBE PLACEMENT     as a baby    Family History  Problem Relation Age of Onset   Heart attack Mother    Hypertension Mother    Breast cancer Mother    Polycystic ovary syndrome Mother    Heart disease Father    Hypertension Father    Liver cancer Father     Social History   Socioeconomic History   Marital status: Married    Spouse name: Not on file   Number of children: 0   Years of education: Not on file   Highest education level: 5th grade  Occupational History   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    Birth control/protection: None  Other Topics Concern   Not on file  Social History Narrative   Not on file   Social Determinants of Health   Financial Resource Strain: Low Risk  (04/09/2023)   Overall Financial Resource Strain (CARDIA)    Difficulty of Paying Living Expenses: Not very hard  Food Insecurity: Food Insecurity Present (04/09/2023)   Hunger Vital Sign    Worried About Running Out of Food in the Last Year: Sometimes true    Ran Out of Food in the Last Year: Sometimes true  Transportation Needs: No Transportation Needs (04/09/2023)   PRAPARE - Administrator, Civil Service (Medical): No    Lack of Transportation (Non-Medical): No  Physical Activity: Unknown (04/09/2023)   Exercise Vital Sign    Days of Exercise per Week: 0 days    Minutes of Exercise per Session: Not on file  Stress: Stress Concern Present (04/09/2023)   [employer] of Occupational Health - Occupational Stress Questionnaire    Feeling of Stress : Very much  Social Connections: Socially Isolated (04/09/2023)   Social Connection and Isolation Panel [NHANES]    Frequency of Communication with Friends and Family: Never    Frequency  of Social Gatherings with Friends and Family: Never    Attends Religious Services: Never    Database administrator or Organizations: No    Attends Engineer, structural: Not on file    Marital Status: Married  Catering manager Violence: Not on file    Review of Systems  Constitutional:  Negative for chills and fever.  HENT:  Positive for ear discharge and ear pain.   All other systems reviewed and are negative.       Objective    BP (!) 136/100   Pulse (!) 125   Temp 98.7 F (37.1 C) (Oral)   Resp 16   Wt (!) 315 lb 6.4 oz (143.1 kg)   SpO2 98%   BMI 54.14 kg/m  Physical Exam Vitals and nursing note reviewed.  Constitutional:      General: She is not in acute distress.    Appearance: She is obese.  HENT:     Right Ear: Ear canal and external ear normal. A middle ear effusion is present.     Left Ear: External ear normal. Drainage present.  Cardiovascular:     Rate and Rhythm: Normal rate and regular rhythm.  Pulmonary:     Effort: Pulmonary effort is normal.     Breath sounds: Normal breath sounds.  Neurological:     General: No focal deficit present.     Mental Status: She is alert and oriented to person, place, and time.         Assessment & Plan:   1. Recurrent acute suppurative otitis media with spontaneous rupture of left tympanic membrane Omnicef prescribed. Keep scheduled appt with consultant for follow up.  Tylenol/nsaids prn.   2. Otalgia of left ear As above   Return if symptoms worsen or fail to improve.   Tommie Raymond, MD

## 2023-04-25 NOTE — Telephone Encounter (Signed)
PT moved from [place] and needs her [place] set to new Pharm..  Old Pharm: [facility] in [place]  New Pharm: [facility] @ [address]  Because it is controlled sub we need to facilitate move,

## 2023-04-26 NOTE — Progress Notes (Unsigned)
Patient ID: Tina Manning, female    DOB: March 07, 1991  MRN: [id number]  CC: Ear Infection Follow-Up   Subjective: Tina Manning is a 32 y.o. female who presents for ear infection follow-up.   Her concerns today include:  ENT - glossitis, tongue pain Cards - HTN/tachycardia   Patient Active Problem List   Diagnosis Date Noted   Vitamin D deficiency 04/10/2023   Back pain at L4-L5 level 12/29/2022   Chronic midline low back pain with left-sided sciatica 12/29/2022   Carpal tunnel syndrome, right upper limb 09/10/2022   CAP (community acquired pneumonia) 08/30/2022   Abnormal uterine bleeding (AUB) 12/15/2021   Hirsutism 12/15/2021   Snoring 12/05/2021   Insomnia 11/13/2021   Sciatic nerve pain, right 10/31/2021   Acute right-sided low back pain with right-sided sciatica 10/31/2021   ASCUS with positive high risk HPV cervical 09/13/2021   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   Hyperlipidemia 08/15/2021   Prediabetes 08/15/2021   Morbid obesity due to excess calories (HCC) 08/15/2021   Closed fracture of lower end of right radius with routine healing    Anxiety and depression 08/09/2011   Endometriosis 03/27/2009   Tachycardia 11/27/1998   Severe asthma with acute exacerbation 04/24/1994     Current Outpatient Medications on File Prior to Visit  Medication Sig Dispense Refill   albuterol (VENTOLIN HFA) 108 (90 Base) MCG/ACT inhaler INHALE 1 TO 2 PUFFS INTO THE LUNGS EVERY 6 HOURS AS NEEDED FOR WHEEZE OR SHORTNESS OF BREATH 6.7 g 3   Budeson-Glycopyrrol-Formoterol (BREZTRI AEROSPHERE) 160-9-4.8 MCG/ACT AERO Inhale 2 puffs into the lungs in the morning and at bedtime. 1 each 0   budesonide (PULMICORT) 0.5 MG/2ML nebulizer solution Take 2 mLs (0.5 mg total) by nebulization in the morning and at bedtime. 60 mL 5   cefdinir (OMNICEF) 300 MG capsule Take 1 capsule (300 mg total) by mouth 2 (two) times daily. 20 capsule 0   cyclobenzaprine (FLEXERIL) 10 MG tablet Take 1 tablet (10 mg  total) by mouth 3 (three) times daily as needed for muscle spasms. (Patient not taking: Reported on 04/09/2023) 30 tablet 0   diclofenac Sodium (VOLTAREN ARTHRITIS PAIN) 1 % GEL Apply 2 g topically 4 (four) times daily. (Patient not taking: Reported on 04/09/2023) 100 g 1   ibuprofen (ADVIL) 800 MG tablet Take 1 tablet (800 mg total) by mouth every 8 (eight) hours as needed. 30 tablet 2   ipratropium-albuterol (DUONEB) 0.5-2.5 (3) MG/3ML SOLN Take 3 mLs by nebulization in the morning, at noon, in the evening, and at bedtime. (Patient taking differently: Take 3 mLs by nebulization every 4 (four) hours as needed (Asthma).) 360 mL 2   lidocaine (XYLOCAINE) 2 % solution Use as directed 15 mLs in the mouth or throat every 6 (six) hours as needed for mouth pain. 100 mL 0   metoprolol succinate (TOPROL-XL) 25 MG 24 hr tablet Take 1 tablet (25 mg total) by mouth 2 (two) times daily. 60 tablet 2   mometasone-formoterol (DULERA) 200-5 MCG/ACT AERO Take 2 puffs first thing in am and then another 2 puffs about 12 hours later. 1 each 11   montelukast (SINGULAIR) 10 MG tablet Take 1 tablet (10 mg total) by mouth at bedtime. 30 tablet 11   ondansetron (ZOFRAN-ODT) 4 MG disintegrating tablet Take 1 tablet (4 mg total) by mouth every 8 (eight) hours as needed for nausea or vomiting. 20 tablet 0   pantoprazole (PROTONIX) 40 MG tablet TAKE 1 TABLET(40 MG) BY MOUTH DAILY 90 tablet  0   triamcinolone cream (KENALOG) 0.1 % Apply 1 Application topically 2 (two) times daily. (Patient not taking: Reported on 04/09/2023) 80 g 0   Vitamin D, Ergocalciferol, (DRISDOL) 1.25 MG (50000 UNIT) CAPS capsule Take 1 capsule (50,000 Units total) by mouth every 7 (seven) days for 12 doses. 4 capsule 2   zolpidem (AMBIEN CR) 12.5 MG CR tablet Take 1 tablet (12.5 mg total) by mouth at bedtime. 30 tablet 2   No current facility-administered medications on file prior to visit.    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    Birth control/protection: None  Other Topics Concern   Not on file  Social History Narrative   Not on file   Social Determinants of Health   Financial Resource Strain: Low Risk  (04/09/2023)   Overall Financial Resource Strain (CARDIA)    Difficulty of Paying Living Expenses: Not very hard  Food Insecurity: Food Insecurity Present (04/09/2023)   Hunger Vital Sign    Worried About Running Out of Food in the Last Year: Sometimes true    Ran Out of Food in the Last Year: Sometimes true  Transportation Needs: No Transportation Needs (04/09/2023)   PRAPARE - Administrator, Civil Service (Medical): No    Lack of Transportation (Non-Medical): No  Physical Activity: Unknown (04/09/2023)   Exercise Vital Sign    Days of Exercise per Week: 0 days    Minutes of Exercise per Session: Not on file  Stress: Stress Concern Present (04/09/2023)   [employer] of Occupational Health - Occupational Stress Questionnaire    Feeling of Stress : Very much  Social Connections: Socially Isolated (04/09/2023)   Social Connection and Isolation Panel [NHANES]    Frequency of Communication with Friends and Family: Never    Frequency of Social Gatherings with Friends and Family: Never    Attends Religious Services: Never    Database administrator or Organizations: No    Attends Engineer, structural: Not on file    Marital Status: Married  Catering manager  Violence: Not on file    Family History  Problem Relation Age of Onset   Heart attack Mother    Hypertension Mother    Breast cancer Mother    Polycystic ovary syndrome Mother    Heart disease Father    Hypertension Father    Liver cancer Father     Past Surgical History:  Procedure Laterality Date   CHOLECYSTECTOMY     DILITATION & CURRETTAGE/HYSTROSCOPY WITH NOVASURE ABLATION N/A 03/05/2022   Procedure: DILATATION & CURETTAGE/HYSTEROSCOPY WITH NOVASURE ABLATION;  Surgeon: Willodean Rosenthal, MD;  Location: [facility] OR;  Service: Gynecology;  Laterality: N/A;   OPEN REDUCTION INTERNAL FIXATION (ORIF) DISTAL RADIAL FRACTURE Right 05/15/2021   Procedure: OPEN REDUCTION INTERNAL FIXATION (ORIF)RIGHT  DISTAL RADIAL FRACTURE;  Surgeon: August Saucer,  Corrie Mckusick, MD;  Location: [facility] OR;  Service: Orthopedics;  Laterality: Right;   TYMPANOSTOMY TUBE PLACEMENT     as a baby    ROS: Review of Systems Negative except as stated above  PHYSICAL EXAM: There were no vitals taken for this visit.  Physical Exam  {female adult master:310786} {female adult master:310785}     Latest Ref Rng & Units 03/12/2023   12:01 AM 03/05/2023    3:59 PM 11/14/2022    3:16 PM  CMP  Glucose 70 - 99 mg/dL 161  096  84   BUN 6 - 20 mg/dL 11  17  11    Creatinine 0.44 - 1.00 mg/dL 0.45  4.09  8.11   Sodium 135 - 145 mmol/L 138  138  140   Potassium 3.5 - 5.1 mmol/L 3.8  3.7  4.6   Chloride 98 - 111 mmol/L 102  106  102   CO2 22 - 32 mmol/L 25  23  21    Calcium 8.9 - 10.3 mg/dL 9.2  8.8  91.4    Lipid Panel     Component Value Date/Time   CHOL 211 (H) 08/14/2021 1724   TRIG 188 (H) 08/14/2021 1724   HDL 38 (L) 08/14/2021 1724   CHOLHDL 5.6 (H) 08/14/2021 1724   LDLCALC 139 (H) 08/14/2021 1724    CBC    Component Value Date/Time   WBC 19.5 (H) 03/12/2023 0001   RBC 5.05 03/12/2023 0001   HGB 13.2 03/12/2023 0001   HGB 13.0 11/14/2022 1516   HCT 41.5 03/12/2023 0001   HCT 41.7 11/14/2022 1516   PLT  522 (H) 03/12/2023 0001   PLT 493 (H) 11/14/2022 1516   MCV 82.2 03/12/2023 0001   MCV 79 11/14/2022 1516   MCH 26.1 03/12/2023 0001   MCHC 31.8 03/12/2023 0001   RDW 15.9 (H) 03/12/2023 0001   RDW 15.6 (H) 11/14/2022 1516   LYMPHSABS 2.3 11/14/2022 1516   MONOABS 0.7 11/13/2021 0949   EOSABS 0.2 11/14/2022 1516   BASOSABS 0.1 11/14/2022 1516    ASSESSMENT AND PLAN:  There are no diagnoses linked to this encounter.   Patient was given the opportunity to ask questions.  Patient verbalized understanding of the plan and was able to repeat key elements of the plan. Patient was given clear instructions to go to Emergency Department or return to medical center if symptoms don't improve, worsen, or new problems develop.The patient verbalized understanding.   No orders of the defined types were placed in this encounter.    Requested Prescriptions    No prescriptions requested or ordered in this encounter    No follow-ups on file.  Rema Fendt, NP

## 2023-04-26 NOTE — Telephone Encounter (Signed)
Called and spoke with pt who states new pharmacy is [facility] off [address]. I have made this pt's main pharmacy.   Pt is needing to have her ambien refilled. Dr. Val Eagle, please advise.

## 2023-04-29 ENCOUNTER — Other Ambulatory Visit: Payer: Self-pay | Admitting: Pulmonary Disease

## 2023-04-29 MED ORDER — ZOLPIDEM TARTRATE ER 12.5 MG PO TBCR: 12.5000 mg | EXTENDED_RELEASE_TABLET | Freq: Every day | ORAL | 2 refills | Status: DC

## 2023-04-29 NOTE — Telephone Encounter (Signed)
Medication refill addressed

## 2023-04-29 NOTE — Telephone Encounter (Signed)
ATC X1 unable to leave VM. Will call back later. Please advise Ambien has been sent to [facility] on [address]

## 2023-04-30 ENCOUNTER — Encounter: Payer: BLUE CROSS/BLUE SHIELD | Admitting: Family

## 2023-04-30 NOTE — Telephone Encounter (Signed)
Attemtped to call pt but unable to reach and unable to leave VM as mailbox is full.  Called pt's pharmacy to see if pt picked up Rx since it had been sent in for her by Dr. Val Eagle. Per pharmacy staff, pt did pick Ambien up from the pharmacy 5/6. Nothing further needed.

## 2023-05-04 ENCOUNTER — Telehealth: Payer: BLUE CROSS/BLUE SHIELD | Admitting: Family

## 2023-05-04 DIAGNOSIS — R202 Paresthesia of skin: Secondary | ICD-10-CM

## 2023-05-05 NOTE — Progress Notes (Signed)
Given your symptoms of tingling, I feel like it would be best to be seen in person to be evaluated.    NOTE: There will be NO CHARGE for this eVisit   If you are having a true medical emergency please call 911.      For an urgent face to face visit, [facility] has eight urgent care centers for your convenience:   NEW!! [facility] Health Urgent Care Center at [facility] Get Driving Directions [phone] [address] [place], [address]    [facility] Health Urgent Care Center at [facility] Get Driving Directions [phone] [address] [place], [place] [address]   [facility] Health Urgent Care Center [facility]) Get Driving Directions [phone] [address] [place], [place] [address]  [facility] Health Urgent Care Center [facility]) Get Driving Directions [phone] [address] [place],  [place]  [address]  [facility] Health Urgent Care Center [facility] - at [employer]  [phone] [address] [place],  [place] [address]   [facility] Health Urgent Care at [facility] Get Driving Directions [phone] [address], [address] [place], [place] [address]   [facility] Health Urgent Care at [facility] Get Driving Directions  [phone] [address] [place], [place] [address]   [facility] Health Urgent Care at [facility] Directions [phone] [address], [address] [place], [place] [address]  Your MyChart E-visit questionnaire answers were reviewed by a board certified advanced clinical practitioner to complete your personal care plan based on your specific symptoms.  Thank you for using e-Visits.

## 2023-05-09 ENCOUNTER — Telehealth: Payer: BLUE CROSS/BLUE SHIELD | Admitting: Family Medicine

## 2023-05-09 DIAGNOSIS — J069 Acute upper respiratory infection, unspecified: Secondary | ICD-10-CM | POA: Diagnosis not present

## 2023-05-10 MED ORDER — BENZONATATE 200 MG PO CAPS: 200.0000 mg | ORAL_CAPSULE | Freq: Two times a day (BID) | ORAL | 0 refills | Status: DC | PRN

## 2023-05-10 NOTE — Progress Notes (Signed)
E-Visit for Cough  We are sorry that you are not feeling well.  Here is how we plan to help!  Based on your presentation I believe you most likely have A cough due to a virus.  This is called viral bronchitis and is best treated by rest, plenty of fluids and control of the cough.  You may use Ibuprofen or Tylenol as directed to help your symptoms.     In addition you may use A prescription cough medication called Tessalon Perles 100mg . You may take 1-2 capsules every 8 hours as needed for your cough.    From your responses in the eVisit questionnaire you describe inflammation in the upper respiratory tract which is causing a significant cough.  This is commonly called Bronchitis and has four common causes:   Allergies Viral Infections Acid Reflux Bacterial Infection Allergies, viruses and acid reflux are treated by controlling symptoms or eliminating the cause. An example might be a cough caused by taking certain blood pressure medications. You stop the cough by changing the medication. Another example might be a cough caused by acid reflux. Controlling the reflux helps control the cough.  USE OF BRONCHODILATOR ("RESCUE") INHALERS: There is a risk from using your bronchodilator too frequently.  The risk is that over-reliance on a medication which only relaxes the muscles surrounding the breathing tubes can reduce the effectiveness of medications prescribed to reduce swelling and congestion of the tubes themselves.  Although you feel brief relief from the bronchodilator inhaler, your asthma may actually be worsening with the tubes becoming more swollen and filled with mucus.  This can delay other crucial treatments, such as oral steroid medications. If you need to use a bronchodilator inhaler daily, several times per day, you should discuss this with your provider.  There are probably better treatments that could be used to keep your asthma under control.     HOME CARE Only take medications as  instructed by your medical team. Complete the entire course of an antibiotic. Drink plenty of fluids and get plenty of rest. Avoid close contacts especially the very young and the elderly Cover your mouth if you cough or cough into your sleeve. Always remember to wash your hands A steam or ultrasonic humidifier can help congestion.   GET HELP RIGHT AWAY IF: You develop worsening fever. You become short of breath You cough up blood. Your symptoms persist after you have completed your treatment plan MAKE SURE YOU  Understand these instructions. Will watch your condition. Will get help right away if you are not doing well or get worse.    Thank you for choosing an e-visit.  Your e-visit answers were reviewed by a board certified advanced clinical practitioner to complete your personal care plan. Depending upon the condition, your plan could have included both over the counter or prescription medications.  Please review your pharmacy choice. Make sure the pharmacy is open so you can pick up prescription now. If there is a problem, you may contact your provider through [employer] and have the prescription routed to another pharmacy.  Your safety is important to [place]. If you have drug allergies check your prescription carefully.   For the next 24 hours you can use MyChart to ask questions about today's visit, request a non-urgent call back, or ask for a work or school excuse. You will get an email in the next two days asking about your experience. I hope that your e-visit has been valuable and will speed your recovery.  have provided 5 minutes of non face to face time during this encounter for chart review and documentation.

## 2023-05-11 ENCOUNTER — Ambulatory Visit
Admission: EM | Admit: 2023-05-11 | Discharge: 2023-05-11 | Disposition: A | Discharge status: Home / Self Care | Payer: BLUE CROSS/BLUE SHIELD | Attending: Internal Medicine | Admitting: Internal Medicine

## 2023-05-11 DIAGNOSIS — U071 COVID-19: Secondary | ICD-10-CM | POA: Diagnosis not present

## 2023-05-11 DIAGNOSIS — J029 Acute pharyngitis, unspecified: Secondary | ICD-10-CM | POA: Diagnosis present

## 2023-05-11 DIAGNOSIS — R059 Cough, unspecified: Secondary | ICD-10-CM | POA: Insufficient documentation

## 2023-05-11 DIAGNOSIS — J069 Acute upper respiratory infection, unspecified: Secondary | ICD-10-CM

## 2023-05-11 LAB — POCT INFLUENZA A/B
Influenza A, POC: NEGATIVE
Influenza B, POC: NEGATIVE

## 2023-05-11 LAB — POCT RAPID STREP A (OFFICE): Rapid Strep A Screen: NEGATIVE

## 2023-05-11 NOTE — ED Triage Notes (Signed)
Pt c/o fever at home, body aches, malaise, fatigue   Onset ~ 3 days ago

## 2023-05-11 NOTE — ED Provider Notes (Signed)
[facility]    CSN: [id number] Arrival date & time: 05/11/23  1414      History   Chief Complaint Chief Complaint  Patient presents with   Fever    HPI Tina Manning is a 32 y.o. female.   Patient presents with 3-day history of nasal congestion, cough, sore throat, body aches, fever.  Reports cough is productive.  Reports history of asthma and has been using albuterol nebulizer treatment with improvement in symptoms.  Denies any current chest pain or shortness of breath.  Patient reports Tmax at home was 101.  Has taken Tylenol for symptoms.  Patient had an e-visit yesterday and was prescribed benzonatate for cough.   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   Carpal tunnel syndrome, right upper limb 09/10/2022   CAP (community acquired pneumonia) 08/30/2022   Abnormal uterine bleeding (AUB) 12/15/2021   Hirsutism 12/15/2021   Snoring 12/05/2021   Insomnia 11/13/2021   Sciatic nerve pain, right 10/31/2021   Acute right-sided low back pain with right-sided sciatica 10/31/2021   ASCUS with positive high risk HPV cervical 09/13/2021   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   Hyperlipidemia 08/15/2021   Prediabetes 08/15/2021   Morbid obesity due to excess calories (HCC) 08/15/2021   Closed fracture of lower end of right radius with routine healing    Anxiety and depression 08/09/2011    Endometriosis 03/27/2009   Tachycardia 11/27/1998   Severe asthma with acute exacerbation 04/24/1994    Past Surgical History:  Procedure Laterality Date   CHOLECYSTECTOMY     DILITATION & CURRETTAGE/HYSTROSCOPY WITH NOVASURE ABLATION N/A 03/05/2022   Procedure: DILATATION & CURETTAGE/HYSTEROSCOPY WITH NOVASURE ABLATION;  Surgeon: Willodean Rosenthal, MD;  Location: [facility] OR;  Service: Gynecology;  Laterality: N/A;   OPEN REDUCTION INTERNAL FIXATION (ORIF) DISTAL RADIAL FRACTURE Right 05/15/2021   Procedure: OPEN REDUCTION INTERNAL FIXATION (ORIF)RIGHT  DISTAL RADIAL FRACTURE;  Surgeon: Cammy Copa, MD;  Location: [facility] OR;  Service: Orthopedics;  Laterality: Right;   TYMPANOSTOMY TUBE PLACEMENT     as a baby    OB History     Gravida  0   Para  0   Term  0   Preterm  0   AB  0   Living  0      SAB  0   IAB  0   Ectopic  0   Multiple  0   Live Births  0            Home Medications    Prior to Admission medications   Medication Sig Start Date End Date Taking? Authorizing Provider  albuterol (VENTOLIN HFA) 108 (90 Base) MCG/ACT inhaler INHALE 1 TO 2 PUFFS INTO THE LUNGS EVERY 6 HOURS AS NEEDED FOR WHEEZE OR SHORTNESS OF  BREATH 03/01/23   Nyoka Cowden, MD  benzonatate (TESSALON) 200 MG capsule Take 1 capsule (200 mg total) by mouth 2 (two) times daily as needed for cough. 05/10/23   Delorse Lek, FNP  Budeson-Glycopyrrol-Formoterol (BREZTRI AEROSPHERE) 160-9-4.8 MCG/ACT AERO Inhale 2 puffs into the lungs in the morning and at bedtime. 08/30/22   Cobb, Ruby Cola, NP  budesonide (PULMICORT) 0.5 MG/2ML nebulizer solution Take 2 mLs (0.5 mg total) by nebulization in the morning and at bedtime. 08/30/22   Cobb, Ruby Cola, NP  cefdinir (OMNICEF) 300 MG capsule Take 1 capsule (300 mg total) by mouth 2 (two) times daily. 04/22/23   Georganna Skeans, MD  cyclobenzaprine (FLEXERIL) 10 MG tablet Take 1 tablet (10 mg total) by mouth 3 (three) times daily as needed for  muscle spasms. Patient not taking: Reported on 04/09/2023 03/23/23   Delorse Lek, FNP  diclofenac Sodium (VOLTAREN ARTHRITIS PAIN) 1 % GEL Apply 2 g topically 4 (four) times daily. Patient not taking: Reported on 04/09/2023 11/14/22   Anders Simmonds, PA-C  ibuprofen (ADVIL) 800 MG tablet Take 1 tablet (800 mg total) by mouth every 8 (eight) hours as needed. 01/09/23   Tarry Kos, MD  ipratropium-albuterol (DUONEB) 0.5-2.5 (3) MG/3ML SOLN Take 3 mLs by nebulization in the morning, at noon, in the evening, and at bedtime. Patient taking differently: Take 3 mLs by nebulization every 4 (four) hours as needed (Asthma). 01/04/22   Rema Fendt, NP  lidocaine (XYLOCAINE) 2 % solution Use as directed 15 mLs in the mouth or throat every 6 (six) hours as needed for mouth pain. 04/10/23   Rema Fendt, NP  metoprolol succinate (TOPROL-XL) 25 MG 24 hr tablet Take 1 tablet (25 mg total) by mouth 2 (two) times daily. 04/11/23   Rema Fendt, NP  mometasone-formoterol (DULERA) 200-5 MCG/ACT AERO Take 2 puffs first thing in am and then another 2 puffs about 12 hours later. 08/15/21   Nyoka Cowden, MD  montelukast (SINGULAIR) 10 MG tablet Take 1 tablet (10 mg total) by mouth at bedtime. 09/12/21   Glenford Bayley, NP  ondansetron (ZOFRAN-ODT) 4 MG disintegrating tablet Take 1 tablet (4 mg total) by mouth every 8 (eight) hours as needed for nausea or vomiting. 04/20/23   Delorse Lek, FNP  pantoprazole (PROTONIX) 40 MG tablet TAKE 1 TABLET(40 MG) BY MOUTH DAILY 04/09/23   Rema Fendt, NP  triamcinolone cream (KENALOG) 0.1 % Apply 1 Application topically 2 (two) times daily. Patient not taking: Reported on 04/09/2023 10/18/22   Margaretann Loveless, PA-C  Vitamin D, Ergocalciferol, (DRISDOL) 1.25 MG (50000 UNIT) CAPS capsule Take 1 capsule (50,000 Units total) by mouth every 7 (seven) days for 12 doses. 04/10/23 06/27/23  Rema Fendt, NP  zolpidem (AMBIEN CR) 12.5 MG CR tablet Take 1 tablet (12.5 mg  total) by mouth at bedtime. 04/29/23   Tomma Lightning, MD    Family History Family History  Problem Relation Age of Onset   Heart attack Mother    Hypertension Mother    Breast cancer Mother    Polycystic ovary syndrome Mother    Heart disease Father    Hypertension Father    Liver cancer Father     Social History Social History   Tobacco Use   Smoking status: Never   Smokeless tobacco: Never  Vaping Use   Vaping Use: Never used  Substance Use Topics   Alcohol use: No   Drug  use: No     Allergies   Bee venom and Penicillins   Review of Systems Review of Systems Per HPI  Physical Exam Triage Vital Signs ED Triage Vitals  Enc Vitals Group     BP 05/11/23 1518 92/66     Pulse Rate 05/11/23 1518 (!) 105     Resp 05/11/23 1518 16     Temp 05/11/23 1518 98.6 F (37 C)     Temp Source 05/11/23 1518 Oral     SpO2 05/11/23 1518 97 %     Weight --      Height --      Head Circumference --      Peak Flow --      Pain Score 05/11/23 1519 6     Pain Loc --      Pain Edu? --      Excl. in GC? --    No data found.  Updated Vital Signs BP 92/66 (BP Location: Right Arm)   Pulse (!) 105   Temp 98.6 F (37 C) (Oral)   Resp 16   SpO2 97%   Visual Acuity Right Eye Distance:   Left Eye Distance:   Bilateral Distance:    Right Eye Near:   Left Eye Near:    Bilateral Near:     Physical Exam Constitutional:      General: She is not in acute distress.    Appearance: Normal appearance. She is not toxic-appearing or diaphoretic.  HENT:     Head: Normocephalic and atraumatic.     Right Ear: Tympanic membrane and ear canal normal.     Left Ear: Tympanic membrane and ear canal normal.     Nose: Congestion present.     Mouth/Throat:     Mouth: Mucous membranes are moist.     Pharynx: Posterior oropharyngeal erythema present.  Eyes:     Extraocular Movements: Extraocular movements intact.     Conjunctiva/sclera: Conjunctivae normal.     Pupils: Pupils are  equal, round, and reactive to light.  Cardiovascular:     Rate and Rhythm: Normal rate and regular rhythm.     Pulses: Normal pulses.     Heart sounds: Normal heart sounds.  Pulmonary:     Effort: Pulmonary effort is normal. No respiratory distress.     Breath sounds: Normal breath sounds. No stridor. No wheezing, rhonchi or rales.  Abdominal:     General: Abdomen is flat. Bowel sounds are normal.     Palpations: Abdomen is soft.  Musculoskeletal:        General: Normal range of motion.     Cervical back: Normal range of motion.  Skin:    General: Skin is warm and dry.  Neurological:     General: No focal deficit present.     Mental Status: She is alert and oriented to person, place, and time. Mental status is at baseline.  Psychiatric:        Mood and Affect: Mood normal.        Behavior: Behavior normal.      UC Treatments / Results  Labs (all labs ordered are listed, but only abnormal results are displayed) Labs Reviewed  CULTURE, GROUP A STREP ([facility])  SARS CORONAVIRUS 2 (TAT 6-24 HRS)  POCT RAPID STREP A (OFFICE)  POCT INFLUENZA A/B    EKG   Radiology No results found.  Procedures Procedures (including critical care time)  Medications Ordered in UC Medications - No data to display  Initial  Impression / Assessment and Plan / UC Course  I have reviewed the triage vital signs and the nursing notes.  Pertinent labs & imaging results that were available during my care of the patient were reviewed by me and considered in my medical decision making (see chart for details).     Patient presents with symptoms likely from a viral upper respiratory infection. Do not suspect underlying cardiopulmonary process. Symptoms seem unlikely related to ACS, CHF or COPD exacerbations, pneumonia, pneumothorax. Patient is nontoxic appearing and not in need of emergent medical intervention.  Rapid strep and rapid flu negative.  COVID test pending.  Do not have flu PCR capabilities  here in urgent care at this time.  No signs of asthma exacerbation on exam that would necessitate steroid therapy.  Advised to continue albuterol nebulizer treatment as needed.  Recommended symptom control with over the counter medications.  Advised supportive care, adequate fluid hydration, rest.  Return if symptoms fail to improve in 1-2 weeks or you develop shortness of breath, chest pain, severe headache. Patient states understanding and is agreeable.  Discharged with PCP followup.  Final Clinical Impressions(s) / UC Diagnoses   Final diagnoses:  Viral upper respiratory tract infection with cough  Sore throat     Discharge Instructions      Rapid strep and rapid flu were negative.  Throat culture and COVID test pending.  As we discussed, your symptoms appear viral in nature and should run its course.  Ensure adequate hydration and rest.  Follow-up if any symptoms persist or worsen.    ED Prescriptions   None    PDMP not reviewed this encounter.   Gustavus Bryant, [place] 05/11/23 [phone]

## 2023-05-11 NOTE — Discharge Instructions (Signed)
Rapid strep and rapid flu were negative.  Throat culture and COVID test pending.  As we discussed, your symptoms appear viral in nature and should run its course.  Ensure adequate hydration and rest.  Follow-up if any symptoms persist or worsen.

## 2023-05-12 ENCOUNTER — Telehealth: Payer: BLUE CROSS/BLUE SHIELD | Admitting: Family Medicine

## 2023-05-12 DIAGNOSIS — U071 COVID-19: Secondary | ICD-10-CM | POA: Diagnosis not present

## 2023-05-12 LAB — SARS CORONAVIRUS 2 (TAT 6-24 HRS): SARS Coronavirus 2: POSITIVE — AB

## 2023-05-12 MED ORDER — NIRMATRELVIR/RITONAVIR (PAXLOVID)TABLET: 3.0000 | ORAL_TABLET | Freq: Two times a day (BID) | ORAL | 0 refills | Status: AC

## 2023-05-12 MED ORDER — ONDANSETRON HCL 4 MG PO TABS: 4.0000 mg | ORAL_TABLET | Freq: Three times a day (TID) | ORAL | 0 refills | Status: DC | PRN

## 2023-05-12 MED ORDER — IBUPROFEN 800 MG PO TABS: 800.0000 mg | ORAL_TABLET | Freq: Three times a day (TID) | ORAL | 0 refills | Status: DC | PRN

## 2023-05-12 NOTE — Progress Notes (Signed)
See [id number]

## 2023-05-12 NOTE — Progress Notes (Signed)
Virtual Visit Consent   Tina Manning, you are scheduled for a virtual visit with a [facility] provider today. Just as with appointments in the office, your consent must be obtained to participate. Your consent will be active for this visit and any virtual visit you may have with one of our providers in the next 365 days. If you have a MyChart account, a copy of this consent can be sent to you electronically.  As this is a virtual visit, video technology does not allow for your provider to perform a traditional examination. This may limit your provider's ability to fully assess your condition. If your provider identifies any concerns that need to be evaluated in person or the need to arrange testing (such as labs, EKG, etc.), we will make arrangements to do so. Although advances in technology are sophisticated, we cannot ensure that it will always work on either your end or our end. If the connection with a video visit is poor, the visit may have to be switched to a telephone visit. With either a video or telephone visit, we are not always able to ensure that we have a secure connection.  By engaging in this virtual visit, you consent to the provision of healthcare and authorize for your insurance to be billed (if applicable) for the services provided during this visit. Depending on your insurance coverage, you may receive a charge related to this service.  I need to obtain your verbal consent now. Are you willing to proceed with your visit today? Julliet Mccraney has provided verbal consent on 05/12/2023 for a virtual visit (video or telephone). Georgana Curio, FNP  Date: 05/12/2023 9:33 AM  Virtual Visit via Video Note   I, Georgana Curio, connected with  Shea Mcbeth  ([id number], 1991/11/29) on 05/12/23 at  9:30 AM EDT by a video-enabled telemedicine application and verified that I am speaking with the correct person using two identifiers.  Location: Patient: Virtual Visit  Location Patient: Home Provider: Virtual Visit Location Provider: Home Office   I discussed the limitations of evaluation and management by telemedicine and the availability of in person appointments. The patient expressed understanding and agreed to proceed.    History of Present Illness: Tina Manning is a 32 y.o. who identifies as a female who was assigned female at birth, and is being seen today for covid positive testing at [facility] yesterday evening. Result came through this am however she was not able to get an answer on the phone. She has taken paxlovid in the past due to history of asthma and prediabetes. She is having fever, chills, aches, minimal cough, nausea and requests refill on ibuprofen Denies wheezing and sob. 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   Carpal tunnel syndrome, right upper limb 09/10/2022   CAP (community acquired pneumonia) 08/30/2022   Abnormal uterine bleeding (AUB) 12/15/2021   Hirsutism 12/15/2021   Snoring 12/05/2021   Insomnia 11/13/2021   Sciatic nerve pain, right 10/31/2021   Acute right-sided low back pain with right-sided sciatica 10/31/2021   ASCUS with positive high risk HPV cervical 09/13/2021   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   Hyperlipidemia 08/15/2021   Prediabetes 08/15/2021   Morbid obesity due to excess calories (HCC) 08/15/2021   Closed fracture of lower end of right radius with routine  healing    Anxiety and depression 08/09/2011   Endometriosis 03/27/2009   Tachycardia 11/27/1998   Severe asthma with acute exacerbation 04/24/1994    Allergies:  Allergies  Allergen Reactions   Bee Venom Anaphylaxis   Penicillins Nausea And Vomiting and Other (See Comments)    Intolerance    Medications:  Current Outpatient Medications:    nirmatrelvir/ritonavir (PAXLOVID)  20 x 150 MG & 10 x 100MG  TABS, Take 3 tablets by mouth 2 (two) times daily for 5 days. (Take nirmatrelvir 150 mg two tablets twice daily for 5 days and ritonavir 100 mg one tablet twice daily for 5 days) Patient GFR is >60, Disp: 30 tablet, Rfl: 0   ondansetron (ZOFRAN) 4 MG tablet, Take 1 tablet (4 mg total) by mouth every 8 (eight) hours as needed for nausea or vomiting., Disp: 20 tablet, Rfl: 0   albuterol (VENTOLIN HFA) 108 (90 Base) MCG/ACT inhaler, INHALE 1 TO 2 PUFFS INTO THE LUNGS EVERY 6 HOURS AS NEEDED FOR WHEEZE OR SHORTNESS OF BREATH, Disp: 6.7 g, Rfl: 3   benzonatate (TESSALON) 200 MG capsule, Take 1 capsule (200 mg total) by mouth 2 (two) times daily as needed for cough., Disp: 20 capsule, Rfl: 0   Budeson-Glycopyrrol-Formoterol (BREZTRI AEROSPHERE) 160-9-4.8 MCG/ACT AERO, Inhale 2 puffs into the lungs in the morning and at bedtime., Disp: 1 each, Rfl: 0   budesonide (PULMICORT) 0.5 MG/2ML nebulizer solution, Take 2 mLs (0.5 mg total) by nebulization in the morning and at bedtime., Disp: 60 mL, Rfl: 5   cefdinir (OMNICEF) 300 MG capsule, Take 1 capsule (300 mg total) by mouth 2 (two) times daily., Disp: 20 capsule, Rfl: 0   cyclobenzaprine (FLEXERIL) 10 MG tablet, Take 1 tablet (10 mg total) by mouth 3 (three) times daily as needed for muscle spasms. (Patient not taking: Reported on 04/09/2023), Disp: 30 tablet, Rfl: 0   diclofenac Sodium (VOLTAREN ARTHRITIS PAIN) 1 % GEL, Apply 2 g topically 4 (four) times daily. (Patient not taking: Reported on 04/09/2023), Disp: 100 g, Rfl: 1   ibuprofen (ADVIL) 800 MG tablet, Take 1 tablet (800 mg total) by mouth every 8 (eight) hours as needed., Disp: 30 tablet, Rfl: 0   ipratropium-albuterol (DUONEB) 0.5-2.5 (3) MG/3ML SOLN, Take 3 mLs by nebulization in the morning, at noon, in the evening, and at bedtime. (Patient taking differently: Take 3 mLs by nebulization every 4 (four) hours as needed (Asthma).), Disp: 360 mL, Rfl: 2   lidocaine (XYLOCAINE) 2 %  solution, Use as directed 15 mLs in the mouth or throat every 6 (six) hours as needed for mouth pain., Disp: 100 mL, Rfl: 0   metoprolol succinate (TOPROL-XL) 25 MG 24 hr tablet, Take 1 tablet (25 mg total) by mouth 2 (two) times daily., Disp: 60 tablet, Rfl: 2   mometasone-formoterol (DULERA) 200-5 MCG/ACT AERO, Take 2 puffs first thing in am and then another 2 puffs about 12 hours later., Disp: 1 each, Rfl: 11   montelukast (SINGULAIR) 10 MG tablet, Take 1 tablet (10 mg total) by mouth at bedtime., Disp: 30 tablet, Rfl: 11   ondansetron (ZOFRAN-ODT) 4 MG disintegrating tablet, Take 1 tablet (4 mg total) by mouth every 8 (eight) hours as needed for nausea or vomiting., Disp: 20 tablet, Rfl: 0   pantoprazole (PROTONIX) 40 MG tablet, TAKE 1 TABLET(40 MG) BY MOUTH DAILY, Disp: 90 tablet, Rfl: 0   triamcinolone cream (KENALOG) 0.1 %, Apply 1 Application topically 2 (two) times daily. (Patient not taking: Reported on 04/09/2023),  Disp: 80 g, Rfl: 0   Vitamin D, Ergocalciferol, (DRISDOL) 1.25 MG (50000 UNIT) CAPS capsule, Take 1 capsule (50,000 Units total) by mouth every 7 (seven) days for 12 doses., Disp: 4 capsule, Rfl: 2   zolpidem (AMBIEN CR) 12.5 MG CR tablet, Take 1 tablet (12.5 mg total) by mouth at bedtime., Disp: 30 tablet, Rfl: 2  Observations/Objective: Patient is well-developed, well-nourished in no acute distress.  Resting comfortably  at home.  Head is normocephalic, atraumatic.  No labored breathing.  Speech is clear and coherent with logical content.  Patient is alert and oriented at baseline.    Assessment and Plan: 1. COVID  Increase fluids, see educational information, UC if sx worsen.   Follow Up Instructions: I discussed the assessment and treatment plan with the patient. The patient was provided an opportunity to ask questions and all were answered. The patient agreed with the plan and demonstrated an understanding of the instructions.  A copy of instructions were sent to  the patient via MyChart unless otherwise noted below.     The patient was advised to call back or seek an in-person evaluation if the symptoms worsen or if the condition fails to improve as anticipated.  Time:  I spent 10 minutes with the patient via telehealth technology discussing the above problems/concerns.    Georgana Curio, FNP

## 2023-05-12 NOTE — Patient Instructions (Signed)
Quarantine and Isolation Quarantine and isolation are measures taken to protect the public from diseases that are a public health threat and that spread easily from person to person (are contagious). Isolation is when people who are sick are kept fully separate from people who are not sick. Quarantine is when people who have been exposed to a disease are kept away from people who are not sick. You may still need to quarantine or isolate even if you have gotten a vaccine for the disease. What diseases do I need to quarantine or isolate for? You may need to quarantine or isolate if you have been exposed to or diagnosed with: Severe acute respiratory syndromes, such as COVID-19. Cholera. Diphtheria. Tuberculosis. Plague. Smallpox. Yellow fever. Viral hemorrhagic fevers. These include [place], Ebola, and [place]. When should I quarantine? You should quarantine when: You have been in close contact with someone who has a contagious disease. Close contact means you have been less than 6 ft (1.8 m) away from the person for 15 minutes or more over a 24-hour period. When should I isolate? You should isolate when: You are sick with a contagious disease. You test positive for a contagious disease, even if you do not have symptoms. You are sick and think you may have a contagious disease. If you think you have a contagious disease: Get tested. If your test results are negative, you can end your isolation. If your test results are positive, isolate as told by your health care provider or local health officials. Follow these instructions at home: Medicines  Take over-the-counter and prescription medicines only as told by your provider. Finish your antibiotics even if you start to feel better. Stay up to date on your vaccines. Get vaccines and booster shots as recommended by your provider. Lifestyle When you are in quarantine or isolation: Wear a high-quality mask if you must be around others  at home and in public, as told by your provider. Do not go to places where you cannot wear a mask. Stay home and away from others as much as you can. Do not get close to people who may get very sick from the disease. Use a separate bathroom, if you can. Do not share personal items, like cups, towels, and utensils. Practice good hygiene. Improve the air flow (ventilation) in your home. This can help stop the disease from spreading to others. Activity Do not travel if you are in quarantine or isolation. Return to your normal activities as told by your provider. Ask your provider what activities are safe for you. General instructions Talk to your provider if you have a weak body's defense system (immune system). Your body may not respond as well to vaccines. You may need to wear a well-fitting mask, stay away from crowds, and avoid places that are not ventilated well. If you are sick, closely watch your symptoms. Follow any instructions from your provider. You may be told to rest, drink fluids, and take medicine. Follow any set guidelines for quarantine and isolation if you are in a place where a disease outbreak could occur. These places include correctional and detention centers, homeless shelters, and cruise ships. Where to find more information Centers for Disease Control and Prevention (CDC): cdc.gov Contact a health care provider if: You have a fever. Your symptoms come back or get worse after you isolate. Get help right away if: You have trouble breathing. You have chest pain. These symptoms may be an emergency. Get help right away. Call 911. Do not   wait to see if the symptoms will go away. Do not drive yourself to the hospital. This information is not intended to replace advice given to you by your health care provider. Make sure you discuss any questions you have with your health care provider. Document Revised: 08/24/2022 Document Reviewed: 08/24/2022 Elsevier Patient Education   2023 Elsevier Inc. COVID-19 COVID-19 is an infection caused by a virus called SARS-CoV-2. Most people who get COVID-19 have mild to moderate symptoms. Some have little to no symptoms. In others, the virus may cause a severe infection. What are the causes? COVID-19 is caused by a coronavirus. The virus may be in the air as droplets or as tiny specks of fluid (aerosols). It may also be on surfaces. You may catch the virus if you: Breathe in droplets when a person with COVID-19 breathes, speaks, sings, coughs, or sneezes. Touch something that has the virus on it and then touch your mouth, nose, or eyes. What increases the risk? Risk for infection: You are more likely to get COVID-19 if: You are within 6 ft (1.8 m) of a person who has COVID-19 for 15 minutes or longer. You provide care to a person who has COVID-19. You are in close contact with others. This includes hugging, kissing, or sharing utensils. Risk for serious illness caused by COVID-19: You are more likely to get very ill from COVID-19 if: You have cancer. You have a long-term (chronic) disease. This may be: A chronic lung disease, such as pulmonary embolism, chronic obstructive pulmonary disease (COPD), or cystic fibrosis. A disease that affects your body's defense system (immune system). If you have a weak immune system, you are said to be immunocompromised. A serious heart condition, such as heart failure, coronary artery disease, or cardiomyopathy. Diabetes. Chronic kidney disease. A liver disease, such as cirrhosis, nonalcoholic fatty liver disease, alcoholic liver disease, or autoimmune hepatitis. You are obese. You are pregnant or were just pregnant. You have sickle cell disease. What are the signs or symptoms? Symptoms of COVID-19 can range from mild to severe. They may appear any time from 2 to 14 days after you are exposed. They include: Fever or chills. Shortness of breath or trouble breathing. Feeling  tired. Headaches, body aches, or muscle aches. A runny or stuffy nose. Sneezing, coughing, or a sore throat. New loss of taste or smell. You may also have stomach problems, such as nausea, vomiting, or diarrhea. In some cases, you may not have any symptoms. How is this diagnosed? COVID-19 may be diagnosed by testing a sample to check for the virus. The most common tests are the PCR test and the antigen test. Tests may be done in the lab or at home. They include: Using a swab to take a sample of fluid from your nose. Testing a sample of saliva from your mouth. Testing a sample of mucus from your lungs (sputum). How is this treated? Treatment for COVID-19 depends on how severe your condition is. Mild symptoms can be treated at home. You should rest, drink fluids, and take over-the-counter medicine. If you have symptoms and risk factors, you may be prescribed a medicine that fights viruses (antiviral). Severe symptoms may be treated in a hospital intensive care unit (ICU). Treatment may include: Extra oxygen given through a tube in the nose, a face mask, or a hood. Medicines. These may include: Antivirals, such as remdesivir. Anti-inflammatories, such as corticosteroids. These help reduce inflammation. Antithrombotics. These help prevent or treat blood clots. Convalescent plasma. This helps boost   your immune system. Prone positioning. This is when you are laid on your stomach to help oxygen get into your lungs. Infection control measures. If you are at risk for a more serious illness, your health care provider may prescribe two medicines to help your immune system protect you. These are called long-acting monoclonal antibodies. They are given together every 6 months. How is this prevented? To protect yourself: Get the vaccine or vaccine series if you meet the guidelines. You can even get the vaccine while you are pregnant or making breast milk (lactating). Get an added dose of the vaccine  if you are immunocompromised. This applies if you have had an organ transplant or if you have a condition that affects your immune system. You should get the added dose 4 weeks after you got the first one. If you get an mRNA vaccine, you will need to get 3 doses. Talk to your provider about getting experimental monoclonal antibodies. This treatment can help prevent severe illness. It may be given to you if: You are immunocompromised. You cannot get the vaccine. You may not get the vaccine if you have a severe allergic reaction to it or to what it is made of. You are not fully vaccinated. You are in a place where there is COVID-19 and: You are in close contact with someone who has COVID-19. You are at high risk of being exposed. You are at risk of illness from new variants of the virus. To protect others: If you have symptoms of COVID-19, take steps to stop the virus from spreading. Stay home. Leave your house only to get medical care. Do not use public transit. Do not travel while you are sick. Wash your hands often with soap and water for at least 20 seconds. If soap and water are not available, use alcohol-based hand sanitizer. Make sure that all people in your household wash their hands well and often. Cough or sneeze into a tissue or your sleeve or elbow. Do not cough or sneeze into your hand or into the air. Where to find more information Centers for Disease Control and Prevention (CDC): cdc.gov World Health Organization (WHO): who.int Get help right away if: You have trouble breathing. You have pain or pressure in your chest. You are confused. Your lips or fingernails turn blue. You have trouble waking from sleep. Your symptoms get worse. These symptoms may be an emergency. Get help right away. Call 911. Do not wait to see if the symptoms will go away. Do not drive yourself to the hospital. This information is not intended to replace advice given to you by your health care  provider. Make sure you discuss any questions you have with your health care provider. Document Revised: 08/24/2022 Document Reviewed: 08/24/2022 Elsevier Patient Education  2023 Elsevier Inc.  

## 2023-05-13 ENCOUNTER — Telehealth: Payer: BLUE CROSS/BLUE SHIELD | Admitting: Family Medicine

## 2023-05-13 DIAGNOSIS — U071 COVID-19: Secondary | ICD-10-CM

## 2023-05-13 NOTE — Progress Notes (Signed)
[place]   Follow up question

## 2023-05-13 NOTE — Progress Notes (Unsigned)
Virtual Visit via Video Note  I connected with Tina Manning, on 05/13/2023 at 3:49 PM by video and verified that I am speaking with the correct person using two identifiers.  Consent: I discussed the limitations, risks, security and privacy concerns of performing an evaluation and management service by telephone and the availability of in person appointments. I also discussed with the patient that there may be a patient responsible charge related to this service. The patient expressed understanding and agreed to proceed.   Location of Patient: Home  Location of Provider: [facility]   Persons participating in Telemedicine visit: Tina Manning Ricky Stabs, NP NAME, CMA   History of Present Illness: Tina Manning is a 32 year old female who presents for Covid.    Past Medical History:  Diagnosis Date   ADHD (attention deficit hyperactivity disorder)    Anemia    Anxiety    Asthma    Complication of anesthesia    COVID 01/2022   out of work for a week   Endometriosis    GERD (gastroesophageal reflux disease)    Heart rate fast    takes Metoprolol   History of kidney stones    Pneumonia    PONV (postoperative nausea and vomiting)    Pre-diabetes    Tachycardia    Allergies  Allergen Reactions   Bee Venom Anaphylaxis   Penicillins Nausea And Vomiting and Other (See Comments)    Intolerance     Current Outpatient Medications on File Prior to Visit  Medication Sig Dispense Refill   albuterol (VENTOLIN HFA) 108 (90 Base) MCG/ACT inhaler INHALE 1 TO 2 PUFFS INTO THE LUNGS EVERY 6 HOURS AS NEEDED FOR WHEEZE OR SHORTNESS OF BREATH 6.7 g 3   benzonatate (TESSALON) 200 MG capsule Take 1 capsule (200 mg total) by mouth 2 (two) times daily as needed for cough. 20 capsule 0   Budeson-Glycopyrrol-Formoterol (BREZTRI AEROSPHERE) 160-9-4.8 MCG/ACT AERO Inhale 2 puffs into the lungs in the morning and at bedtime. 1 each 0    budesonide (PULMICORT) 0.5 MG/2ML nebulizer solution Take 2 mLs (0.5 mg total) by nebulization in the morning and at bedtime. 60 mL 5   cefdinir (OMNICEF) 300 MG capsule Take 1 capsule (300 mg total) by mouth 2 (two) times daily. 20 capsule 0   cyclobenzaprine (FLEXERIL) 10 MG tablet Take 1 tablet (10 mg total) by mouth 3 (three) times daily as needed for muscle spasms. (Patient not taking: Reported on 04/09/2023) 30 tablet 0   diclofenac Sodium (VOLTAREN ARTHRITIS PAIN) 1 % GEL Apply 2 g topically 4 (four) times daily. (Patient not taking: Reported on 04/09/2023) 100 g 1   ibuprofen (ADVIL) 800 MG tablet Take 1 tablet (800 mg total) by mouth every 8 (eight) hours as needed. 30 tablet 0   ipratropium-albuterol (DUONEB) 0.5-2.5 (3) MG/3ML SOLN Take 3 mLs by nebulization in the morning, at noon, in the evening, and at bedtime. (Patient taking differently: Take 3 mLs by nebulization every 4 (four) hours as needed (Asthma).) 360 mL 2   lidocaine (XYLOCAINE) 2 % solution Use as directed 15 mLs in the mouth or throat every 6 (six) hours as needed for mouth pain. 100 mL 0   metoprolol succinate (TOPROL-XL) 25 MG 24 hr tablet Take 1 tablet (25 mg total) by mouth 2 (two) times daily. 60 tablet 2   mometasone-formoterol (DULERA) 200-5 MCG/ACT AERO Take 2 puffs first thing in am and then another 2  puffs about 12 hours later. 1 each 11   montelukast (SINGULAIR) 10 MG tablet Take 1 tablet (10 mg total) by mouth at bedtime. 30 tablet 11   nirmatrelvir/ritonavir (PAXLOVID) 20 x 150 MG & 10 x 100MG  TABS Take 3 tablets by mouth 2 (two) times daily for 5 days. (Take nirmatrelvir 150 mg two tablets twice daily for 5 days and ritonavir 100 mg one tablet twice daily for 5 days) Patient GFR is >60 30 tablet 0   ondansetron (ZOFRAN) 4 MG tablet Take 1 tablet (4 mg total) by mouth every 8 (eight) hours as needed for nausea or vomiting. 20 tablet 0   ondansetron (ZOFRAN-ODT) 4 MG disintegrating tablet Take 1 tablet (4 mg total) by  mouth every 8 (eight) hours as needed for nausea or vomiting. 20 tablet 0   pantoprazole (PROTONIX) 40 MG tablet TAKE 1 TABLET(40 MG) BY MOUTH DAILY 90 tablet 0   triamcinolone cream (KENALOG) 0.1 % Apply 1 Application topically 2 (two) times daily. (Patient not taking: Reported on 04/09/2023) 80 g 0   Vitamin D, Ergocalciferol, (DRISDOL) 1.25 MG (50000 UNIT) CAPS capsule Take 1 capsule (50,000 Units total) by mouth every 7 (seven) days for 12 doses. 4 capsule 2   zolpidem (AMBIEN CR) 12.5 MG CR tablet Take 1 tablet (12.5 mg total) by mouth at bedtime. 30 tablet 2   No current facility-administered medications on file prior to visit.    Observations/Objective: Alert and oriented x 3. Not in acute distress. Physical examination not completed as this is a telemedicine visit.  Assessment and Plan: ***  Follow Up Instructions: ***   Patient was given clear instructions to go to Emergency Department or return to medical center if symptoms don't improve, worsen, or new problems develop.The patient verbalized understanding.  I discussed the assessment and treatment plan with the patient. The patient was provided an opportunity to ask questions and all were answered. The patient agreed with the plan and demonstrated an understanding of the instructions.   The patient was advised to call back or seek an in-person evaluation if the symptoms worsen or if the condition fails to improve as anticipated.     I provided *** minutes total of non-face-to-face time during this encounter.   Rema Fendt, NP  [facility] at [facility] [place], [place] [phone] 05/13/2023, 3:49 PM

## 2023-05-14 ENCOUNTER — Encounter: Payer: Self-pay | Admitting: Family

## 2023-05-14 ENCOUNTER — Telehealth (INDEPENDENT_AMBULATORY_CARE_PROVIDER_SITE_OTHER): Payer: BLUE CROSS/BLUE SHIELD | Admitting: Family

## 2023-05-14 DIAGNOSIS — U071 COVID-19: Secondary | ICD-10-CM | POA: Diagnosis not present

## 2023-05-14 LAB — CULTURE, GROUP A STREP (THRC)

## 2023-05-14 MED ORDER — PSEUDOEPH-BROMPHEN-DM 30-2-10 MG/5ML PO SYRP: 5.0000 mL | ORAL_SOLUTION | Freq: Four times a day (QID) | ORAL | 0 refills | Status: DC | PRN

## 2023-05-15 ENCOUNTER — Encounter: Payer: Self-pay | Admitting: Family

## 2023-05-16 ENCOUNTER — Encounter: Payer: Self-pay | Admitting: Family

## 2023-05-21 ENCOUNTER — Telehealth: Payer: BLUE CROSS/BLUE SHIELD | Admitting: Physician Assistant

## 2023-05-21 DIAGNOSIS — M549 Dorsalgia, unspecified: Secondary | ICD-10-CM

## 2023-05-21 NOTE — Progress Notes (Signed)
Because this is an ongoing issue and requiring work accommodations that we cannot give via e-visit, I feel your condition warrants further evaluation and I recommend that you be seen in a face to face visit. I would first recommend reaching out to your PCP to see if they can write for this until you see the specialist.    NOTE: There will be NO CHARGE for this eVisit   If you are having a true medical emergency please call 911.      For an urgent face to face visit, [place] has eight urgent care centers for your convenience:   NEW!! [facility] Urgent Care Center at [facility] Get Driving Directions [phone] [address] [place], [address]    [facility] Health Ur[facility] Care Center at [facility] Get Driving Directions [phone] [address] [place], [place] [address]   [facility] Urgent Care Center [facility]) Get Driving Directions [phone] [address] [place], [place] [address]  [facility] Urgent Care Center [facility]) Get Driving Directions [phone] [address] [place],  [place]  [address]  [facility] Health Urgent Care Center [facility] - at [employer]  [phone] [place],  [place] [address]   [facility] Health Urgent Care at [facility] Get Driving Directions [phone] [address] [place], [place] [address]   [facility] Health Urgent Care at [facility] Get Driving Directions  [phone] [address] [place], [place] [address]   [facility] Health Urgent Care at [facility] Directions [phone] [address] [place], [place] [address]  Your MyChart E-visit questionnaire answers were reviewed by a board certified advanced clinical practitioner to complete your personal care plan based on your specific symptoms.  Thank you for using e-Visits.

## 2023-05-29 ENCOUNTER — Other Ambulatory Visit: Payer: Self-pay | Admitting: Family

## 2023-05-29 DIAGNOSIS — J4540 Moderate persistent asthma, uncomplicated: Secondary | ICD-10-CM

## 2023-05-29 NOTE — Telephone Encounter (Signed)
Requested medication (s) are due for refill today - expired Rx  Requested medication (s) are on the active medication list -yes  Future visit scheduled -no  Last refill: 01/04/22 2RF  Notes to clinic: expired Rx  Requested Prescriptions  Pending Prescriptions Disp Refills   ipratropium-albuterol (DUONEB) 0.5-2.5 (3) MG/3ML SOLN [Pharmacy Med Name: IPRATROPI/ALB 0.5/3MG  INH SOLUTION] 360 mL 2    Sig: USE 1 VIAL VIA NEBULIZER EVERY MORNING, AT NOON, IN THE EVENING, AND AT BEDTIME     Pulmonology:  Combination Products - albuterol / ipratropium Passed - 05/29/2023  3:34 AM      Passed - Last BP in normal range    BP Readings from Last 1 Encounters:  05/11/23 92/66         Passed - Last Heart Rate in normal range    Pulse Readings from Last 1 Encounters:  05/11/23 (!) 105         Passed - Valid encounter within last 12 months    Recent Outpatient Visits           2 weeks ago COVID   [facility], Amy J, NP   1 month ago Recurrent acute suppurative otitis media with spontaneous rupture of left tympanic membrane   [place] [facility], MD   1 month ago Glossitis   [place] [facility], [place], NP   3 months ago Otalgia of both ears   [place] [facility] at [facility], Amy J, NP   5 months ago Degenerative disc disease at L5-S1 level   [facility] at [facility], Amy J, NP                 Requested Prescriptions  Pending Prescriptions Disp Refills   ipratropium-albuterol (DUONEB) 0.5-2.5 (3) MG/3ML SOLN [Pharmacy Med Name: IPRATROPI/ALB 0.5/3MG  INH SOLUTION] 360 mL 2    Sig: USE 1 VIAL VIA NEBULIZER EVERY MORNING, AT NOON, IN THE EVENING, AND AT BEDTIME     Pulmonology:  Combination Products - albuterol / ipratropium Passed - 05/29/2023  3:34 AM      Passed - Last BP in normal range    BP Readings from Last 1  Encounters:  05/11/23 92/66         Passed - Last Heart Rate in normal range    Pulse Readings from Last 1 Encounters:  05/11/23 (!) 105         Passed - Valid encounter within last 12 months    Recent Outpatient Visits           2 weeks ago COVID   [facility], Amy J, NP   1 month ago Recurrent acute suppurative otitis media with spontaneous rupture of left tympanic membrane   [place] [facility], MD   1 month ago Glossitis   [place] [facility], [place], NP   3 months ago Otalgia of both ears   [place] [facility] at [facility], Amy J, NP   5 months ago Degenerative disc disease at L5-S1 level   [facility] at [facility], Salomon Fick, NP

## 2023-06-06 ENCOUNTER — Other Ambulatory Visit: Payer: Self-pay

## 2023-06-06 DIAGNOSIS — J4540 Moderate persistent asthma, uncomplicated: Secondary | ICD-10-CM

## 2023-06-06 MED ORDER — IPRATROPIUM-ALBUTEROL 0.5-2.5 (3) MG/3ML IN SOLN: 3.0000 mL | Freq: Four times a day (QID) | RESPIRATORY_TRACT | 2 refills | Status: AC

## 2023-06-23 ENCOUNTER — Telehealth: Payer: BLUE CROSS/BLUE SHIELD | Admitting: Family Medicine

## 2023-06-23 DIAGNOSIS — M549 Dorsalgia, unspecified: Secondary | ICD-10-CM | POA: Diagnosis not present

## 2023-06-23 MED ORDER — TIZANIDINE HCL 2 MG PO TABS: 2.0000 mg | ORAL_TABLET | Freq: Three times a day (TID) | ORAL | 0 refills | Status: AC | PRN

## 2023-06-23 NOTE — Progress Notes (Signed)
We are sorry that you are not feeling well.  Here is how we plan to help!  Based on what you have shared with me it looks like you mostly have acute back pain.  Acute back pain is defined as musculoskeletal pain that can resolve in 1-3 weeks with conservative treatment.  I have prescribed  non-steroid anti-inflammatory (NSAID) as well as Tizanidine 2 mg every eight hours as needed which is a muscle relaxer  Some patients experience stomach irritation or in increased heartburn with anti-inflammatory drugs.  Please keep in mind that muscle relaxer's can cause fatigue and should not be taken while at work or driving.  Back pain is very common.  The pain often gets better over time.  The cause of back pain is usually not dangerous.  Most people can learn to manage their back pain on their own.  Home Care Stay active.  Start with short walks on flat ground if you can.  Try to walk farther each day. Do not sit, drive or stand in one place for more than 30 minutes.  Do not stay in bed. Do not avoid exercise or work.  Activity can help your back heal faster. Be careful when you bend or lift an object.  Bend at your knees, keep the object close to you, and do not twist. Sleep on a firm mattress.  Lie on your side, and bend your knees.  If you lie on your back, put a pillow under your knees. Only take medicines as told by your doctor. Put ice on the injured area. Put ice in a plastic bag Place a towel between your skin and the bag Leave the ice on for 15-20 minutes, 3-4 times a day for the first 2-3 days. 210 After that, you can switch between ice and heat packs. Ask your doctor about back exercises or massage. Avoid feeling anxious or stressed.  Find good ways to deal with stress, such as exercise.  Get Help Right Way If: Your pain does not go away with rest or medicine. Your pain does not go away in 1 week. You have new problems. You do not feel well. The pain spreads into your legs. You cannot  control when you poop (bowel movement) or pee (urinate) You feel sick to your stomach (nauseous) or throw up (vomit) You have belly (abdominal) pain. You feel like you may pass out (faint). If you develop a fever.  Make Sure you: Understand these instructions. Will watch your condition Will get help right away if you are not doing well or get worse.  Your e-visit answers were reviewed by a board certified advanced clinical practitioner to complete your personal care plan.  Depending on the condition, your plan could have included both over the counter or prescription medications.  If there is a problem please reply  once you have received a response from your provider.  Your safety is important to [place].  If you have drug allergies check your prescription carefully.    You can use MyChart to ask questions about today's visit, request a non-urgent call back, or ask for a work or school excuse for 24 hours related to this e-Visit. If it has been greater than 24 hours you will need to follow up with your provider, or enter a new e-Visit to address those concerns.  You will get an e-mail in the next two days asking about your experience.  I hope that your e-visit has been valuable and will speed your  recovery. Thank you for using e-visits.    have provided 5 minutes of non face to face time during this encounter for chart review and documentation.

## 2023-06-26 ENCOUNTER — Other Ambulatory Visit: Payer: Self-pay | Admitting: Pulmonary Disease

## 2023-06-26 ENCOUNTER — Telehealth: Payer: Self-pay | Admitting: Family

## 2023-06-26 MED ORDER — ZOLPIDEM TARTRATE ER 12.5 MG PO TBCR: 12.5000 mg | EXTENDED_RELEASE_TABLET | Freq: Every day | ORAL | 2 refills | Status: DC

## 2023-06-26 NOTE — Telephone Encounter (Signed)
Called Pt, went straight to VM, VM left to call the office back.

## 2023-06-28 NOTE — Telephone Encounter (Signed)
Called Pt, reached VM, Vm is full.

## 2023-07-01 ENCOUNTER — Telehealth: Payer: BLUE CROSS/BLUE SHIELD | Admitting: Physician Assistant

## 2023-07-01 DIAGNOSIS — J020 Streptococcal pharyngitis: Secondary | ICD-10-CM | POA: Diagnosis not present

## 2023-07-01 MED ORDER — IBUPROFEN 800 MG PO TABS: 800.0000 mg | ORAL_TABLET | Freq: Three times a day (TID) | ORAL | 0 refills | Status: AC | PRN

## 2023-07-01 MED ORDER — CLINDAMYCIN HCL 300 MG PO CAPS: 300.0000 mg | ORAL_CAPSULE | Freq: Three times a day (TID) | ORAL | 0 refills | Status: AC

## 2023-07-01 NOTE — Addendum Note (Signed)
Addended by: Tiara Maultsby M on: 07/01/2023 04:54 PM   Modules accepted: Level of Service  

## 2023-07-01 NOTE — Progress Notes (Signed)

## 2023-07-02 ENCOUNTER — Telehealth: Payer: Self-pay | Admitting: Family

## 2023-07-02 NOTE — Telephone Encounter (Signed)
Tina Manning scheduled her for a lab appointment.

## 2023-07-02 NOTE — Telephone Encounter (Signed)
Called pt to make an appt to check Vitamin D levels per nurse A.S.; could not reach pt and could not leave voicemail because it was full. Tried to contact pt's listed DPR, husband, but phone is not working.

## 2023-07-03 ENCOUNTER — Other Ambulatory Visit: Payer: Self-pay | Admitting: Nurse Practitioner

## 2023-07-03 DIAGNOSIS — R110 Nausea: Secondary | ICD-10-CM

## 2023-07-03 MED ORDER — ONDANSETRON 4 MG PO TBDP: 4.0000 mg | ORAL_TABLET | Freq: Three times a day (TID) | ORAL | 0 refills | Status: AC | PRN

## 2023-07-03 NOTE — Progress Notes (Signed)
Patient requested nausea medication to help with nausea associated with antibitoics prescribed in previous E-visit    . Meds ordered this encounter  Medications   ondansetron (ZOFRAN-ODT) 4 MG disintegrating tablet    Sig: Take 1 tablet (4 mg total) by mouth every 8 (eight) hours as needed for nausea or vomiting.    Dispense:  20 tablet    Refill:  0

## 2023-07-05 ENCOUNTER — Encounter: Payer: Self-pay | Admitting: Family

## 2023-07-05 ENCOUNTER — Encounter: Payer: Self-pay | Admitting: Internal Medicine

## 2023-07-08 ENCOUNTER — Other Ambulatory Visit: Payer: Self-pay | Admitting: Family

## 2023-07-08 DIAGNOSIS — I10 Essential (primary) hypertension: Secondary | ICD-10-CM

## 2023-07-08 DIAGNOSIS — J4520 Mild intermittent asthma, uncomplicated: Secondary | ICD-10-CM

## 2023-07-08 DIAGNOSIS — R000 Tachycardia, unspecified: Secondary | ICD-10-CM

## 2023-07-08 DIAGNOSIS — K219 Gastro-esophageal reflux disease without esophagitis: Secondary | ICD-10-CM

## 2023-07-08 MED ORDER — ALBUTEROL SULFATE HFA 108 (90 BASE) MCG/ACT IN AERS: INHALATION_SPRAY | RESPIRATORY_TRACT | 3 refills | Status: AC

## 2023-07-08 MED ORDER — PANTOPRAZOLE SODIUM 40 MG PO TBEC: DELAYED_RELEASE_TABLET | ORAL | 0 refills | Status: DC

## 2023-07-08 MED ORDER — METOPROLOL SUCCINATE ER 25 MG PO TB24: 25.0000 mg | ORAL_TABLET | Freq: Two times a day (BID) | ORAL | 0 refills | Status: AC

## 2023-07-08 NOTE — Telephone Encounter (Signed)
Complete

## 2023-07-09 ENCOUNTER — Other Ambulatory Visit: Payer: Self-pay

## 2023-07-09 DIAGNOSIS — J189 Pneumonia, unspecified organism: Secondary | ICD-10-CM

## 2023-07-09 DIAGNOSIS — J4551 Severe persistent asthma with (acute) exacerbation: Secondary | ICD-10-CM

## 2023-07-10 ENCOUNTER — Other Ambulatory Visit: Payer: Self-pay

## 2023-07-10 ENCOUNTER — Other Ambulatory Visit: Payer: Self-pay | Admitting: Pulmonary Disease

## 2023-07-10 ENCOUNTER — Other Ambulatory Visit: Payer: BLUE CROSS/BLUE SHIELD

## 2023-07-10 DIAGNOSIS — J189 Pneumonia, unspecified organism: Secondary | ICD-10-CM

## 2023-07-10 DIAGNOSIS — J4551 Severe persistent asthma with (acute) exacerbation: Secondary | ICD-10-CM

## 2023-07-10 MED ORDER — ZOLPIDEM TARTRATE ER 12.5 MG PO TBCR: 12.5000 mg | EXTENDED_RELEASE_TABLET | Freq: Every day | ORAL | 2 refills | Status: AC

## 2023-07-10 MED ORDER — MOMETASONE FURO-FORMOTEROL FUM 200-5 MCG/ACT IN AERO: INHALATION_SPRAY | RESPIRATORY_TRACT | 11 refills | Status: AC

## 2023-07-10 MED ORDER — BREZTRI AEROSPHERE 160-9-4.8 MCG/ACT IN AERO: 2.0000 | INHALATION_SPRAY | Freq: Two times a day (BID) | RESPIRATORY_TRACT | Status: AC

## 2023-07-15 ENCOUNTER — Telehealth: Payer: BLUE CROSS/BLUE SHIELD | Admitting: Physician Assistant

## 2023-07-15 DIAGNOSIS — B369 Superficial mycosis, unspecified: Secondary | ICD-10-CM

## 2023-07-15 DIAGNOSIS — L309 Dermatitis, unspecified: Secondary | ICD-10-CM | POA: Diagnosis not present

## 2023-07-15 MED ORDER — NYSTATIN 100000 UNIT/GM EX CREA
1.0000 | TOPICAL_CREAM | Freq: Two times a day (BID) | CUTANEOUS | 0 refills | Status: DC
Start: 2023-07-15 — End: 2024-01-10

## 2023-07-15 MED ORDER — PREDNISONE 10 MG PO TABS: ORAL_TABLET | ORAL | 0 refills | Status: DC

## 2023-07-15 NOTE — Progress Notes (Signed)
E Visit for Rash  We are sorry that you are not feeling well. Here is how we plan to help!  To calm down the rash and itching: I am prescribing a two week course of steroids (37 tablets of 10 mg prednisone).  Days 1-4 take 4 tablets (40 mg) daily  Days 5-8 take 3 tablets (30 mg) daily, Days 9-11 take 2 tablets (20 mg) daily, Days 12-14 take 1 tablet (10 mg) daily.    This rash may be an extension of a yeast or fungal infection so we will also prescribe an anti-fungal medication to try as well: Nystatin cream apply to the affected area twice daily  If you do not have improvement with this we would suggest in person evaluation for a second opinion   HOME CARE:  Take cool showers and avoid direct sunlight. Apply cool compress or wet dressings. Take a bath in an oatmeal bath.  Sprinkle content of one Aveeno packet under running faucet with comfortably warm water.  Bathe for 15-20 minutes, 1-2 times daily.  Pat dry with a towel. Do not rub the rash. Use hydrocortisone cream. Take an antihistamine like Benadryl for widespread rashes that itch.  The adult dose of Benadryl is 25-50 mg by mouth 4 times daily. Caution:  This type of medication may cause sleepiness.  Do not drink alcohol, drive, or operate dangerous machinery while taking antihistamines.  Do not take these medications if you have prostate enlargement.  Read package instructions thoroughly on all medications that you take.  GET HELP RIGHT AWAY IF:  Symptoms don't go away after treatment. Severe itching that persists. If you rash spreads or swells. If you rash begins to smell. If it blisters and opens or develops a yellow-brown crust. You develop a fever. You have a sore throat. You become short of breath.  MAKE SURE YOU:  Understand these instructions. Will watch your condition. Will get help right away if you are not doing well or get worse.  Thank you for choosing an e-visit.  Your e-visit answers were reviewed by a  board certified advanced clinical practitioner to complete your personal care plan. Depending upon the condition, your plan could have included both over the counter or prescription medications.  Please review your pharmacy choice. Make sure the pharmacy is open so you can pick up prescription now. If there is a problem, you may contact your provider through [employer] and have the prescription routed to another pharmacy.  Your safety is important to [place]. If you have drug allergies check your prescription carefully.   For the next 24 hours you can use MyChart to ask questions about today's visit, request a non-urgent call back, or ask for a work or school excuse. You will get an email in the next two days asking about your experience. I hope that your e-visit has been valuable and will speed your recovery.   Meds ordered this encounter  Medications   predniSONE (DELTASONE) 10 MG tablet    Sig: Take 4 tablets (40mg ) on days 1-4, then 3 tablets (30mg ) on days 5-8, then 2 tablets (20mg ) on days 9-11, then 1 tablet daily for days 12-14. Take with food.    Dispense:  37 tablet    Refill:  0   nystatin cream (MYCOSTATIN)    Sig: Apply 1 Application topically 2 (two) times daily.    Dispense:  30 g    Refill:  0    I spent approximately 5 minutes reviewing the  patient's history, current symptoms and coordinating their care today.

## 2023-07-26 ENCOUNTER — Telehealth: Payer: BLUE CROSS/BLUE SHIELD | Admitting: Family Medicine

## 2023-07-26 DIAGNOSIS — H9209 Otalgia, unspecified ear: Secondary | ICD-10-CM

## 2023-07-26 NOTE — Progress Notes (Signed)
   [place]   Out of state in [place]

## 2023-08-13 ENCOUNTER — Telehealth: Payer: BLUE CROSS/BLUE SHIELD | Admitting: Emergency Medicine

## 2023-08-13 DIAGNOSIS — L259 Unspecified contact dermatitis, unspecified cause: Secondary | ICD-10-CM

## 2023-08-13 MED ORDER — TRIAMCINOLONE ACETONIDE 0.1 % EX CREA: 1.0000 | TOPICAL_CREAM | Freq: Two times a day (BID) | CUTANEOUS | 0 refills | Status: AC

## 2023-08-13 NOTE — Progress Notes (Signed)
E Visit for Rash  We are sorry that you are not feeling well. Here is how we plan to help!  Based on what you shared with me it looks like you have contact dermatitis.  Contact dermatitis is a skin rash caused by something that touches the skin and causes irritation or inflammation.  Your skin may be red, swollen, dry, cracked, and itch.  The rash should go away in a few days but can last a few weeks.  If you get a rash, it's important to figure out what caused it so the irritant can be avoided in the future.  I have prescribed a steroid cream for you to apply to the rash: triamcinolone. I recommend switching from benadryl cream to oral benadryl, but some people find benadryl really sedating. You can use zyrtec or claritin (or the generic versions) instead.   HOME CARE:  Take cool showers and avoid direct sunlight. Apply cool compress or wet dressings. Take a bath in an oatmeal bath.  Sprinkle content of one Aveeno packet under running faucet with comfortably warm water.  Bathe for 15-20 minutes, 1-2 times daily.  Pat dry with a towel. Do not rub the rash. Use hydrocortisone cream. Take an antihistamine like Benadryl for widespread rashes that itch.  The adult dose of Benadryl is 25-50 mg by mouth 4 times daily. Caution:  This type of medication may cause sleepiness.  Do not drink alcohol, drive, or operate dangerous machinery while taking antihistamines.  Do not take these medications if you have prostate enlargement.  Read package instructions thoroughly on all medications that you take.  GET HELP RIGHT AWAY IF:  Symptoms don't go away after treatment. Severe itching that persists. If you rash spreads or swells. If you rash begins to smell. If it blisters and opens or develops a yellow-brown crust. You develop a fever. You have a sore throat. You become short of breath.  MAKE SURE YOU:  Understand these instructions. Will watch your condition. Will get help right away if you are not  doing well or get worse.  Thank you for choosing an e-visit.  Your e-visit answers were reviewed by a board certified advanced clinical practitioner to complete your personal care plan. Depending upon the condition, your plan could have included both over the counter or prescription medications.  Please review your pharmacy choice. Make sure the pharmacy is open so you can pick up prescription now. If there is a problem, you may contact your provider through [employer] and have the prescription routed to another pharmacy.  Your safety is important to [place]. If you have drug allergies check your prescription carefully.   For the next 24 hours you can use MyChart to ask questions about today's visit, request a non-urgent call back, or ask for a work or school excuse. You will get an email in the next two days asking about your experience. I hope that your e-visit has been valuable and will speed your recovery.  I have spent 5 minutes in review of e-visit questionnaire, review and updating patient chart, medical decision making and response to patient.   Rica Mast, PhD, FNP-BC

## 2023-08-19 ENCOUNTER — Telehealth: Payer: BLUE CROSS/BLUE SHIELD | Admitting: Nurse Practitioner

## 2023-08-19 DIAGNOSIS — G8929 Other chronic pain: Secondary | ICD-10-CM

## 2023-08-19 NOTE — Progress Notes (Signed)
Tina Manning,  I apologize for the inconvenience but E-visits are designed for acute illness or injury. We cannot manage chronic medical conditions at this time. We encourage you to reach out to the prescribing provider for a refill, or visit an Urgent Care if needed   I feel your condition warrants further evaluation and I recommend that you be seen in a face to face visit.   NOTE: There will be NO CHARGE for this eVisit   If you are having a true medical emergency please call 911.      For an urgent face to face visit, [facility] has eight urgent care centers for your convenience:   NEW!! [facility] at [facility] Get Driving Directions [phone] [address] [place], [address]    [facility] Health Urgent Care Center at [facility] Get Driving Directions [phone] [address] [place], [place] [address]   [facility] Health Urgent Care Center [facility]) Get Driving Directions [phone] [address] [place], [place] [address]  [facility] Health Urgent Care Center [facility]) Get Driving Directions [phone] [address] [place],  [place]  [address]  [facility] Health Urgent Care Center [facility] - at [employer]  [phone] [address] [place],  [place] [address]   [facility] Health Urgent Care at [facility] Get Driving Directions [phone] [address] [place], [place] [address]   [facility] Health Urgent Care at [facility] Get Driving Directions  [phone] [address] [place], [place] [address]   [facility] Health Urgent Care at [facility] Directions [phone] [address] [place], [place] [address]  Your MyChart E-visit questionnaire answers were reviewed by a board certified advanced clinical practitioner to complete your personal care plan based on your specific symptoms.  Thank  you for using e-Visits.

## 2023-09-24 ENCOUNTER — Encounter: Payer: Self-pay | Admitting: Internal Medicine

## 2023-09-24 DIAGNOSIS — J4540 Moderate persistent asthma, uncomplicated: Secondary | ICD-10-CM

## 2023-09-24 NOTE — Telephone Encounter (Signed)
Ok to place referral.

## 2023-09-25 NOTE — Telephone Encounter (Signed)
Ok to refer as requested for moderate persistent asthma, with last office note

## 2023-10-11 ENCOUNTER — Other Ambulatory Visit: Payer: Self-pay

## 2023-10-11 DIAGNOSIS — K219 Gastro-esophageal reflux disease without esophagitis: Secondary | ICD-10-CM

## 2023-10-11 MED ORDER — PANTOPRAZOLE SODIUM 40 MG PO TBEC: DELAYED_RELEASE_TABLET | ORAL | 0 refills | Status: AC

## 2024-01-10 ENCOUNTER — Telehealth: Payer: BLUE CROSS/BLUE SHIELD | Admitting: Physician Assistant

## 2024-01-10 DIAGNOSIS — G8929 Other chronic pain: Secondary | ICD-10-CM

## 2024-01-10 NOTE — Progress Notes (Signed)
Patient is out of state.  I have spent 5 minutes in review of e-visit questionnaire, review and updating patient chart, medical decision making and response to patient.   Margaretann Loveless, PA-C
